# Patient Record
Sex: Male | Born: 1980 | Race: White | Hispanic: No | Marital: Married | Smoking: Never smoker
Health system: Southern US, Community
[De-identification: ages and names within clinical notes are randomized; demographics above are authoritative.]

## PROBLEM LIST (undated history)

## (undated) ENCOUNTER — Ambulatory Visit: Payer: PRIVATE HEALTH INSURANCE

## (undated) ENCOUNTER — Encounter

## (undated) ENCOUNTER — Ambulatory Visit

## (undated) ENCOUNTER — Encounter: Attending: Gastroenterology | Primary: Gastroenterology

## (undated) ENCOUNTER — Telehealth

## (undated) ENCOUNTER — Ambulatory Visit: Payer: PRIVATE HEALTH INSURANCE | Attending: Gastroenterology | Primary: Gastroenterology

## (undated) ENCOUNTER — Telehealth
Attending: Student in an Organized Health Care Education/Training Program | Primary: Student in an Organized Health Care Education/Training Program

## (undated) ENCOUNTER — Encounter: Attending: Certified Registered" | Primary: Certified Registered"

## (undated) ENCOUNTER — Ambulatory Visit: Attending: Family | Primary: Family

## (undated) ENCOUNTER — Ambulatory Visit: Attending: Internal Medicine | Primary: Internal Medicine

## (undated) DIAGNOSIS — K760 Fatty (change of) liver, not elsewhere classified: Secondary | ICD-10-CM

## (undated) DIAGNOSIS — K219 Gastro-esophageal reflux disease without esophagitis: Secondary | ICD-10-CM

## (undated) DIAGNOSIS — K7460 Unspecified cirrhosis of liver: Secondary | ICD-10-CM

## (undated) DIAGNOSIS — E669 Obesity, unspecified: Secondary | ICD-10-CM

## (undated) DIAGNOSIS — E119 Type 2 diabetes mellitus without complications: Secondary | ICD-10-CM

## (undated) DIAGNOSIS — K8020 Calculus of gallbladder without cholecystitis without obstruction: Secondary | ICD-10-CM

## (undated) HISTORY — DX: Calculus of gallbladder without cholecystitis without obstruction: K80.20

## (undated) HISTORY — DX: Fatty (change of) liver, not elsewhere classified: K76.0

## (undated) HISTORY — PX: BACK SURGERY: SHX140

## (undated) HISTORY — DX: Obesity, unspecified: E66.9

## (undated) HISTORY — PX: CHOLECYSTECTOMY: SHX55

## (undated) HISTORY — DX: Type 2 diabetes mellitus without complications: E11.9

## (undated) HISTORY — DX: Gastro-esophageal reflux disease without esophagitis: K21.9

---

## 2015-10-13 ENCOUNTER — Emergency Department (HOSPITAL_COMMUNITY): Payer: 59

## 2015-10-13 ENCOUNTER — Emergency Department (HOSPITAL_COMMUNITY)
Admission: EM | Admit: 2015-10-13 | Discharge: 2015-10-13 | Disposition: A | Discharge status: Home / Self Care | Payer: 59 | Attending: Emergency Medicine | Admitting: Emergency Medicine

## 2015-10-13 ENCOUNTER — Encounter (HOSPITAL_COMMUNITY): Payer: Self-pay | Admitting: Emergency Medicine

## 2015-10-13 DIAGNOSIS — J159 Unspecified bacterial pneumonia: Secondary | ICD-10-CM | POA: Insufficient documentation

## 2015-10-13 DIAGNOSIS — R05 Cough: Secondary | ICD-10-CM | POA: Diagnosis present

## 2015-10-13 DIAGNOSIS — Z79899 Other long term (current) drug therapy: Secondary | ICD-10-CM | POA: Diagnosis not present

## 2015-10-13 DIAGNOSIS — J189 Pneumonia, unspecified organism: Secondary | ICD-10-CM

## 2015-10-13 LAB — COMPREHENSIVE METABOLIC PANEL
ALBUMIN: 4.5 g/dL (ref 3.5–5.0)
ALT: 54 U/L (ref 17–63)
ANION GAP: 9 (ref 5–15)
AST: 47 U/L — AB (ref 15–41)
Alkaline Phosphatase: 89 U/L (ref 38–126)
BILIRUBIN TOTAL: 1 mg/dL (ref 0.3–1.2)
BUN: 14 mg/dL (ref 6–20)
CO2: 22 mmol/L (ref 22–32)
Calcium: 9.5 mg/dL (ref 8.9–10.3)
Chloride: 103 mmol/L (ref 101–111)
Creatinine, Ser: 0.93 mg/dL (ref 0.61–1.24)
GFR calc Af Amer: >60 mL/min (ref >60)
GFR calc non Af Amer: >60 mL/min (ref >60)
GLUCOSE: 149 mg/dL — AB (ref 65–99)
POTASSIUM: 4 mmol/L (ref 3.5–5.1)
SODIUM: 134 mmol/L — AB (ref 135–145)
Total Protein: 8.3 g/dL — ABNORMAL HIGH (ref 6.5–8.1)

## 2015-10-13 LAB — CBC WITH DIFFERENTIAL/PLATELET
BASOS ABS: 0 10*3/uL (ref 0.0–0.1)
BASOS PCT: 0 %
EOS ABS: 0.1 10*3/uL (ref 0.0–0.7)
Eosinophils Relative: 1 %
HEMATOCRIT: 47.7 % (ref 39.0–52.0)
HEMOGLOBIN: 16.9 g/dL (ref 13.0–17.0)
Lymphocytes Relative: 10 %
Lymphs Abs: 1.1 10*3/uL (ref 0.7–4.0)
MCH: 31 pg (ref 26.0–34.0)
MCHC: 35.4 g/dL (ref 30.0–36.0)
MCV: 87.5 fL (ref 78.0–100.0)
MONO ABS: 1.1 10*3/uL — AB (ref 0.1–1.0)
MONOS PCT: 11 %
NEUTROS ABS: 8.2 10*3/uL — AB (ref 1.7–7.7)
NEUTROS PCT: 78 %
Platelets: 219 10*3/uL (ref 150–400)
RBC: 5.45 MIL/uL (ref 4.22–5.81)
RDW: 12.7 % (ref 11.5–15.5)
WBC: 10.5 10*3/uL (ref 4.0–10.5)

## 2015-10-13 LAB — URINALYSIS, ROUTINE W REFLEX MICROSCOPIC
Bilirubin Urine: NEGATIVE
GLUCOSE, UA: NEGATIVE mg/dL
Hgb urine dipstick: NEGATIVE
Ketones, ur: NEGATIVE mg/dL
LEUKOCYTES UA: NEGATIVE
NITRITE: NEGATIVE
PH: 6 (ref 5.0–8.0)
Protein, ur: NEGATIVE mg/dL
SPECIFIC GRAVITY, URINE: 1.016 (ref 1.005–1.030)

## 2015-10-13 LAB — D-DIMER, QUANTITATIVE: D-Dimer, Quant: 0.54 ug/mL-FEU — ABNORMAL HIGH (ref 0.00–0.50)

## 2015-10-13 LAB — LIPASE, BLOOD: Lipase: 36 U/L (ref 11–51)

## 2015-10-13 MED ORDER — KETOROLAC TROMETHAMINE 30 MG/ML IJ SOLN
30.0000 mg | Freq: Once | INTRAMUSCULAR | Status: AC
  Administered 2015-10-13: 30 mg via INTRAVENOUS
  Filled 2015-10-13: qty 1

## 2015-10-13 MED ORDER — LEVOFLOXACIN 750 MG PO TABS
750.0000 mg | ORAL_TABLET | Freq: Once | ORAL | Status: AC
  Administered 2015-10-13: 750 mg via ORAL
  Filled 2015-10-13: qty 1

## 2015-10-13 MED ORDER — LEVOFLOXACIN 500 MG PO TABS: 500.0000 mg | ORAL_TABLET | Freq: Every day | ORAL | Status: DC

## 2015-10-13 MED ORDER — IOHEXOL 350 MG/ML SOLN
100.0000 mL | Freq: Once | INTRAVENOUS | Status: AC | PRN
  Administered 2015-10-13: 100 mL via INTRAVENOUS

## 2015-10-13 NOTE — ED Notes (Addendum)
Pt from PCP due to right abdominal pain and right shoulder pain that started yesterday with "a dull ache in my shoulder that got progressively". "My side hurts when I take a deep breath".  Denies n/v/d. Pt ambulatory and in NAD.

## 2015-10-13 NOTE — Discharge Instructions (Signed)
Community-Acquired Pneumonia, Adult Pneumonia is an infection of the lungs. There are different types of pneumonia. One type can develop while a person is in a hospital. A different type, called community-acquired pneumonia, develops in people who are not, or have not recently been, in the hospital or other health care facility.  CAUSES Pneumonia may be caused by bacteria, viruses, or funguses. Community-acquired pneumonia is often caused by Streptococcus pneumonia bacteria. These bacteria are often passed from one person to another by breathing in droplets from the cough or sneeze of an infected person. RISK FACTORS The condition is more likely to develop in:  People who havechronic diseases, such as chronic obstructive pulmonary disease (COPD), asthma, congestive heart failure, cystic fibrosis, diabetes, or kidney disease.  People who haveearly-stage or late-stage HIV.  People who havesickle cell disease.  People who havehad their spleen removed (splenectomy).  People who havepoor Human resources officer.  People who havemedical conditions that increase the risk of breathing in (aspirating) secretions their own mouth and nose.   People who havea weakened immune system (immunocompromised).  People who smoke.  People whotravel to areas where pneumonia-causing germs commonly exist.  People whoare around animal habitats or animals that have pneumonia-causing germs, including birds, bats, rabbits, cats, and farm animals. SYMPTOMS Symptoms of this condition include:  Adry cough.  A wet (productive) cough.  Fever.  Sweating.  Chest pain, especially when breathing deeply or coughing.  Rapid breathing or difficulty breathing.  Shortness of breath.  Shaking chills.  Fatigue.  Muscle aches. DIAGNOSIS Your health care provider will take a medical history and perform a physical exam. You may also have other tests, including:  Imaging studies of your chest, including  X-rays.  Tests to check your blood oxygen level and other blood gases.  Other tests on blood, mucus (sputum), fluid around your lungs (pleural fluid), and urine. If your pneumonia is severe, other tests may be done to identify the specific cause of your illness. TREATMENT The type of treatment that you receive depends on many factors, such as the cause of your pneumonia, the medicines you take, and other medical conditions that you have. For most adults, treatment and recovery from pneumonia may occur at home. In some cases, treatment must happen in a hospital. Treatment may include:  Antibiotic medicines, if the pneumonia was caused by bacteria.  Antiviral medicines, if the pneumonia was caused by a virus.  Medicines that are given by mouth or through an IV tube.  Oxygen.  Respiratory therapy. Although rare, treating severe pneumonia may include:  Mechanical ventilation. This is done if you are not breathing well on your own and you cannot maintain a safe blood oxygen level.  Thoracentesis. This procedureremoves fluid around one lung or both lungs to help you breathe better. HOME CARE INSTRUCTIONS  Take over-the-counter and prescription medicines only as told by your health care provider.  Only takecough medicine if you are losing sleep. Understand that cough medicine can prevent your body's natural ability to remove mucus from your lungs.  If you were prescribed an antibiotic medicine, take it as told by your health care provider. Do not stop taking the antibiotic even if you start to feel better.  Sleep in a semi-upright position at night. Try sleeping in a reclining chair, or place a few pillows under your head.  Do not use tobacco products, including cigarettes, chewing tobacco, and e-cigarettes. If you need help quitting, ask your health care provider.  Drink enough water to keep your urine  clear or pale yellow. This will help to thin out mucus secretions in your  lungs. PREVENTION There are ways that you can decrease your risk of developing community-acquired pneumonia. Consider getting a pneumococcal vaccine if:  You are older than 36 years of age.  You are older than 36 years of age and are undergoing cancer treatment, have chronic lung disease, or have other medical conditions that affect your immune system. Ask your health care provider if this applies to you. There are different types and schedules of pneumococcal vaccines. Ask your health care provider which vaccination option is best for you. You may also prevent community-acquired pneumonia if you take these actions:  Get an influenza vaccine every year. Ask your health care provider which type of influenza vaccine is best for you.  Go to the dentist on a regular basis.  Wash your hands often. Use hand sanitizer if soap and water are not available. SEEK MEDICAL CARE IF:  You have a fever.  You are losing sleep because you cannot control your cough with cough medicine. SEEK IMMEDIATE MEDICAL CARE IF:  You have worsening shortness of breath.  You have increased chest pain.  Your sickness becomes worse, especially if you are an older adult or have a weakened immune system.  You cough up blood.   This information is not intended to replace advice given to you by your health care provider. Make sure you discuss any questions you have with your health care provider.   Document Released: 10/23/2005 Document Revised: 07/14/2015 Document Reviewed: 02/17/2015 Elsevier Interactive Patient Education 2016 [employer].  Chartered certified accountant Patient Education [employer].

## 2015-10-13 NOTE — ED Provider Notes (Signed)
CSN: [id number]     Arrival date & time 10/13/15  1044 History   First MD Initiated Contact with Patient 10/13/15 1231     No chief complaint on file.     HPI  Patient presents evaluation of right anterolateral inferior chest pain. Started having some pain yesterday in his shoulder. He has pleuritic pain and states it hurts to cough breathe or move. Has had a cough and had a cold-like illness last week. No fevers. Dry nonproductive cough. No hemoptysis. History of heart disease. No risk factors or history of pulmonary embolus. Does not have food intolerance or right upper quadrant pain.  History reviewed. No pertinent past medical history. History reviewed. No pertinent past surgical history. No family history on file. Social History  Substance Use Topics  . Smoking status: Never Smoker   . Smokeless tobacco: None  . Alcohol Use: Yes    Review of Systems  Constitutional: Negative for fever, chills, diaphoresis, appetite change and fatigue.  HENT: Negative for mouth sores, sore throat and trouble swallowing.   Eyes: Negative for visual disturbance.  Respiratory: Negative for cough, chest tightness, shortness of breath and wheezing.   Cardiovascular: Positive for chest pain.  Gastrointestinal: Negative for nausea, vomiting, abdominal pain, diarrhea and abdominal distention.  Endocrine: Negative for polydipsia, polyphagia and polyuria.  Genitourinary: Negative for dysuria, frequency and hematuria.  Musculoskeletal: Negative for gait problem.  Skin: Negative for color change, pallor and rash.  Neurological: Negative for dizziness, syncope, light-headedness and headaches.  Hematological: Does not bruise/bleed easily.  Psychiatric/Behavioral: Negative for behavioral problems and confusion.      Allergies  Review of patient's allergies indicates no known allergies.  Home Medications   Prior to Admission medications   Medication Sig Start Date End Date Taking? Authorizing  Provider  ibuprofen (ADVIL,MOTRIN) 200 MG tablet Take 400 mg by mouth every 6 (six) hours as needed for headache, mild pain or moderate pain.   Yes Historical Provider, MD  Multiple Vitamins-Minerals (MULTIVITAMIN ADULT) TABS Take 1 tablet by mouth daily.   Yes Historical Provider, MD  levofloxacin (LEVAQUIN) 500 MG tablet Take 1 tablet (500 mg total) by mouth daily. 10/13/15   Tanna Furry, MD   BP 131/81 mmHg  Pulse 79  Temp(Src) 99.4 F (37.4 C) (Oral)  Resp 15  Ht 5\' 8"  (1.727 m)  Wt 259 lb (117.482 kg)  BMI 39.39 kg/m2  SpO2 98% Physical Exam  Constitutional: He is oriented to person, place, and time. He appears well-developed and well-nourished. No distress.  HENT:  Head: Normocephalic.  Eyes: Conjunctivae are normal. Pupils are equal, round, and reactive to light. No scleral icterus.  Neck: Normal range of motion. Neck supple. No thyromegaly present.  Cardiovascular: Normal rate and regular rhythm.  Exam reveals no gallop and no friction rub.   No murmur heard. Pulmonary/Chest: Effort normal and breath sounds normal. No respiratory distress. He has no wheezes. He has no rales.    Abdominal: Soft. Bowel sounds are normal. He exhibits no distension. There is no tenderness. There is no rebound.    Musculoskeletal: Normal range of motion.  Neurological: He is alert and oriented to person, place, and time.  Skin: Skin is warm and dry. No rash noted.  Psychiatric: He has a normal mood and affect. His behavior is normal.    ED Course  Procedures (including critical care time) Labs Review Labs Reviewed  CBC WITH DIFFERENTIAL/PLATELET - Abnormal; Notable for the following:    Neutro Abs 8.2 (*)  Monocytes Absolute 1.1 (*)    All other components within normal limits  COMPREHENSIVE METABOLIC PANEL - Abnormal; Notable for the following:    Sodium 134 (*)    Glucose, Bld 149 (*)    Total Protein 8.3 (*)    AST 47 (*)    All other components within normal limits  D-DIMER,  QUANTITATIVE (NOT AT [facility]) - Abnormal; Notable for the following:    D-Dimer, Quant 0.54 (*)    All other components within normal limits  LIPASE, BLOOD  URINALYSIS, ROUTINE W REFLEX MICROSCOPIC (NOT AT [facility])    Imaging Review Dg Chest 2 View  10/13/2015  CLINICAL DATA:  Right-sided chest pain for 1 day.  No known injury. EXAM: CHEST  2 VIEW COMPARISON:  None. FINDINGS: The heart size and mediastinal contours are within normal limits. Both lungs are clear. The visualized skeletal structures are unremarkable. IMPRESSION: No active cardiopulmonary disease. Electronically Signed   By: Staci Righter M.D.   On: 10/13/2015 13:51   Ct Angio Chest Pe W/cm &/or Wo Cm  10/13/2015  CLINICAL DATA:  Abdominal pain. RIGHT shoulder pain which began yesterday. Dull ache in RIGHT shoulder. EXAM: CT ANGIOGRAPHY CHEST WITH CONTRAST TECHNIQUE: Multidetector CT imaging of the chest was performed using the standard protocol during bolus administration of intravenous contrast. Multiplanar CT image reconstructions and MIPs were obtained to evaluate the vascular anatomy. CONTRAST:  171mL OMNIPAQUE IOHEXOL 350 MG/ML SOLN COMPARISON:  Chest radiograph earlier today. FINDINGS: Mediastinum: No filling defects are noted within the pulmonary arterial tree to suggest pulmonary emboli. Heart size is normal. No pericardial fluid, thickening or calcification. No acute abnormality of the thoracic aorta or other great vessels of the mediastinum. No pathologically enlarged mediastinal or hilar lymph nodes. The esophagus is normal in appearance. Lungs/Pleura: Asymmetric pulmonary opacity at the RIGHT lower lobe, early consolidation, and slight atelectasis, with small effusion, suspicious for RIGHT lower lobe pneumonia. Upper Abdomen: Suspected gallstones in the neck of the gallbladder. No other concerning intra-abdominal findings. Musculoskeletal: No aggressive appearing lytic or blastic lesions are noted in the visualized portions of the  skeleton. 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 Review of the MIP images confirms the above findings. IMPRESSION: No evidence for pulmonary emboli. Asymmetric pulmonary opacity RIGHT lower lobe with small effusion. In the appropriate clinical setting, RIGHT lower lobe pneumonia should be considered. Electronically Signed   By: Staci Righter M.D.   On: 10/13/2015 14:49   I have personally reviewed and evaluated these images and lab results as part of my medical decision-making.   EKG Interpretation None      MDM   Final diagnoses:  CAP (community acquired pneumonia)    CTA does not show clot.  Does show right lower lobe pneumonia with small effusion. This is in the area of his discomfort. He is not febrile, tachycardic, or hypoxemic. Appropriate for discharge. Given by mouth Levaquin. Prescriptions for Vicodin, ibuprofen, Levaquin. Discharged home.    Tanna Furry, MD 10/13/15 1538

## 2015-10-13 NOTE — ED Notes (Signed)
PT DISCHARGED. INSTRUCTIONS AND PRESCRIPTION GIVEN. AAOX3. PT IN NO APPARENT DISTRESS. THE OPPORTUNITY TO ASK QUESTIONS WAS PROVIDED. 

## 2015-10-27 ENCOUNTER — Other Ambulatory Visit: Payer: Self-pay | Admitting: Surgery

## 2015-10-27 NOTE — H&P (Signed)
Colin Cooley. Colin Cooley 10/27/2015 8:48 AM Location: [facility] Patient #: [id number] DOB: [birth year] Married / Language: English / Race: White Male  History of Present Illness Colin Hector MD; 10/27/2015 9:41 AM) The patient is a 34 year old male who presents for evaluation of gall stones. Note for "Gall stones": Patient sent from his primary care physician Colin Cooley for concern of RIGHT shoulder and RIGHT upper quadrant abdominal pain and gallstones.  Pleasant overweight male. Comes today with his wife. Has had intermittent shoulder pain for Shelver years without any true etiology. More recently had some more intense shoulder pain and RIGHT upper quadrant abdominal pain. Versed attack was very severe. Not even better with oxycodone. Was concerned. Saw his primary care physician. Had evaluation. CT scan of the chest done in the emergency room. The at the Cooley of the RIGHT lung. Gallstones noted. Still oral antibiotics. He still is getting some attacks. It's hard to know for certain what triggers disease. He has noticed episodes that he'll eat. He felt a little better for a few minutes and then start feeling much worse. Anal ready to his back. Instead eat a lot of fast food and heavy meals. Had episodes of nausea with this but no emesis.  He denies any heartburn or reflux. Never really tried any Tums or Rolaids. No problems with specific acid foods. As a bowel movement every day. That was after he started his attacks. He did not get sick. He is not travel outside the country. He occasionally takes ibuprofen for some mild back pain issues but nothing severe intense. He can walk several miles without any difficulty. No problems with productive cough. No history of asthma.  No personal nor family history of GI/colon cancer, inflammatory bowel disease, irritable bowel syndrome, allergy such as Celiac Sprue, dietary/dairy problems, colitis, ulcers nor gastritis. No recent  sick contacts/gastroenteritis. No travel outside the country. No changes in diet. No dysphagia to solids or liquids. No significant heartburn or reflux. No hematochezia, hematemesis, coffee ground emesis. No evidence of prior gastric/peptic ulceration.  Results for Colin Cooley (MRN [id number]) as of 10/27/2015 09:40 Ref. Range 10/13/2015 11:35 10/13/2015 11:44 Sodium Latest Ref Range: 135-145 mmol/L 134 (L) Potassium Latest Ref Range: 3.5-5.1 mmol/L 4.0 Chloride Latest Ref Range: 101-111 mmol/L 103 CO2 Latest Ref Range: 22-32 mmol/L 22 BUN Latest Ref Range: 6-20 mg/dL 14 Creatinine Latest Ref Range: 0.61-1.24 mg/dL 0.93 Calcium Latest Ref Range: 8.9-10.3 mg/dL 9.5 EGFR (Non-African Amer.) Latest Ref Range: >60 mL/min >60 EGFR (African American) Latest Ref Range: >60 mL/min >60 Glucose Latest Ref Range: 65-99 mg/dL 149 (H) Anion gap Latest Ref Range: 5-15 9 Alkaline Phosphatase Latest Ref Range: 38-126 U/L 89 Albumin Latest Ref Range: 3.5-5.0 g/dL 4.5 Lipase Latest Ref Range: 11-51 U/L 36 AST Latest Ref Range: 15-41 U/L 47 (H) ALT Latest Ref Range: 17-63 U/L 54 Total Protein Latest Ref Range: 6.5-8.1 g/dL 8.3 (H) Total Bilirubin Latest Ref Range: 0.3-1.2 mg/dL 1.0 WBC Latest Ref Range: 4.0-10.5 K/uL 10.5 RBC Latest Ref Range: 4.22-5.81 MIL/uL 5.45 Hemoglobin Latest Ref Range: 13.0-17.0 g/dL 16.9 HCT Latest Ref Range: 39.0-52.0 % 47.7 MCV Latest Ref Range: 78.0-100.0 fL 87.5 MCH Latest Ref Range: 26.0-34.0 pg 31.0 MCHC Latest Ref Range: 30.0-36.0 g/dL 35.4 RDW Latest Ref Range: 11.5-15.5 % 12.7 Platelets Latest Ref Range: 150-400 K/uL 219 Neutrophils Latest Units: % 78 Lymphocytes Latest Units: % 10 Monocytes Relative Latest Units: % 11 Eosinophil Latest Units: % 1 Basophil Latest Units: % 0 NEUT# Latest Ref  Range: 1.7-7.7 K/uL 8.2 (H) Lymphocyte # Latest Ref Range: 0.7-4.0 K/uL 1.1 Monocyte # Latest Ref Range: 0.1-1.0 K/uL 1.1 (H) Eosinophils Absolute Latest Ref Range:  0.0-0.7 K/uL 0.1 Basophils Absolute Latest Ref Range: 0.0-0.1 K/uL 0.0 D-Dimer, Quant Latest Ref Range: 0.00-0.50 ug/mL-FEU 0.54 (H) Appearance Latest Ref Range: CLEAR CLEAR Bilirubin Urine Latest Ref Range: NEGATIVE NEGATIVE Color, Urine Latest Ref Range: YELLOW YELLOW Glucose Latest Ref Range: NEGATIVE mg/dL NEGATIVE Hgb urine dipstick Latest Ref Range: NEGATIVE NEGATIVE Ketones, ur Latest Ref Range: NEGATIVE mg/dL NEGATIVE Leukocytes, UA Latest Ref Range: NEGATIVE NEGATIVE Nitrite Latest Ref Range: NEGATIVE NEGATIVE pH Latest Ref Range: 5.0-8.0 6.0 Protein Latest Ref Range: NEGATIVE mg/dL NEGATIVE Specific Gravity, Urine Latest Ref Range: 1.005-1.030 1.016   CLINICAL DATA: Abdominal pain. RIGHT shoulder pain which began yesterday. Dull ache in RIGHT shoulder.  EXAM: CT ANGIOGRAPHY CHEST WITH CONTRAST  TECHNIQUE: Multidetector CT imaging of the chest was performed using the standard protocol during bolus administration of intravenous contrast. Multiplanar CT image reconstructions and MIPs were obtained to evaluate the vascular anatomy.  CONTRAST: 153m OMNIPAQUE IOHEXOL 350 MG/ML SOLN  COMPARISON: Chest radiograph earlier today.  FINDINGS: Mediastinum: No filling defects are noted within the pulmonary arterial tree to suggest pulmonary emboli. Heart size is normal. No pericardial fluid, thickening or calcification. No acute abnormality of the thoracic aorta or other great vessels of the mediastinum. No pathologically enlarged mediastinal or hilar lymph nodes. The esophagus is normal in appearance.  Lungs/Pleura: Asymmetric pulmonary opacity at the RIGHT lower lobe, early consolidation, and slight atelectasis, with small effusion, suspicious for RIGHT lower lobe pneumonia.  Upper Abdomen: Suspected gallstones in the neck of the gallbladder. No other concerning intra-abdominal findings.  Musculoskeletal: No aggressive appearing lytic or blastic lesions are noted in  the visualized portions of the skeleton. 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  Review of the MIP images confirms the above findings.  IMPRESSION: No evidence for pulmonary emboli.  Asymmetric pulmonary opacity RIGHT lower lobe with small effusion. In the appropriate clinical setting, RIGHT lower lobe pneumonia should be considered.   Electronically Signed By: JStaci RighterM.D. On: 10/13/2015 14:49   Other Problems (Elbert Ewings CMA; 10/27/2015 8:48 AM) Back Pain  Past Surgical History (Elbert Ewings CMA; 10/27/2015 8:48 AM) Spinal Surgery - Lower Back  Diagnostic Studies History (Elbert Ewings [place] 10/27/2015 8:48 AM) Colonoscopy never  Allergies (Elbert Ewings CMA; 10/27/2015 8:48 AM) No Known Drug Allergies 10/27/2015  Medication History (Elbert Ewings CMA; 10/27/2015 8:49 AM) No Current Medications Medications Reconciled  Social History (Elbert Ewings CMA; 10/27/2015 8:48 AM) Alcohol use Occasional alcohol use. Caffeine use Carbonated beverages, Coffee. No drug use Tobacco use Never smoker.  Family History (Elbert Ewings [place] 10/27/2015 8:48 AM) Alcohol Abuse Father. Cancer Brother. Diabetes Mellitus Father. Heart Disease Father. Hypertension Brother, Father, Mother.     Review of Systems (Elbert EwingsCMA; 10/27/2015 8:48 AM) General Present- Appetite Loss. Not Present- Chills, Fatigue, Fever, Night Sweats, Weight Gain and Weight Loss. Skin Not Present- Change in Wart/Mole, Dryness, Hives, Jaundice, New Lesions, Non-Healing Wounds, Rash and Ulcer. HEENT Not Present- Earache, Hearing Loss, Hoarseness, Nose Bleed, Oral Ulcers, Ringing in the Ears, Seasonal Allergies, Sinus Pain, Sore Throat, Visual Disturbances, Wears glasses/contact lenses and Yellow Eyes. Respiratory Not Present- Bloody sputum, Chronic Cough, Difficulty Breathing, Snoring and Wheezing. Breast Not Present- Breast Mass, Breast Pain, Nipple Discharge and Skin  Changes. Cardiovascular Not Present- Chest Pain, Difficulty Breathing Lying Down, Leg Cramps, Palpitations, Rapid Heart Rate, Shortness of  Breath and Swelling of Extremities. Gastrointestinal Present- Abdominal Pain, Change in Bowel Habits, Constipation, Excessive gas, Indigestion and Nausea. Not Present- Bloating, Bloody Stool, Chronic diarrhea, Difficulty Swallowing, Gets full quickly at meals, Hemorrhoids, Rectal Pain and Vomiting. Male Genitourinary Not Present- Blood in Urine, Change in Urinary Stream, Frequency, Impotence, Nocturia, Painful Urination, Urgency and Urine Leakage. Psychiatric Not Present- Anxiety, Bipolar, Change in Sleep Pattern, Depression, Fearful and Frequent crying. Endocrine Not Present- Cold Intolerance, Excessive Hunger, Hair Changes, Heat Intolerance, Hot flashes and New Diabetes. Hematology Not Present- Easy Bruising, Excessive bleeding, Gland problems, HIV and Persistent Infections.  Vitals Elbert Ewings CMA; 10/27/2015 8:49 AM) 10/27/2015 8:49 AM Weight: 256 lb Height: 68in Body Surface Area: 2.27 m Body Mass Index: 38.92 kg/m  Temp.: 98.58F(Temporal)  Pulse: 72 (Regular)  BP: 130/70 (Sitting, Left Arm, Standard)      Physical Exam Colin Hector MD; 10/27/2015 9:34 AM)  General Mental Status-Alert. General Appearance-Not in acute distress, Not Sickly. Orientation-Oriented X3. Hydration-Well hydrated. Voice-Normal. Note: Muscular body habitus.  Integumentary Global Assessment Upon inspection and palpation of skin surfaces of the - Axillae: non-tender, no inflammation or ulceration, no drainage. and Distribution of scalp and body hair is normal. General Characteristics Temperature - normal warmth is noted.  Head and Neck Head-normocephalic, atraumatic with no lesions or palpable masses. Face Global Assessment - atraumatic, no absence of expression. Neck Global Assessment - no abnormal movements, no bruit auscultated  on the right, no bruit auscultated on the left, no decreased range of motion, non-tender. Trachea-midline. Thyroid Gland Characteristics - non-tender.  Eye Eyeball - Left-Extraocular movements intact, No Nystagmus. Eyeball - Right-Extraocular movements intact, No Nystagmus. Cornea - Left-No Hazy. Cornea - Right-No Hazy. Sclera/Conjunctiva - Left-No scleral icterus, No Discharge. Sclera/Conjunctiva - Right-No scleral icterus, No Discharge. Pupil - Left-Direct reaction to light normal. Pupil - Right-Direct reaction to light normal.  ENMT Ears Pinna - Left - no drainage observed, no generalized tenderness observed. Right - no drainage observed, no generalized tenderness observed. Nose and Sinuses External Inspection of the Nose - no destructive lesion observed. Inspection of the nares - Left - quiet respiration. Right - quiet respiration. Mouth and Throat Lips - Upper Lip - no fissures observed, no pallor noted. Lower Lip - no fissures observed, no pallor noted. Nasopharynx - no discharge present. Oral Cavity/Oropharynx - Tongue - no dryness observed. Oral Mucosa - no cyanosis observed. Hypopharynx - no evidence of airway distress observed.  Chest and Lung Exam Inspection Movements - Normal and Symmetrical. Accessory muscles - No use of accessory muscles in breathing. Palpation Palpation of the chest reveals - Non-tender. Auscultation Breath sounds - Normal and Clear.  Cardiovascular Auscultation Rhythm - Regular. Murmurs & Other Heart Sounds - Auscultation of the heart reveals - No Murmurs and No Systolic Clicks.  Abdomen Inspection Inspection of the abdomen reveals - No Visible peristalsis and No Abnormal pulsations. Umbilicus - No Bleeding, No Urine drainage. Palpation/Percussion Palpation and Percussion of the abdomen reveal - Soft, Non Tender, No Rebound tenderness, No Rigidity (guarding) and No Cutaneous hyperesthesia. Note: Overweight but soft. Mild  RIGHT upper quadrant discomfort. Not classic Murphy sign. Rest abdomen is soft. No epigastric tenderness.  Male Genitourinary Sexual Maturity Tanner 5 - Adult hair pattern and Adult penile size and shape.  Peripheral Vascular Upper Extremity Inspection - Left - No Cyanotic nailbeds, Not Ischemic. Right - No Cyanotic nailbeds, Not Ischemic.  Neurologic Neurologic evaluation reveals -normal attention span and ability to concentrate, able to name objects and repeat  phrases. Appropriate fund of knowledge , normal sensation and normal coordination. Mental Status Affect - not angry, not paranoid. Cranial Nerves-Normal Bilaterally. Gait-Normal.  Neuropsychiatric Mental status exam performed with findings of-able to articulate well with normal speech/language, rate, volume and coherence, thought content normal with ability to perform basic computations and apply abstract reasoning and no evidence of hallucinations, delusions, obsessions or homicidal/suicidal ideation.  Musculoskeletal Global Assessment Spine, Ribs and Pelvis - no instability, subluxation or laxity. Right Upper Extremity - no instability, subluxation or laxity.  Lymphatic Head & Neck  General Head & Neck Lymphatics: Bilateral - Description - No Localized lymphadenopathy. Axillary  General Axillary Region: Bilateral - Description - No Localized lymphadenopathy. Femoral & Inguinal  Generalized Femoral & Inguinal Lymphatics: Left - Description - No Localized lymphadenopathy. Right - Description - No Localized lymphadenopathy.    Assessment & Plan Colin Hector MD; 10/27/2015 9:43 AM)  CHRONIC CHOLECYSTITIS WITH CALCULUS (K80.10) Impression: Story suspicious for symptomatic gallstones. Chronic soreness suspicious for chronic cholecystitis. Rest of the differential diagnosis seems unlikely.  I think he would benefit from laparoscopic cholecystectomy. I am skeptical that he has heartburn and reflux but could do  a trial of an acid medications just in case. Doubt EGD or gastroenterology consultation will be of much use to him.  I suspect his starnding in his RIGHT lower lobe on the CT scan of his chest earlier the month was more likely secondary irritation from gallbladder. He completed his antibiotics anyway. He is still having attacks after completing antibiotics.  I do not have availability this week. Trying to see if there are any partners that do. Otherwise might have to wait until next month if he can wait.  Current Plans You are being scheduled for surgery - Our schedulers will call you.  You should hear from our office's scheduling department within 5 working days about the location, date, and time of surgery. We try to make accommodations for patient's preferences in scheduling surgery, but sometimes the OR schedule or the surgeon's schedule prevents [place] from making those accommodations.  If you have not heard from our office [phone]) in 5 working days, call the office and ask for your surgeon's nurse.  If you have other questions about your diagnosis, plan, or surgery, call the office and ask for your surgeon's nurse.  The anatomy & physiology of hepatobiliary & pancreatic function was discussed. The pathophysiology of gallbladder dysfunction was discussed. Natural history risks without surgery was discussed. I feel the risks of no intervention will lead to serious problems that outweigh the operative risks; therefore, I recommended cholecystectomy to remove the pathology. I explained laparoscopic techniques with possible need for an open approach. Probable cholangiogram to evaluate the bilary tract was explained as well.  Risks such as bleeding, infection, abscess, leak, injury to other organs, need for further treatment, heart attack, death, and other risks were discussed. I noted a good likelihood this will help address the problem. Possibility that this will not correct all abdominal  symptoms was explained. Goals of post-operative recovery were discussed as well. We will work to minimize complications. An educational handout further explaining the pathology and treatment options was given as well. Questions were answered. The patient expresses understanding & wishes to proceed with surgery.  Pt Education - Pamphlet Given - Laparoscopic Gallbladder Surgery: discussed with patient and provided information. Written instructions provided Pt Education - [facility] Laparosopic Post Op HCI (Deyna Carbon) Pt Education - [facility] Good Bowel Health (Sebastien Jackson) Pt Education - Laparoscopic Cholecystectomy:  gallbladder  Colin Cooley, M.D., F.A.C.S. Gastrointestinal and Minimally Invasive Surgery [facility] [address], [place], [place] [address] [phone] Main / Paging

## 2015-10-27 NOTE — H&P (Signed)
Sharmaine Base. Ciano 10/27/2015 8:48 AM Location: [facility] Patient #: [id number] DOB: 23-Oct-1981 Married / Language: English / Race: White Male  History of Present Illness [name] MD; 10/27/2015 10:08 AM) The patient is a 34 year old male who presents for evaluation of gall stones. Note for "Gall stones": Patient sent from his primary care physician Dr. Lindell Noe for concern of RIGHT shoulder and RIGHT upper quadrant abdominal pain and gallstones.  Pleasant overweight male. Comes today with his wife. Has had intermittent shoulder pain for Shelver years without any true etiology. More recently had some more intense shoulder pain and RIGHT upper quadrant abdominal pain. Versed attack was very severe. Not even better with oxycodone. Was concerned. Saw his primary care physician. Had evaluation. CT scan of the chest done in the emergency room. The at the base of the RIGHT lung. Gallstones noted. Still oral antibiotics. He still is getting some attacks. It's hard to know for certain what triggers disease. He has noticed episodes that he'll eat. He felt a little better for a few minutes and then start feeling much worse. Anal ready to his back. Instead eat a lot of fast food and heavy meals. Had episodes of nausea with this but no emesis.  He denies any heartburn or reflux. Never really tried any Tums or Rolaids. No problems with specific acid foods. As a bowel movement every day. That was after he started his attacks. He did not get sick. He is not travel outside the country. He occasionally takes ibuprofen for some mild back pain issues but nothing severe intense. He can walk several miles without any difficulty. No problems with productive cough. No history of asthma.  No personal nor family history of GI/colon cancer, inflammatory bowel disease, irritable bowel syndrome, allergy such as Celiac Sprue, dietary/dairy problems, colitis, ulcers nor gastritis. No  recent sick contacts/gastroenteritis. No travel outside the country. No changes in diet. No dysphagia to solids or liquids. No significant heartburn or reflux. No hematochezia, hematemesis, coffee ground emesis. No evidence of prior gastric/peptic ulceration.  Results for TREMELL, REIMERS (MRN [id number]) as of 10/27/2015 09:40 Ref. Range 10/13/2015 11:35 10/13/2015 11:44 Sodium Latest Ref Range: 135-145 mmol/L 134 (L) Potassium Latest Ref Range: 3.5-5.1 mmol/L 4.0 Chloride Latest Ref Range: 101-111 mmol/L 103 CO2 Latest Ref Range: 22-32 mmol/L 22 BUN Latest Ref Range: 6-20 mg/dL 14 Creatinine Latest Ref Range: 0.61-1.24 mg/dL 0.93 Calcium Latest Ref Range: 8.9-10.3 mg/dL 9.5 EGFR (Non-African Amer.) Latest Ref Range: >60 mL/min >60 EGFR (African American) Latest Ref Range: >60 mL/min >60 Glucose Latest Ref Range: 65-99 mg/dL 149 (H) Anion gap Latest Ref Range: 5-15 9 Alkaline Phosphatase Latest Ref Range: 38-126 U/L 89 Albumin Latest Ref Range: 3.5-5.0 g/dL 4.5 Lipase Latest Ref Range: 11-51 U/L 36 AST Latest Ref Range: 15-41 U/L 47 (H) ALT Latest Ref Range: 17-63 U/L 54 Total Protein Latest Ref Range: 6.5-8.1 g/dL 8.3 (H) Total Bilirubin Latest Ref Range: 0.3-1.2 mg/dL 1.0 WBC Latest Ref Range: 4.0-10.5 K/uL 10.5 RBC Latest Ref Range: 4.22-5.81 MIL/uL 5.45 Hemoglobin Latest Ref Range: 13.0-17.0 g/dL 16.9 HCT Latest Ref Range: 39.0-52.0 % 47.7 MCV Latest Ref Range: 78.0-100.0 fL 87.5 MCH Latest Ref Range: 26.0-34.0 pg 31.0 MCHC Latest Ref Range: 30.0-36.0 g/dL 35.4 RDW Latest Ref Range: 11.5-15.5 % 12.7 Platelets Latest Ref Range: 150-400 K/uL 219 Neutrophils Latest Units: % 78 Lymphocytes Latest Units: % 10 Monocytes Relative Latest Units: % 11 Eosinophil Latest Units: % 1 Basophil Latest Units: % 0 NEUT# Latest Ref  Range: 1.7-7.7 K/uL 8.2 (H) Lymphocyte # Latest Ref Range: 0.7-4.0 K/uL 1.1 Monocyte # Latest Ref Range: 0.1-1.0 K/uL 1.1 (H) Eosinophils Absolute Latest Ref  Range: 0.0-0.7 K/uL 0.1 Basophils Absolute Latest Ref Range: 0.0-0.1 K/uL 0.0 D-Dimer, Quant Latest Ref Range: 0.00-0.50 ug/mL-FEU 0.54 (H) Appearance Latest Ref Range: CLEAR CLEAR Bilirubin Urine Latest Ref Range: NEGATIVE NEGATIVE Color, Urine Latest Ref Range: YELLOW YELLOW Glucose Latest Ref Range: NEGATIVE mg/dL NEGATIVE Hgb urine dipstick Latest Ref Range: NEGATIVE NEGATIVE Ketones, ur Latest Ref Range: NEGATIVE mg/dL NEGATIVE Leukocytes, UA Latest Ref Range: NEGATIVE NEGATIVE Nitrite Latest Ref Range: NEGATIVE NEGATIVE pH Latest Ref Range: 5.0-8.0 6.0 Protein Latest Ref Range: NEGATIVE mg/dL NEGATIVE Specific Gravity, Urine Latest Ref Range: 1.005-1.030 1.016   CLINICAL DATA: Abdominal pain. RIGHT shoulder pain which began yesterday. Dull ache in RIGHT shoulder.  EXAM: CT ANGIOGRAPHY CHEST WITH CONTRAST  TECHNIQUE: Multidetector CT imaging of the chest was performed using the standard protocol during bolus administration of intravenous contrast. Multiplanar CT image reconstructions and MIPs were obtained to evaluate the vascular anatomy.  CONTRAST: 157m OMNIPAQUE IOHEXOL 350 MG/ML SOLN  COMPARISON: Chest radiograph earlier today.  FINDINGS: Mediastinum: No filling defects are noted within the pulmonary arterial tree to suggest pulmonary emboli. Heart size is normal. No pericardial fluid, thickening or calcification. No acute abnormality of the thoracic aorta or other great vessels of the mediastinum. No pathologically enlarged mediastinal or hilar lymph nodes. The esophagus is normal in appearance.  Lungs/Pleura: Asymmetric pulmonary opacity at the RIGHT lower lobe, early consolidation, and slight atelectasis, with small effusion, suspicious for RIGHT lower lobe pneumonia.  Upper Abdomen: Suspected gallstones in the neck of the gallbladder. No other concerning intra-abdominal findings.  Musculoskeletal: No aggressive appearing lytic or blastic lesions are  noted in the visualized portions of the skeleton. 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  Review of the MIP images confirms the above findings.  IMPRESSION: No evidence for pulmonary emboli.  Asymmetric pulmonary opacity RIGHT lower lobe with small effusion. In the appropriate clinical setting, RIGHT lower lobe pneumonia should be considered.   Electronically Signed By: JStaci RighterM.D. On: 10/13/2015 14:49   Other Problems (Elbert Ewings CMA; 10/27/2015 8:48 AM) Back Pain  Past Surgical History (Elbert Ewings CMA; 10/27/2015 8:48 AM) Spinal Surgery - Lower Back  Diagnostic Studies History (Elbert Ewings [place] 10/27/2015 8:48 AM) Colonoscopy never  Allergies (Elbert Ewings CMA; 10/27/2015 8:48 AM) No Known Drug Allergies 10/27/2015  Medication History (Elbert Ewings CMA; 10/27/2015 8:49 AM) No Current Medications Medications Reconciled  Social History (Elbert Ewings CMA; 10/27/2015 8:48 AM) Alcohol use Occasional alcohol use. Caffeine use Carbonated beverages, Coffee. No drug use Tobacco use Never smoker.  Family History (Elbert Ewings [place] 10/27/2015 8:48 AM) Alcohol Abuse Father. Cancer Brother. Diabetes Mellitus Father. Heart Disease Father. Hypertension Brother, Father, Mother.     Review of Systems (Elbert EwingsCMA; 10/27/2015 8:48 AM) General Present- Appetite Loss. Not Present- Chills, Fatigue, Fever, Night Sweats, Weight Gain and Weight Loss. Skin Not Present- Change in Wart/Mole, Dryness, Hives, Jaundice, New Lesions, Non-Healing Wounds, Rash and Ulcer. HEENT Not Present- Earache, Hearing Loss, Hoarseness, Nose Bleed, Oral Ulcers, Ringing in the Ears, Seasonal Allergies, Sinus Pain, Sore Throat, Visual Disturbances, Wears glasses/contact lenses and Yellow Eyes. Respiratory Not Present- Bloody sputum, Chronic Cough, Difficulty Breathing, Snoring and Wheezing. Breast Not Present- Breast Mass, Breast Pain, Nipple Discharge and Skin  Changes. Cardiovascular Not Present- Chest Pain, Difficulty Breathing Lying Down, Leg Cramps, Palpitations, Rapid Heart Rate, Shortness of  Breath and Swelling of Extremities. Gastrointestinal Present- Abdominal Pain, Change in Bowel Habits, Constipation, Excessive gas, Indigestion and Nausea. Not Present- Bloating, Bloody Stool, Chronic diarrhea, Difficulty Swallowing, Gets full quickly at meals, Hemorrhoids, Rectal Pain and Vomiting. Male Genitourinary Not Present- Blood in Urine, Change in Urinary Stream, Frequency, Impotence, Nocturia, Painful Urination, Urgency and Urine Leakage. Psychiatric Not Present- Anxiety, Bipolar, Change in Sleep Pattern, Depression, Fearful and Frequent crying. Endocrine Not Present- Cold Intolerance, Excessive Hunger, Hair Changes, Heat Intolerance, Hot flashes and New Diabetes. Hematology Not Present- Easy Bruising, Excessive bleeding, Gland problems, HIV and Persistent Infections.  Vitals Elbert Ewings CMA; 10/27/2015 8:49 AM) 10/27/2015 8:49 AM Weight: 256 lb Height: 68in Body Surface Area: 2.27 m Body Mass Index: 38.92 kg/m  Temp.: 98.40F(Temporal)  Pulse: 72 (Regular)  BP: 130/70 (Sitting, Left Arm, Standard)      Physical Exam [name] MD; 10/27/2015 9:34 AM)  General Mental Status-Alert. General Appearance-Not in acute distress, Not Sickly. Orientation-Oriented X3. Hydration-Well hydrated. Voice-Normal. Note: Muscular body habitus.  Integumentary Global Assessment Upon inspection and palpation of skin surfaces of the - Axillae: non-tender, no inflammation or ulceration, no drainage. and Distribution of scalp and body hair is normal. General Characteristics Temperature - normal warmth is noted.  Head and Neck Head-normocephalic, atraumatic with no lesions or palpable masses. Face Global Assessment - atraumatic, no absence of expression. Neck Global Assessment - no abnormal movements, no bruit auscultated  on the right, no bruit auscultated on the left, no decreased range of motion, non-tender. Trachea-midline. Thyroid Gland Characteristics - non-tender.  Eye Eyeball - Left-Extraocular movements intact, No Nystagmus. Eyeball - Right-Extraocular movements intact, No Nystagmus. Cornea - Left-No Hazy. Cornea - Right-No Hazy. Sclera/Conjunctiva - Left-No scleral icterus, No Discharge. Sclera/Conjunctiva - Right-No scleral icterus, No Discharge. Pupil - Left-Direct reaction to light normal. Pupil - Right-Direct reaction to light normal.  ENMT Ears Pinna - Left - no drainage observed, no generalized tenderness observed. Right - no drainage observed, no generalized tenderness observed. Nose and Sinuses External Inspection of the Nose - no destructive lesion observed. Inspection of the nares - Left - quiet respiration. Right - quiet respiration. Mouth and Throat Lips - Upper Lip - no fissures observed, no pallor noted. Lower Lip - no fissures observed, no pallor noted. Nasopharynx - no discharge present. Oral Cavity/Oropharynx - Tongue - no dryness observed. Oral Mucosa - no cyanosis observed. Hypopharynx - no evidence of airway distress observed.  Chest and Lung Exam Inspection Movements - Normal and Symmetrical. Accessory muscles - No use of accessory muscles in breathing. Palpation Palpation of the chest reveals - Non-tender. Auscultation Breath sounds - Normal and Clear.  Cardiovascular Auscultation Rhythm - Regular. Murmurs & Other Heart Sounds - Auscultation of the heart reveals - No Murmurs and No Systolic Clicks.  Abdomen Inspection Inspection of the abdomen reveals - No Visible peristalsis and No Abnormal pulsations. Umbilicus - No Bleeding, No Urine drainage. Palpation/Percussion Palpation and Percussion of the abdomen reveal - Soft, Non Tender, No Rebound tenderness, No Rigidity (guarding) and No Cutaneous hyperesthesia. Note: Overweight but soft. Mild  RIGHT upper quadrant discomfort. Not classic Murphy sign. Rest abdomen is soft. No epigastric tenderness.  Male Genitourinary Sexual Maturity Tanner 5 - Adult hair pattern and Adult penile size and shape.  Peripheral Vascular Upper Extremity Inspection - Left - No Cyanotic nailbeds, Not Ischemic. Right - No Cyanotic nailbeds, Not Ischemic.  Neurologic Neurologic evaluation reveals -normal attention span and ability to concentrate, able to name objects and repeat  phrases. Appropriate fund of knowledge , normal sensation and normal coordination. Mental Status Affect - not angry, not paranoid. Cranial Nerves-Normal Bilaterally. Gait-Normal.  Neuropsychiatric Mental status exam performed with findings of-able to articulate well with normal speech/language, rate, volume and coherence, thought content normal with ability to perform basic computations and apply abstract reasoning and no evidence of hallucinations, delusions, obsessions or homicidal/suicidal ideation.  Musculoskeletal Global Assessment Spine, Ribs and Pelvis - no instability, subluxation or laxity. Right Upper Extremity - no instability, subluxation or laxity.  Lymphatic Head & Neck  General Head & Neck Lymphatics: Bilateral - Description - No Localized lymphadenopathy. Axillary  General Axillary Region: Bilateral - Description - No Localized lymphadenopathy. Femoral & Inguinal  Generalized Femoral & Inguinal Lymphatics: Left - Description - No Localized lymphadenopathy. Right - Description - No Localized lymphadenopathy.    Assessment & Plan [name] MD; 10/27/2015 10:08 AM)  CHRONIC CHOLECYSTITIS WITH CALCULUS (K80.10) Impression: Story suspicious for symptomatic gallstones. Chronic soreness suspicious for chronic cholecystitis. Rest of the differential diagnosis seems unlikely.  I think he would benefit from laparoscopic cholecystectomy. I am skeptical that he has heartburn and reflux but could do  a trial of an acid medications just in case. Doubt EGD or gastroenterology consultation will be of much use to him.  I suspect his starnding in his RIGHT lower lobe on the CT scan of his chest earlier the month was more likely secondary irritation from gallbladder. He completed his antibiotics anyway. He is still having attacks after completing antibiotics.  I do not have availability this week. Trying to see if there are any partners that do. Dr. Greer Pickerel most likely will have availability and is willing to see him care of this patient. Otherwise might have to wait until next month if he can wait.  Current Plans You are being scheduled for surgery - Our schedulers will call you.  You should hear from our office's scheduling department within 5 working days about the location, date, and time of surgery. We try to make accommodations for patient's preferences in scheduling surgery, but sometimes the OR schedule or the surgeon's schedule prevents [place] from making those accommodations.  If you have not heard from our office [phone]) in 5 working days, call the office and ask for your surgeon's nurse.  If you have other questions about your diagnosis, plan, or surgery, call the office and ask for your surgeon's nurse.  The anatomy & physiology of hepatobiliary & pancreatic function was discussed. The pathophysiology of gallbladder dysfunction was discussed. Natural history risks without surgery was discussed. I feel the risks of no intervention will lead to serious problems that outweigh the operative risks; therefore, I recommended cholecystectomy to remove the pathology. I explained laparoscopic techniques with possible need for an open approach. Probable cholangiogram to evaluate the bilary tract was explained as well.  Risks such as bleeding, infection, abscess, leak, injury to other organs, need for further treatment, heart attack, death, and other risks were discussed. I noted a good  likelihood this will help address the problem. Possibility that this will not correct all abdominal symptoms was explained. Goals of post-operative recovery were discussed as well. We will work to minimize complications. An educational handout further explaining the pathology and treatment options was given as well. Questions were answered. The patient expresses understanding & wishes to proceed with surgery.  Pt Education - Pamphlet Given - Laparoscopic Gallbladder Surgery: discussed with patient and provided information. Written instructions provided Pt Education - [facility]  Laparosopic Post Op HCI (Lakeya Mulka) Pt Education - [facility] Good Bowel Health (Haydin Calandra) Pt Education - Laparoscopic Cholecystectomy: gallbladder  [name], M.D., F.A.C.S. Gastrointestinal and Minimally Invasive Surgery [facility] [address] [place], [place] [address] [phone] Main / Paging

## 2016-11-18 IMAGING — CT CT ANGIO CHEST
3 of 7 series · 18 of 36 positions shown · IV contrast (OMNIPAQUE 350)
Comparison: Chest radiograph earlier today.

CLINICAL DATA: Abdominal pain. RIGHT shoulder pain which began
yesterday. Dull ache in RIGHT shoulder.

EXAM:
CT ANGIOGRAPHY CHEST WITH CONTRAST
TECHNIQUE: Multidetector CT imaging of the chest was performed using the
standard protocol during bolus administration of intravenous
contrast. Multiplanar CT image reconstructions and MIPs were
obtained to evaluate the vascular anatomy.
CONTRAST:  100mL OMNIPAQUE IOHEXOL 350 MG/ML SOLN

[Series 5: coronal mpr · coronal · 0.68mm/px · 1 of 132 slices shown]
[im 66/132  mediastinal]
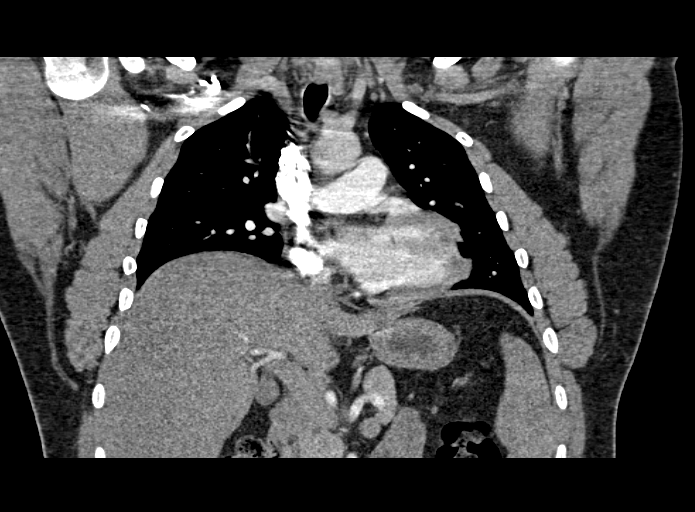

[Series 10: thins for pacs · axial · 0.84mm/px · z∈[+1410,+1648]mm · 16 of 271 slices shown]
[im 16/271  lung]
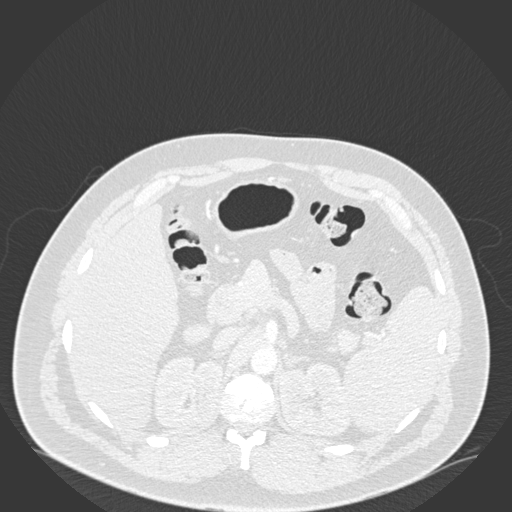
[im 32/271  mediastinal]
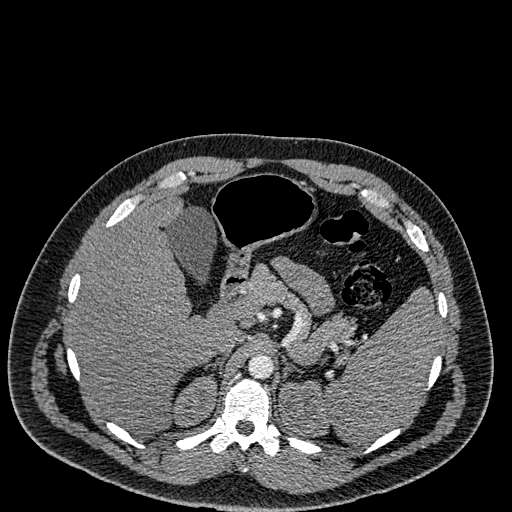
[im 48/271  lung]
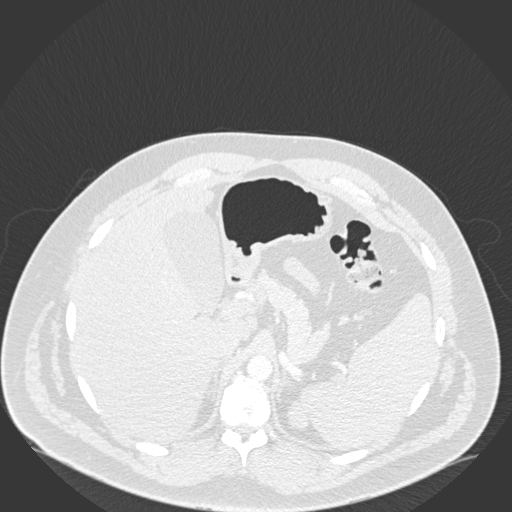
[im 64/271  mediastinal]
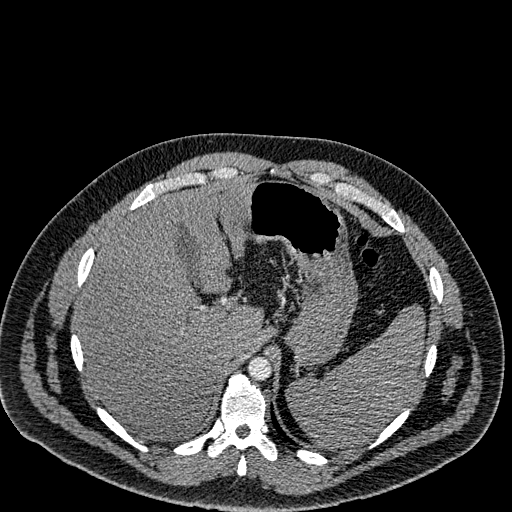
[im 80/271  lung]
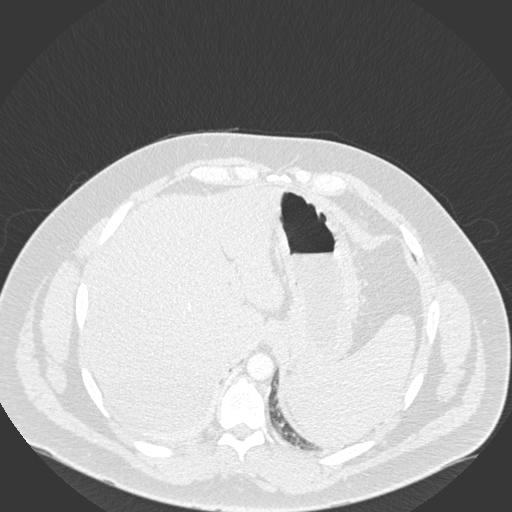
[im 96/271  mediastinal]
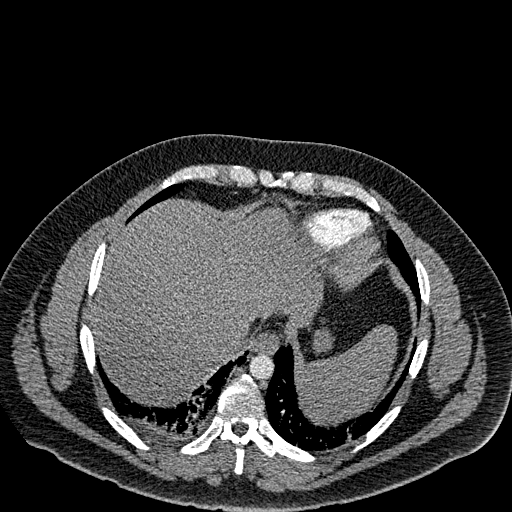
[im 112/271  lung]
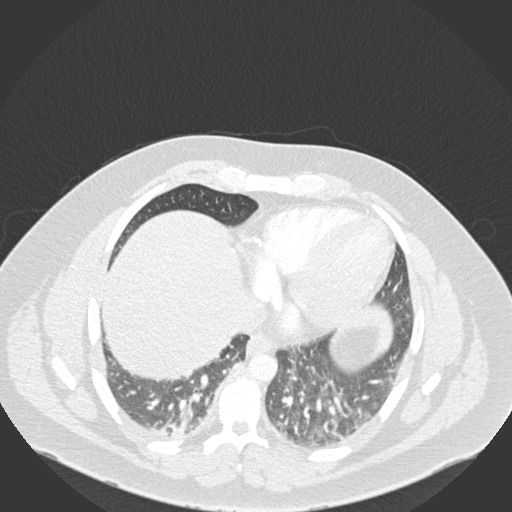
[im 128/271  mediastinal]
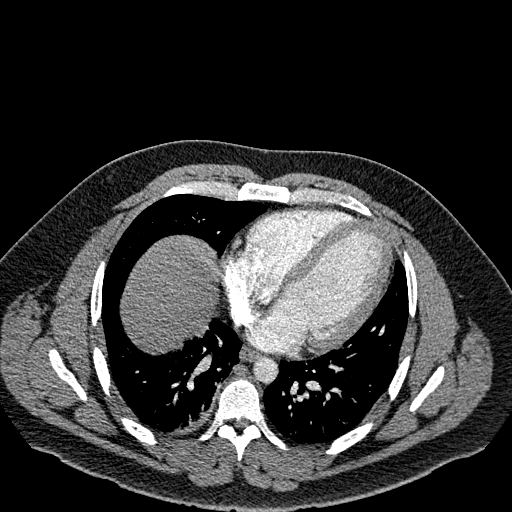
[im 143/271  lung]
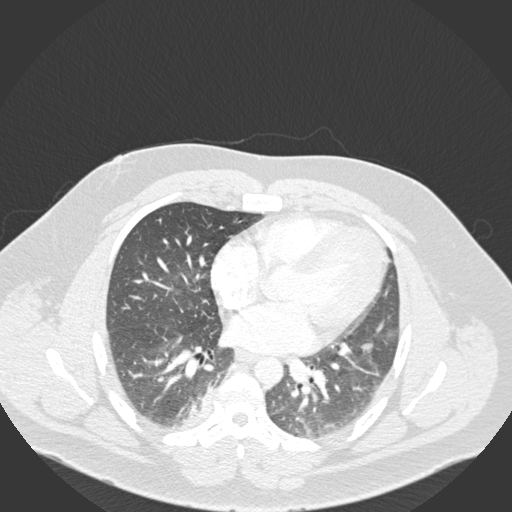
[im 159/271  mediastinal]
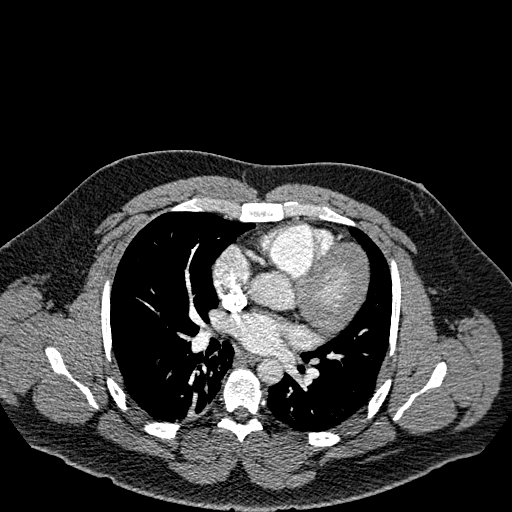
[im 175/271  lung]
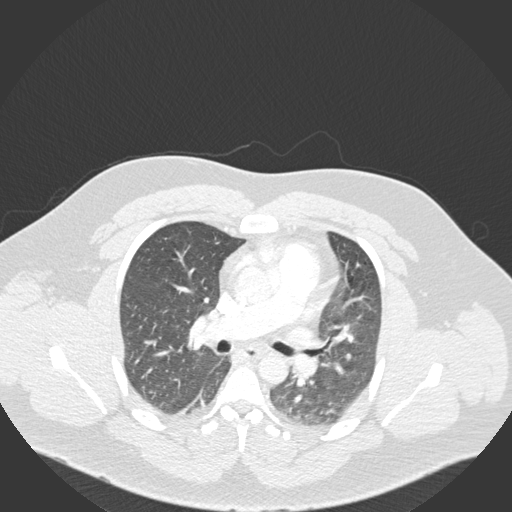
[im 191/271  mediastinal]
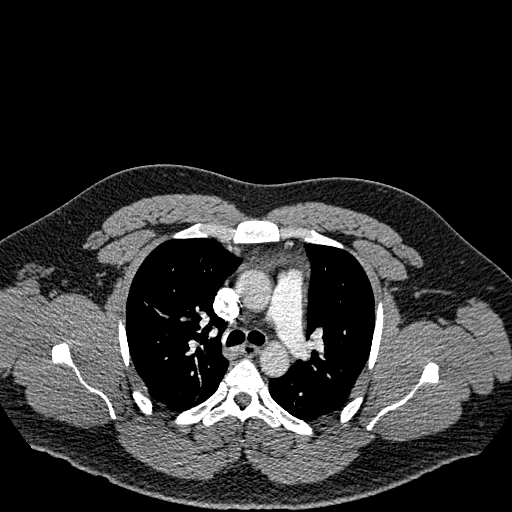
[im 207/271  lung]
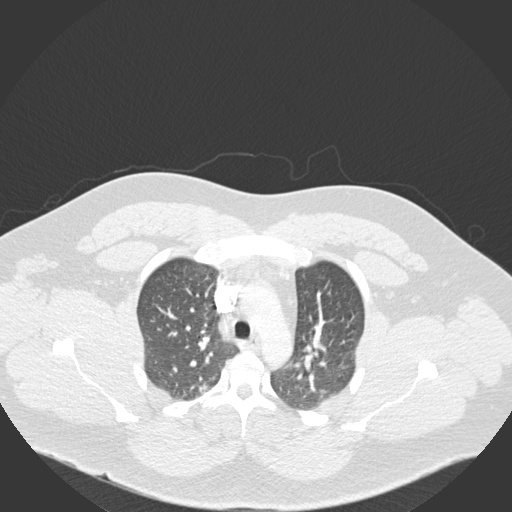
[im 223/271  mediastinal]
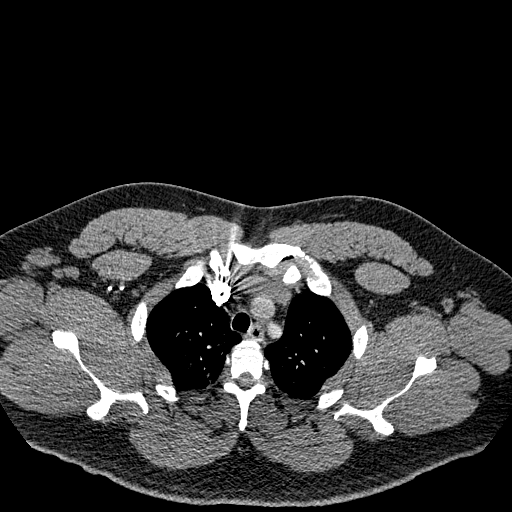
[im 239/271  lung]
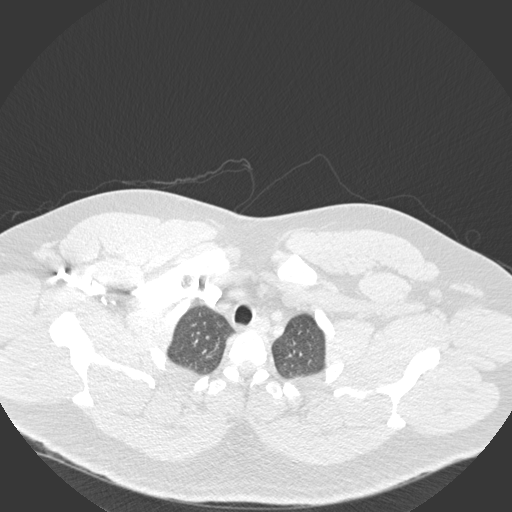
[im 255/271  mediastinal]
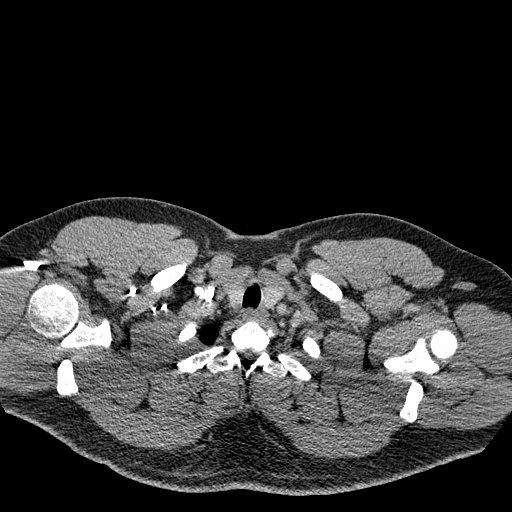

[Series 11: lung windows · axial · 0.84mm/px · 1 of 68 slices shown]
[im 23/68  mediastinal]
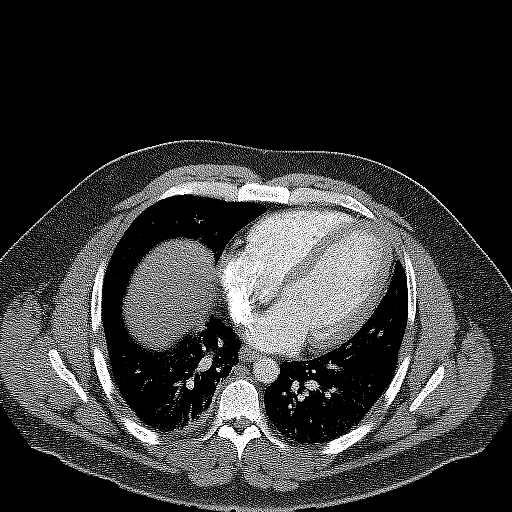

[18 of 36 positions shown; findings below may reference images not displayed]

FINDINGS: Mediastinum: No filling defects are noted within the pulmonary
arterial tree to suggest pulmonary emboli. Heart size is normal. No
pericardial fluid, thickening or calcification. No acute abnormality
of the thoracic aorta or other great vessels of the mediastinum. No
pathologically enlarged mediastinal or hilar lymph nodes. The
esophagus is normal in appearance.

Lungs/Pleura: Asymmetric pulmonary opacity at the RIGHT lower lobe,
early consolidation, and slight atelectasis, with small effusion,
suspicious for RIGHT lower lobe pneumonia.

Upper Abdomen: Suspected gallstones in the neck of the gallbladder.
No other concerning intra-abdominal findings.

Musculoskeletal: No aggressive appearing lytic or blastic lesions
are noted in the visualized portions of the skeleton. Degenerative
disc disease in the thoracic spine.

Review of the MIP images confirms the above findings.
IMPRESSION: No evidence for pulmonary emboli.

Asymmetric pulmonary opacity RIGHT lower lobe with small effusion.
In the appropriate clinical setting, RIGHT lower lobe pneumonia
should be considered.

## 2018-06-28 ENCOUNTER — Encounter: Payer: Self-pay | Admitting: Gastroenterology

## 2018-08-14 ENCOUNTER — Encounter: Payer: Self-pay | Admitting: Gastroenterology

## 2018-08-14 ENCOUNTER — Ambulatory Visit (INDEPENDENT_AMBULATORY_CARE_PROVIDER_SITE_OTHER): Payer: PRIVATE HEALTH INSURANCE | Admitting: Gastroenterology

## 2018-08-14 ENCOUNTER — Other Ambulatory Visit (INDEPENDENT_AMBULATORY_CARE_PROVIDER_SITE_OTHER): Payer: PRIVATE HEALTH INSURANCE

## 2018-08-14 VITALS — BP 122/66 | HR 70 | Ht 67.5 in | Wt 225.4 lb

## 2018-08-14 DIAGNOSIS — C220 Liver cell carcinoma: Secondary | ICD-10-CM

## 2018-08-14 DIAGNOSIS — K7460 Unspecified cirrhosis of liver: Secondary | ICD-10-CM | POA: Diagnosis not present

## 2018-08-14 DIAGNOSIS — R197 Diarrhea, unspecified: Secondary | ICD-10-CM | POA: Diagnosis not present

## 2018-08-14 DIAGNOSIS — R112 Nausea with vomiting, unspecified: Secondary | ICD-10-CM | POA: Diagnosis not present

## 2018-08-14 DIAGNOSIS — Z8719 Personal history of other diseases of the digestive system: Secondary | ICD-10-CM

## 2018-08-14 LAB — COMPREHENSIVE METABOLIC PANEL
ALBUMIN: 4.5 g/dL (ref 3.5–5.2)
ALT: 61 U/L — AB (ref 0–53)
AST: 86 U/L — AB (ref 0–37)
Alkaline Phosphatase: 133 U/L — ABNORMAL HIGH (ref 39–117)
BILIRUBIN TOTAL: 2.3 mg/dL — AB (ref 0.2–1.2)
BUN: 12 mg/dL (ref 6–23)
CO2: 26 meq/L (ref 19–32)
CREATININE: 0.85 mg/dL (ref 0.40–1.50)
Calcium: 9.5 mg/dL (ref 8.4–10.5)
Chloride: 104 mEq/L (ref 96–112)
GFR: 106.55 mL/min (ref >60.00)
Glucose, Bld: 180 mg/dL — ABNORMAL HIGH (ref 70–99)
Potassium: 3.7 mEq/L (ref 3.5–5.1)
Sodium: 137 mEq/L (ref 135–145)
Total Protein: 8.6 g/dL — ABNORMAL HIGH (ref 6.0–8.3)

## 2018-08-14 LAB — IRON: IRON: 172 ug/dL — AB (ref 42–165)

## 2018-08-14 LAB — CBC WITH DIFFERENTIAL/PLATELET
BASOS ABS: 0 10*3/uL (ref 0.0–0.1)
Basophils Relative: 0.3 % (ref 0.0–3.0)
EOS ABS: 0.3 10*3/uL (ref 0.0–0.7)
Eosinophils Relative: 4.9 % (ref 0.0–5.0)
HCT: 47.3 % (ref 39.0–52.0)
HEMOGLOBIN: 16.3 g/dL (ref 13.0–17.0)
LYMPHS PCT: 18.1 % (ref 12.0–46.0)
Lymphs Abs: 1.2 10*3/uL (ref 0.7–4.0)
MCHC: 34.5 g/dL (ref 30.0–36.0)
MCV: 93.5 fl (ref 78.0–100.0)
MONO ABS: 0.6 10*3/uL (ref 0.1–1.0)
Monocytes Relative: 9.5 % (ref 3.0–12.0)
Neutro Abs: 4.6 10*3/uL (ref 1.4–7.7)
Neutrophils Relative %: 67.2 % (ref 43.0–77.0)
Platelets: 192 10*3/uL (ref 150.0–400.0)
RBC: 5.06 Mil/uL (ref 4.22–5.81)
RDW: 14.1 % (ref 11.5–15.5)
WBC: 6.8 10*3/uL (ref 4.0–10.5)

## 2018-08-14 LAB — PROTIME-INR
INR: 1.1 ratio — ABNORMAL HIGH (ref 0.8–1.0)
Prothrombin Time: 13.4 s — ABNORMAL HIGH (ref 9.6–13.1)

## 2018-08-14 LAB — FERRITIN: FERRITIN: 280.1 ng/mL (ref 22.0–322.0)

## 2018-08-14 NOTE — Progress Notes (Signed)
HPI: This is a very pleasant 37 year old man who was referred by Dr. Osborne Casco  Chief complaint is fatty liver, cirrhosis  He underwent laparoscopic cholecystectomy April 2019 for symptomatic gallstones.  At the time of the surgery his liver was noted to be abnormal and the surgeon performed a liver biopsy.  The liver biopsy suggested "early cirrhosis".  See the pathology report below.  Was told about fatty liver many years ago.  He has been as heavy as 260 pounds, currently he is 220, that 40 pound weight loss is over about a 2-year span.  Never really etoh abuser  He has never had hepatitis or jaundice that he is aware of.  FH; brother and aunt with fatty liver.   He does have intermittent dyspepsia, nausea, vomiting and diarrhea episodes that occur about once every 6 months.  They thought this might be related to his gallbladder.  He had not had an attack like this since his gallbladder was removed until just 3 or 4 days ago.  He is recovering from it as he usually does.  He cannot point to any particularly foods or medicines that contribute.  Old Data Reviewed:   ACCESSION NUMBER: [id number] RECEIVED: 02/14/2018 ORDERING PHYSICIAN: Demetrius Revel , MD PATIENT NAME: Cooley, Colin SURGICAL PATHOLOGY REPORT  FINAL PATHOLOGIC DIAGNOSIS  A. GALLBLADDER, REMOVAL:  CHRONIC CHOLECYSTITIS.  CHOLESTEROLOSIS.  CHOLELITHIASIS.  LYMPH NODE WITH LIPOGRANULOMATOUS REACTION.  B. LIVER, BIOPSY:  LIVER PARENCHYMA WITH BRIDGING PORTAL TO PORTAL FIBROSIS CONSISTENT WITH EARLY CIRRHOSIS.  NO EVIDENCE OF MALIGNANCY.    CPT 304; 307; 313 x5   COMMENT:  In biopsy B. there are multiple fragments of liver parenchyma that show macro and microvesicular steatosis involving less than 20% of hepatocytes. There is lobular activity with chronic inflammation. There is no evidence of increased intracytoplasmic granules (PAS and PASD stains). The portal  tracts are distended by abundant chronic inflammation with bile duct proliferation. There is interface activity involving majority of portal tracts. There is bridging portal to portal fibrosis (trichrome and reticulin stains). The iron deposits are not increased by iron stain. There is no evidence of granulomas or malignancy. Overall the changes are those of early cirrhosis. Definitive etiology can not be determined based on histology alone and further clinical workup is recommended.  This case has been reviewed by Dr. Geralyn Corwin who is in essential agreement with the above diagnosis.    Labs 06/2018: AST 89, ALT 69, Tbili 1.6, aphos normal; Plts 191, Hb 15.9      Review of systems: Pertinent positive and negative review of systems were noted in the above HPI section. All other review negative.   Past Medical History:  Diagnosis Date  . Diabetes ([place])   . Fatty liver   . Gallstones   . GERD (gastroesophageal reflux disease)   . Obesity     Past Surgical History:  Procedure Laterality Date  . BACK SURGERY    . CHOLECYSTECTOMY      Current Outpatient Medications  Medication Sig Dispense Refill  . ibuprofen (ADVIL,MOTRIN) 200 MG tablet Take 400 mg by mouth every 6 (six) hours as needed for headache, mild pain or moderate pain.    . metFORMIN (GLUCOPHAGE) 1000 MG tablet Take 1,000 mg by mouth 2 (two) times daily with a meal.    . Multiple Vitamins-Minerals (MULTIVITAMIN ADULT) TABS Take 1 tablet by mouth daily.    . pantoprazole (PROTONIX) 40 MG tablet Take 40 mg by mouth daily.     No current  facility-administered medications for this visit.     Allergies as of 08/14/2018  . (No Known Allergies)    Family History  Problem Relation Age of Onset  . Diabetes Father   . Heart disease Father   . Diabetes Maternal Grandmother     Social History   Socioeconomic History  . Marital status: Married    Spouse name: Not on file  . Number of children: 1  . Years of  education: Not on file  . Highest education level: Not on file  Occupational History  . Occupation: delivery driver  Social Needs  . Financial resource strain: Not on file  . Food insecurity:    Worry: Not on file    Inability: Not on file  . Transportation needs:    Medical: Not on file    Non-medical: Not on file  Tobacco Use  . Smoking status: Never Smoker  . Smokeless tobacco: Never Used  Substance and Sexual Activity  . Alcohol use: Yes  . Drug use: Never  . Sexual activity: Yes    Partners: Female  Lifestyle  . Physical activity:    Days per week: Not on file    Minutes per session: Not on file  . Stress: Not on file  Relationships  . Social connections:    Talks on phone: Not on file    Gets together: Not on file    Attends religious service: Not on file    Active member of club or organization: Not on file    Attends meetings of clubs or organizations: Not on file    Relationship status: Not on file  . Intimate partner violence:    Fear of current or ex partner: Not on file    Emotionally abused: Not on file    Physically abused: Not on file    Forced sexual activity: Not on file  Other Topics Concern  . Not on file  Social History Narrative  . Not on file     Physical Exam: BP 122/66   Pulse 70   Ht 5' 7.5" (1.715 m)   Wt 225 lb 6 oz (102.2 kg)   BMI 34.78 kg/m  Constitutional: generally well-appearing Psychiatric: alert and oriented x3 Eyes: extraocular movements intact Mouth: oral pharynx moist, no lesions Neck: supple no lymphadenopathy Cardiovascular: heart regular rate and rhythm Lungs: clear to auscultation bilaterally Abdomen: soft, nontender, nondistended, no obvious ascites, no peritoneal signs, normal bowel sounds Extremities: no lower extremity edema bilaterally Skin: no lesions on visible extremities   Assessment and plan: 37 y.o. male with recently diagnosed cirrhosis, intermittent episodes of nausea, vomiting, diarrhea  First I  think it is very likely that his well compensated cirrhosis is indeed from long-standing fatty liver disease.  He will get a battery of blood tests to make sure we are not missing anything else such as viral etiology, autoimmune process.  See a list of those labs below.  He understands he needs hepatoma screening with alpha-fetoprotein and ultrasound about every 6 months and we will start that now.  If he is not immune to hepatitis A and B we will immunize him.  He knows to continue being a seldom alcohol drinker which he is already.  I will proceed with EGD for variceal screening at his soonest convenience.  This also might shed some month light on why he has the vomiting and diarrheal episodes periodically.  He will stop taking Protonix for now which she has been on for the past  month for some mild nausea.  Follow-up appointment with 3 and at least 3 months.    Please see the "Patient Instructions" section for addition details about the plan.   Owens Loffler, MD [facility] 08/14/2018, 8:57 AM  Cc: No ref. provider found

## 2018-08-14 NOTE — Patient Instructions (Addendum)
You will get labs drawn today:  Hepatitis A (IgM and IgG), Hepatitis B surface antigen, Hepatitis B surface antibody, Hepatitis C antibody, total iron, ferritin, TIBC, ANA, AMA, alphafeto protein (AFP), anti smooth muscle antibody, alpha 1 antitrypsin, cerulloplasm, CBC, CMET, INR. Liver US for hepatoma screening. EGD for esophageal varices screening.  Please return to see Dr. Ardis Hughs in 48months.   It is important that you have a relatively low salt diet.  High salt diet can cause fluid to accumulate in your legs, abdomen and even around your lungs. You should try to avoid NSAID type over the counter pain medicines as best as possible. Tylenol is safe to take for 'routine' aches and pains, but never take more than 1/2 the dose suggested on the package instructions (never more than 2 grams per day). Avoid alcohol.  You have been scheduled for an abdominal ultrasound at [facility] (1st floor of hospital) on 08/20/18 at 930am. Please arrive 15 minutes prior to your appointment for registration. Make certain not to have anything to eat or drink 6 hours prior to your appointment. Should you need to reschedule your appointment, please contact radiology at [phone]. This test typically takes about 30 minutes to perform.

## 2018-08-15 LAB — ANA: Anti Nuclear Antibody(ANA): NEGATIVE

## 2018-08-15 LAB — IRON AND TIBC
IRON SATURATION: 53 % (ref 15–55)
IRON: 168 ug/dL (ref 38–169)
Total Iron Binding Capacity: 317 ug/dL (ref 250–450)
UIBC: 149 ug/dL (ref 111–343)

## 2018-08-16 ENCOUNTER — Encounter: Payer: Self-pay | Admitting: Gastroenterology

## 2018-08-16 ENCOUNTER — Ambulatory Visit (AMBULATORY_SURGERY_CENTER): Payer: PRIVATE HEALTH INSURANCE | Admitting: Internal Medicine

## 2018-08-16 VITALS — BP 117/62 | HR 62 | Temp 99.5°F | Resp 22 | Ht 67.5 in | Wt 225.0 lb

## 2018-08-16 DIAGNOSIS — K7460 Unspecified cirrhosis of liver: Secondary | ICD-10-CM

## 2018-08-16 MED ORDER — SODIUM CHLORIDE 0.9 % IV SOLN: 500.0000 mL | Freq: Once | INTRAVENOUS | Status: DC

## 2018-08-16 NOTE — Op Note (Signed)
[facility] Patient Name: Colin Cooley Procedure Date: 08/16/2018 11:43 AM MRN: [id number] Endoscopist: Docia Chuck. Henrene Pastor , MD Age: 37 Referring MD:  Date of Birth: [birth year] Gender: Male Account #: [id number] Procedure:                Upper GI endoscopy Indications:              Cirrhosis rule out esophageal varices Medicines:                Monitored Anesthesia Care Procedure:                Pre-Anesthesia Assessment:                           - Prior to the procedure, a History and Physical                            was performed, and patient medications and                            allergies were reviewed. The patient's tolerance of                            previous anesthesia was also reviewed. The risks                            and benefits of the procedure and the sedation                            options and risks were discussed with the patient.                            All questions were answered, and informed consent                            was obtained. Prior Anticoagulants: The patient has                            taken no previous anticoagulant or antiplatelet                            agents. ASA Grade Assessment: II - A patient with                            mild systemic disease. After reviewing the risks                            and benefits, the patient was deemed in                            satisfactory condition to undergo the procedure.                           After obtaining informed consent, the endoscope was  passed under direct vision. Throughout the                            procedure, the patient's blood pressure, pulse, and                            oxygen saturations were monitored continuously. The                            Endoscope was introduced through the mouth, and                            advanced to the second part of duodenum. The upper                            GI endoscopy was  accomplished without difficulty.                            The patient tolerated the procedure well. Scope In: Scope Out: Findings:                 The esophagus was normal. NO VARICES.                           The stomach was normal.                           The examined duodenum was normal.                           The cardia and gastric fundus were normal on                            retroflexion. Complications:            No immediate complications. Estimated Blood Loss:     Estimated blood loss: none. Impression:               - Normal esophagus.                           - Normal stomach.                           - Normal examined duodenum.                           - No specimens collected. Recommendation:           - Patient has a contact number available for                            emergencies. The signs and symptoms of potential                            delayed complications were discussed with the  patient. Return to normal activities tomorrow.                            Written discharge instructions were provided to the                            patient.                           - Resume previous diet.                           - Continue present medications.                           - REPEAT SCREENING EGD IN 2-3 YEARS                           - FOLLOW UP IN THE OFFICE WITH DR JACOBS AT YOUR                            NEAREST [place]. Henrene Pastor, MD 08/16/2018 12:05:18 PM This report has been signed electronically.

## 2018-08-16 NOTE — Progress Notes (Signed)
To PACU, VSS. Report to [email or web address] 

## 2018-08-16 NOTE — Patient Instructions (Signed)
YOU HAD AN ENDOSCOPIC PROCEDURE TODAY AT THE [facility]:   Refer to the procedure report that was given to you for any specific questions about what was found during the examination.  If the procedure report does not answer your questions, please call your gastroenterologist to clarify.  If you requested that your care partner not be given the details of your procedure findings, then the procedure report has been included in a sealed envelope for you to review at your convenience later.  YOU SHOULD EXPECT: Some feelings of bloating in the abdomen. Passage of more gas than usual.  Walking can help get rid of the air that was put into your GI tract during the procedure and reduce the bloating.   Please Note:  You might notice some irritation and congestion in your nose or some drainage.  This is from the oxygen used during your procedure.  There is no need for concern and it should clear up in a day or so.  SYMPTOMS TO REPORT IMMEDIATELY:    Following upper endoscopy (EGD)  Vomiting of blood or coffee ground material  New chest pain or pain under the shoulder blades  Painful or persistently difficult swallowing  New shortness of breath  Fever of 100F or higher  Black, tarry-looking stools  For urgent or emergent issues, a gastroenterologist can be reached at any hour by calling [phone].   DIET:  We do recommend a small meal at first, but then you may proceed to your regular diet.  Drink plenty of fluids but you should avoid alcoholic beverages for 24 hours.  ACTIVITY:  You should plan to take it easy for the rest of today and you should NOT DRIVE or use heavy machinery until tomorrow (because of the sedation medicines used during the test).    FOLLOW UP: Our staff will call the number listed on your records the next business day following your procedure to check on you and address any questions or concerns that you may have regarding the information given to you  following your procedure. If we do not reach you, we will leave a message.  However, if you are feeling well and you are not experiencing any problems, there is no need to return our call.  We will assume that you have returned to your regular daily activities without incident.  If any biopsies were taken you will be contacted by phone or by letter within the next 1-3 weeks.  Please call us at [phone] if you have not heard about the biopsies in 3 weeks.    SIGNATURES/CONFIDENTIALITY: You and/or your care partner have signed paperwork which will be entered into your electronic medical record.  These signatures attest to the fact that that the information above on your After Visit Summary has been reviewed and is understood.  Full responsibility of the confidentiality of this discharge information lies with you and/or your care-partner.  Read all handouts given to you by your recovery room nurse. 

## 2018-08-16 NOTE — Progress Notes (Signed)
Pt's states no medical or surgical changes since previsit or office visit. 

## 2018-08-18 LAB — MITOCHONDRIAL ANTIBODIES

## 2018-08-18 LAB — ANTI-SMOOTH MUSCLE ANTIBODY, IGG

## 2018-08-18 LAB — HEPATITIS A ANTIBODY, TOTAL: Hepatitis A AB,Total: NONREACTIVE

## 2018-08-18 LAB — HEPATITIS B SURFACE ANTIGEN: HEP B S AG: NONREACTIVE

## 2018-08-18 LAB — AFP TUMOR MARKER: AFP TUMOR MARKER: 6.7 ng/mL — AB (ref <6.1)

## 2018-08-18 LAB — IGM: IgM, Serum: 142 mg/dL (ref 50–300)

## 2018-08-18 LAB — ALPHA-1-ANTITRYPSIN: A-1 Antitrypsin, Ser: 231 mg/dL — ABNORMAL HIGH (ref 83–199)

## 2018-08-18 LAB — HEPATITIS C ANTIBODY
HEP C AB: NONREACTIVE
SIGNAL TO CUT-OFF: 0.02 (ref <1.00)

## 2018-08-18 LAB — CERULOPLASMIN: CERULOPLASMIN: 39 mg/dL — AB (ref 18–36)

## 2018-08-18 LAB — HEPATITIS B SURFACE ANTIBODY,QUALITATIVE: HEP B S AB: NONREACTIVE

## 2018-08-18 LAB — IGG: IgG (Immunoglobin G), Serum: 1532 mg/dL (ref 600–1640)

## 2018-08-19 ENCOUNTER — Telehealth: Payer: Self-pay

## 2018-08-19 NOTE — Telephone Encounter (Signed)
  Follow up Call-  Call back number 08/16/2018  Post procedure Call Back phone  # [phone]  Permission to leave phone message Yes  Some recent data might be hidden     Patient questions:  Do you have a fever, pain , or abdominal swelling? No. Pain Score  0 *  Have you tolerated food without any problems? Yes.    Have you been able to return to your normal activities? Yes.    Do you have any questions about your discharge instructions: Diet   No. Medications  No. Follow up visit  No.  Do you have questions or concerns about your Care? No.  Actions: * If pain score is 4 or above: No action needed, pain <4.  No problems noted per pt. maw

## 2018-08-20 ENCOUNTER — Ambulatory Visit (HOSPITAL_COMMUNITY)
Admission: RE | Admit: 2018-08-20 | Discharge: 2018-08-20 | Disposition: A | Discharge status: Home / Self Care | Payer: No Typology Code available for payment source | Source: Ambulatory Visit | Attending: Gastroenterology | Admitting: Gastroenterology

## 2018-08-20 DIAGNOSIS — Z9049 Acquired absence of other specified parts of digestive tract: Secondary | ICD-10-CM | POA: Insufficient documentation

## 2018-08-20 DIAGNOSIS — Z8719 Personal history of other diseases of the digestive system: Secondary | ICD-10-CM

## 2018-08-20 DIAGNOSIS — R161 Splenomegaly, not elsewhere classified: Secondary | ICD-10-CM | POA: Diagnosis not present

## 2018-08-20 DIAGNOSIS — C220 Liver cell carcinoma: Secondary | ICD-10-CM | POA: Insufficient documentation

## 2018-08-20 DIAGNOSIS — R932 Abnormal findings on diagnostic imaging of liver and biliary tract: Secondary | ICD-10-CM | POA: Insufficient documentation

## 2018-08-27 ENCOUNTER — Other Ambulatory Visit: Payer: Self-pay

## 2018-08-27 DIAGNOSIS — K7460 Unspecified cirrhosis of liver: Secondary | ICD-10-CM

## 2018-09-02 ENCOUNTER — Ambulatory Visit (INDEPENDENT_AMBULATORY_CARE_PROVIDER_SITE_OTHER): Payer: PRIVATE HEALTH INSURANCE | Admitting: Gastroenterology

## 2018-09-02 DIAGNOSIS — Z23 Encounter for immunization: Secondary | ICD-10-CM

## 2018-09-09 ENCOUNTER — Ambulatory Visit (INDEPENDENT_AMBULATORY_CARE_PROVIDER_SITE_OTHER): Payer: PRIVATE HEALTH INSURANCE | Admitting: Gastroenterology

## 2018-09-09 DIAGNOSIS — Z23 Encounter for immunization: Secondary | ICD-10-CM

## 2018-09-30 ENCOUNTER — Ambulatory Visit (INDEPENDENT_AMBULATORY_CARE_PROVIDER_SITE_OTHER): Payer: PRIVATE HEALTH INSURANCE | Admitting: Gastroenterology

## 2018-09-30 ENCOUNTER — Other Ambulatory Visit: Payer: Self-pay

## 2018-09-30 DIAGNOSIS — Z23 Encounter for immunization: Secondary | ICD-10-CM | POA: Diagnosis not present

## 2018-10-17 ENCOUNTER — Other Ambulatory Visit (INDEPENDENT_AMBULATORY_CARE_PROVIDER_SITE_OTHER): Payer: PRIVATE HEALTH INSURANCE

## 2018-10-17 DIAGNOSIS — K7460 Unspecified cirrhosis of liver: Secondary | ICD-10-CM | POA: Diagnosis not present

## 2018-10-17 LAB — CBC WITH DIFFERENTIAL/PLATELET
Basophils Absolute: 0.1 10*3/uL (ref 0.0–0.1)
Basophils Relative: 1.2 % (ref 0.0–3.0)
EOS PCT: 4.2 % (ref 0.0–5.0)
Eosinophils Absolute: 0.2 10*3/uL (ref 0.0–0.7)
HEMATOCRIT: 44.5 % (ref 39.0–52.0)
Hemoglobin: 15.3 g/dL (ref 13.0–17.0)
Lymphocytes Relative: 24.5 % (ref 12.0–46.0)
Lymphs Abs: 1.3 10*3/uL (ref 0.7–4.0)
MCHC: 34.4 g/dL (ref 30.0–36.0)
MCV: 93 fl (ref 78.0–100.0)
MONO ABS: 0.6 10*3/uL (ref 0.1–1.0)
Monocytes Relative: 11 % (ref 3.0–12.0)
NEUTROS ABS: 3.1 10*3/uL (ref 1.4–7.7)
NEUTROS PCT: 59.1 % (ref 43.0–77.0)
Platelets: 147 10*3/uL — ABNORMAL LOW (ref 150.0–400.0)
RBC: 4.78 Mil/uL (ref 4.22–5.81)
RDW: 14.2 % (ref 11.5–15.5)
WBC: 5.3 10*3/uL (ref 4.0–10.5)

## 2018-10-17 LAB — COMPREHENSIVE METABOLIC PANEL
ALT: 47 U/L (ref 0–53)
AST: 69 U/L — AB (ref 0–37)
Albumin: 4.2 g/dL (ref 3.5–5.2)
Alkaline Phosphatase: 130 U/L — ABNORMAL HIGH (ref 39–117)
BUN: 10 mg/dL (ref 6–23)
CO2: 26 mEq/L (ref 19–32)
Calcium: 9.7 mg/dL (ref 8.4–10.5)
Chloride: 104 mEq/L (ref 96–112)
Creatinine, Ser: 0.8 mg/dL (ref 0.40–1.50)
GFR: 114.16 mL/min (ref >60.00)
Glucose, Bld: 200 mg/dL — ABNORMAL HIGH (ref 70–99)
Potassium: 3.9 mEq/L (ref 3.5–5.1)
Sodium: 139 mEq/L (ref 135–145)
Total Bilirubin: 2.9 mg/dL — ABNORMAL HIGH (ref 0.2–1.2)
Total Protein: 7.8 g/dL (ref 6.0–8.3)

## 2018-10-17 LAB — PROTIME-INR
INR: 1.2 ratio — AB (ref 0.8–1.0)
PROTHROMBIN TIME: 13.6 s — AB (ref 9.6–13.1)

## 2018-10-18 LAB — AFP TUMOR MARKER: AFP-Tumor Marker: 6.8 ng/mL — ABNORMAL HIGH (ref <6.1)

## 2018-10-22 ENCOUNTER — Encounter: Payer: Self-pay | Admitting: Gastroenterology

## 2018-10-22 ENCOUNTER — Ambulatory Visit (INDEPENDENT_AMBULATORY_CARE_PROVIDER_SITE_OTHER): Payer: PRIVATE HEALTH INSURANCE | Admitting: Gastroenterology

## 2018-10-22 VITALS — BP 116/80 | HR 70 | Ht 67.0 in | Wt 229.2 lb

## 2018-10-22 DIAGNOSIS — K7460 Unspecified cirrhosis of liver: Secondary | ICD-10-CM

## 2018-10-22 NOTE — Patient Instructions (Addendum)
MRI of liver for cirrhosis, elevated AFP level (check for hepatoma). Following that result, will arrange continued every 6 month hepatoma screening.   It is important that you have a relatively low salt diet.  High salt diet can cause fluid to accumulate in your legs, abdomen and even around your lungs. You should try to avoid NSAID type over the counter pain medicines as best as possible. Tylenol is safe to take for 'routine' aches and pains, but never take more than 1/2 the dose suggested on the package instructions (never more than 2 grams per day). Avoid alcohol.  Please return to see Dr. Ardis Hughs in 6 months (cbc, cmet, INR, AFP about a week prior).  Stop your Protonix  You have been scheduled for an MRI at [facility] on 10/26/18. Your appointment time is 1230pm. Please arrive 15 minutes prior to your appointment time for registration purposes. Please make certain not to have anything to eat or drink 6 hours prior to your test. In addition, if you have any metal in your body, have a pacemaker or defibrillator, please be sure to let your ordering physician know. This test typically takes 45 minutes to 1 hour to complete. Should you need to reschedule, please call [phone] to do so.

## 2018-10-22 NOTE — Progress Notes (Signed)
Review of pertinent gastrointestinal problems: 1. Cirrhosis.  Well compensated, diagnosed incidentally during laparoscopic cholecystectomy. Presumed fatty liver related.  Lab work-up 2019: Hepatitis C antibody negative, hepatitis B surface antibody negative, hepatitis B surface antigen negative, hepatitis A antibody nonreactive, iron studies normal, ceruloplasmin slightly elevated at 39, alpha 1 antitrypsin level slightly elevated at 231, anti-smooth muscle antibody negative, AMA negative, ANA negative.  Hep A/B immunization started fall 2019  EGD October 2019 was normal.  Should repeat October 2022 for repeat variceal screening  Hepatoma screening; October 2019 ultrasound showed cirrhosis without obvious mass lesions.  October 2019 alpha-fetoprotein slightly elevated at 6.7  MELD-NA 10/2018 labs is 12  Liver biopsy during lap chole 02/2018: "early"  cirrhosis, no clear etiology based on path   HPI: This is a very pleasant 37 year old man whom I last saw about 2 months ago at the time of an office visit  Chief complaint is cirrhosis, likely Karlene Lineman related  He has had no trouble with swelling, encephalopathy, overt GI bleeding.  He does have intermittent fatigue and some nausea.  He tried stopping proton pump inhibitor for a brief time and still had these waves of symptoms.  He restarted the proton pump inhibitor and the symptoms continue.  They are not debilitating.  He is also having to get up to help with his 62-month-old baby in the middle the night at times.  Weight is up 4 pounds in 2 months since last visit.  ROS: complete GI ROS as described in HPI, all other review negative.  Constitutional:  No unintentional weight loss   Past Medical History:  Diagnosis Date  . Diabetes ([place])   . Fatty liver   . Gallstones   . GERD (gastroesophageal reflux disease)   . Obesity     Past Surgical History:  Procedure Laterality Date  . BACK SURGERY    . CHOLECYSTECTOMY      Current  Outpatient Medications  Medication Sig Dispense Refill  . acetaminophen (TYLENOL) 325 MG tablet Take 650 mg by mouth as needed.    . metFORMIN (GLUCOPHAGE) 500 MG tablet Take by mouth 2 (two) times daily with a meal.    . Multiple Vitamins-Minerals (MULTIVITAMIN ADULT) TABS Take 1 tablet by mouth daily.    . pantoprazole (PROTONIX) 40 MG tablet Take 40 mg by mouth daily.     No current facility-administered medications for this visit.     Allergies as of 10/22/2018  . (No Known Allergies)    Family History  Problem Relation Age of Onset  . Diabetes Father   . Heart disease Father   . Diabetes Maternal Grandmother     Social History   Socioeconomic History  . Marital status: Married    Spouse name: Not on file  . Number of children: 1  . Years of education: Not on file  . Highest education level: Not on file  Occupational History  . Occupation: delivery driver  Social Needs  . Financial resource strain: Not on file  . Food insecurity:    Worry: Not on file    Inability: Not on file  . Transportation needs:    Medical: Not on file    Non-medical: Not on file  Tobacco Use  . Smoking status: Never Smoker  . Smokeless tobacco: Never Used  Substance and Sexual Activity  . Alcohol use: Not Currently    Comment: last drink in June 2019  . Drug use: Never  . Sexual activity: Yes    Partners: Female  Lifestyle  . Physical activity:    Days per week: Not on file    Minutes per session: Not on file  . Stress: Not on file  Relationships  . Social connections:    Talks on phone: Not on file    Gets together: Not on file    Attends religious service: Not on file    Active member of club or organization: Not on file    Attends meetings of clubs or organizations: Not on file    Relationship status: Not on file  . Intimate partner violence:    Fear of current or ex partner: Not on file    Emotionally abused: Not on file    Physically abused: Not on file    Forced sexual  activity: Not on file  Other Topics Concern  . Not on file  Social History Narrative  . Not on file     Physical Exam: BP 116/80   Pulse 70   Ht 5\' 7"  (1.702 m)   Wt 229 lb 4 oz (104 kg)   BMI 35.91 kg/m  Constitutional: generally well-appearing Psychiatric: alert and oriented x3 Abdomen: soft, nontender, nondistended, no obvious ascites, no peritoneal signs, normal bowel sounds No peripheral edema noted in lower extremities  Assessment and plan: 37 y.o. male with well compensated cirrhosis  Likely [place] related.  His alpha-fetoprotein is persistently, slightly elevated.  Because of that I want better imaging of his liver with an MRI to check for underlying hepatoma.  His meld score is 12.  He will return to see me in 6 months and sooner if any issues. His ceruloplasmin was slightly elevated 2 months ago.  I will plan to recheck that at next lab draw.  Please see the "Patient Instructions" section for addition details about the plan.  Owens Loffler, MD [facility] 10/22/2018, 8:49 AM

## 2018-10-26 ENCOUNTER — Ambulatory Visit (HOSPITAL_COMMUNITY)
Admission: RE | Admit: 2018-10-26 | Discharge: 2018-10-26 | Disposition: A | Discharge status: Home / Self Care | Payer: No Typology Code available for payment source | Source: Ambulatory Visit | Attending: Gastroenterology | Admitting: Gastroenterology

## 2018-10-26 DIAGNOSIS — R161 Splenomegaly, not elsewhere classified: Secondary | ICD-10-CM | POA: Diagnosis not present

## 2018-10-26 DIAGNOSIS — K7460 Unspecified cirrhosis of liver: Secondary | ICD-10-CM | POA: Insufficient documentation

## 2018-10-26 MED ORDER — GADOBUTROL 1 MMOL/ML IV SOLN
10.0000 mL | Freq: Once | INTRAVENOUS | Status: AC | PRN
  Administered 2018-10-26: 10 mL via INTRAVENOUS

## 2019-01-17 ENCOUNTER — Telehealth: Payer: Self-pay | Admitting: Gastroenterology

## 2019-01-17 NOTE — Telephone Encounter (Signed)
Reviewed outside labs drawn by his primary care physician  March 8208 complete metabolic profile AST 86, ALT 61, total bilirubin 2.2.  Creatinine 0.8

## 2019-04-21 ENCOUNTER — Telehealth: Payer: Self-pay

## 2019-04-21 DIAGNOSIS — K7460 Unspecified cirrhosis of liver: Secondary | ICD-10-CM

## 2019-04-21 NOTE — Telephone Encounter (Signed)
You have been scheduled for an abdominal ultrasound at [facility] (1st floor of hospital) on 05/02/19 at 930 am. Please arrive 15 minutes prior to your appointment for registration. Make certain not to have anything to eat or drink 6 hours prior to your appointment. Should you need to reschedule your appointment, please contact radiology at [phone]. This test typically takes about 30 minutes to perform.  Labs in [place] 05/07/19 at 10 am over the phone

## 2019-04-21 NOTE — Telephone Encounter (Signed)
The patient has been notified of this information and all questions answered. The pt has been advised of the information and verbalized understanding.    

## 2019-04-21 NOTE — Telephone Encounter (Signed)
-----   Message from Algernon Huxley, RN sent at 10/31/2018  8:52 AM EST ----- Regarding: Labs and OV/US Pt needs cbc, cmet, inr, afp-40mth [place] of RUQ-68mth Need OV with Dr. Ardis Hughs shortly after labs and [place]

## 2019-04-23 ENCOUNTER — Other Ambulatory Visit (INDEPENDENT_AMBULATORY_CARE_PROVIDER_SITE_OTHER): Payer: No Typology Code available for payment source

## 2019-04-23 DIAGNOSIS — K7460 Unspecified cirrhosis of liver: Secondary | ICD-10-CM | POA: Diagnosis not present

## 2019-04-23 LAB — COMPREHENSIVE METABOLIC PANEL
ALT: 46 U/L (ref 0–53)
AST: 69 U/L — ABNORMAL HIGH (ref 0–37)
Albumin: 4.1 g/dL (ref 3.5–5.2)
Alkaline Phosphatase: 114 U/L (ref 39–117)
BUN: 12 mg/dL (ref 6–23)
CO2: 24 mEq/L (ref 19–32)
Calcium: 9.9 mg/dL (ref 8.4–10.5)
Chloride: 106 mEq/L (ref 96–112)
Creatinine, Ser: 0.79 mg/dL (ref 0.40–1.50)
GFR: 108.7 mL/min (ref >60.00)
Glucose, Bld: 130 mg/dL — ABNORMAL HIGH (ref 70–99)
Potassium: 3.9 mEq/L (ref 3.5–5.1)
Sodium: 140 mEq/L (ref 135–145)
Total Bilirubin: 1.9 mg/dL — ABNORMAL HIGH (ref 0.2–1.2)
Total Protein: 7.3 g/dL (ref 6.0–8.3)

## 2019-04-23 LAB — CBC WITH DIFFERENTIAL/PLATELET
Basophils Absolute: 0 10*3/uL (ref 0.0–0.1)
Basophils Relative: 1 % (ref 0.0–3.0)
Eosinophils Absolute: 0.2 10*3/uL (ref 0.0–0.7)
Eosinophils Relative: 3.7 % (ref 0.0–5.0)
HCT: 43.5 % (ref 39.0–52.0)
Hemoglobin: 15.1 g/dL (ref 13.0–17.0)
Lymphocytes Relative: 26.2 % (ref 12.0–46.0)
Lymphs Abs: 1.2 10*3/uL (ref 0.7–4.0)
MCHC: 34.8 g/dL (ref 30.0–36.0)
MCV: 92.7 fl (ref 78.0–100.0)
Monocytes Absolute: 0.5 10*3/uL (ref 0.1–1.0)
Monocytes Relative: 11.5 % (ref 3.0–12.0)
Neutro Abs: 2.5 10*3/uL (ref 1.4–7.7)
Neutrophils Relative %: 57.6 % (ref 43.0–77.0)
Platelets: 136 10*3/uL — ABNORMAL LOW (ref 150.0–400.0)
RBC: 4.69 Mil/uL (ref 4.22–5.81)
RDW: 14.5 % (ref 11.5–15.5)
WBC: 4.4 10*3/uL (ref 4.0–10.5)

## 2019-04-23 LAB — PROTIME-INR
INR: 1.1 ratio — ABNORMAL HIGH (ref 0.8–1.0)
Prothrombin Time: 13.1 s (ref 9.6–13.1)

## 2019-04-24 LAB — AFP TUMOR MARKER: AFP-Tumor Marker: 4.6 ng/mL (ref <6.1)

## 2019-05-02 ENCOUNTER — Other Ambulatory Visit: Payer: Self-pay

## 2019-05-02 ENCOUNTER — Ambulatory Visit (HOSPITAL_COMMUNITY)
Admission: RE | Admit: 2019-05-02 | Discharge: 2019-05-02 | Disposition: A | Discharge status: Home / Self Care | Payer: No Typology Code available for payment source | Source: Ambulatory Visit | Attending: Gastroenterology | Admitting: Gastroenterology

## 2019-05-02 DIAGNOSIS — K7460 Unspecified cirrhosis of liver: Secondary | ICD-10-CM

## 2019-05-07 ENCOUNTER — Ambulatory Visit (INDEPENDENT_AMBULATORY_CARE_PROVIDER_SITE_OTHER): Payer: No Typology Code available for payment source | Admitting: Gastroenterology

## 2019-05-07 ENCOUNTER — Encounter: Payer: Self-pay | Admitting: Gastroenterology

## 2019-05-07 VITALS — Ht 67.0 in | Wt 215.0 lb

## 2019-05-07 DIAGNOSIS — K7460 Unspecified cirrhosis of liver: Secondary | ICD-10-CM | POA: Diagnosis not present

## 2019-05-07 NOTE — Patient Instructions (Addendum)
We will arrange MRI of his liver to follow-up with the abnormality noted on recent ultrasound  You have been scheduled for an MRI at [facility] on 05/12/19. Your appointment time is 4pm. Please arrive 15 minutes prior to your appointment time for registration purposes. Please make certain not to have anything to eat or drink 6 hours prior to your test. In addition, if you have any metal in your body, have a pacemaker or defibrillator, please be sure to let your ordering physician know. This test typically takes 45 minutes to 1 hour to complete. Should you need to reschedule, please call [phone] to do so.  Thank you for entrusting me with your care and choosing [facility].  Dr Ardis Hughs

## 2019-05-07 NOTE — Progress Notes (Signed)
Review of pertinent gastrointestinal problems: 1. Cirrhosis.  Well compensated, diagnosed incidentally during laparoscopic cholecystectomy. Presumed fatty liver related.  Lab work-up 2019: Hepatitis C antibody negative, hepatitis B surface antibody negative, hepatitis B surface antigen negative, hepatitis A antibody nonreactive, iron studies normal, ceruloplasmin slightly elevated at 39, alpha 1 antitrypsin level slightly elevated at 231, anti-smooth muscle antibody negative, AMA negative, ANA negative.  Hep A/B immunization started fall 2019  EGD October 2019 was normal.  Should repeat October 2022 for repeat variceal screening  Hepatoma screening; October 2019 ultrasound showed cirrhosis without obvious mass lesions.  October 2019 alpha-fetoprotein slightly elevated at 6.7; MRI 10/2018 " markedly nodular liver" no suspicious mass lesions.  04/2019 [place] "new focal masslike area in the anterior segment of the right lobe of the liver measuring 2.4 x 2.7 cm.  This may well represent a dysplastic type nodule" the radiologist recommended an MRI as follow-up.  AFP 04/2019 4.6 (normal)  MELD-NA 04/2019 labs is 10  Liver biopsy during lap chole 02/2018: "early"  cirrhosis, no clear etiology based on path    This service was provided via virtual visit.  Both audio and visual were used.   The patient was located at home.  I was located in my office.  The patient did consent to this virtual visit and is aware of possible charges through their insurance for this visit.  The patient is an established patient.  My certified medical assistant, Grace Bushy, contributed to this visit by contacting the patient by phone 1 or 2 business days prior to the appointment and also followed up on the recommendations I made after the visit.  Time spent on virtual visit: 19 minutes   HPI: This is a very pleasant 38 year old man whom I last saw in person in our office about 6 or 7 months ago.  He is here for routine cirrhosis  follow-up.  He had an ultrasound and lab tests done last week or so.   Ultrasound showed a new abnormality in his liver.  Radiologist recommended MRI follow-up.  Blood tests show that his meld score has decreased to 10 down from 12.  In past couple weeks he's felt nauseas and sluggish.  He's been working outside a lot lately.  No edema in ankles.  He's lost a bit of weight, intentionally.  His wife has had him eating better.  His wife is a pharmacist asked about zinc supplement.  She also wondered about  Pneumococcus vaccination  Chief complaint is cirrhosis  ROS: complete GI ROS as described in HPI, all other review negative.  Constitutional:  No unintentional weight loss   Past Medical History:  Diagnosis Date  . Diabetes ([place])   . Fatty liver   . Gallstones   . GERD (gastroesophageal reflux disease)   . Obesity     Past Surgical History:  Procedure Laterality Date  . BACK SURGERY    . CHOLECYSTECTOMY      Current Outpatient Medications  Medication Sig Dispense Refill  . acetaminophen (TYLENOL) 325 MG tablet Take 650 mg by mouth as needed.    . metFORMIN (GLUCOPHAGE) 500 MG tablet Take by mouth 2 (two) times daily with a meal.    . Multiple Vitamins-Minerals (MULTIVITAMIN ADULT) TABS Take 1 tablet by mouth daily.     No current facility-administered medications for this visit.     Allergies as of 05/07/2019  . (No Known Allergies)    Family History  Problem Relation Age of Onset  . Diabetes Father   .  Heart disease Father   . Diabetes Maternal Grandmother     Social History   Socioeconomic History  . Marital status: Married    Spouse name: Not on file  . Number of children: 1  . Years of education: Not on file  . Highest education level: Not on file  Occupational History  . Occupation: delivery driver  Social Needs  . Financial resource strain: Not on file  . Food insecurity    Worry: Not on file    Inability: Not on file  . Transportation needs     Medical: Not on file    Non-medical: Not on file  Tobacco Use  . Smoking status: Never Smoker  . Smokeless tobacco: Never Used  Substance and Sexual Activity  . Alcohol use: Not Currently    Comment: last drink in June 2019  . Drug use: Never  . Sexual activity: Yes    Partners: Female  Lifestyle  . Physical activity    Days per week: Not on file    Minutes per session: Not on file  . Stress: Not on file  Relationships  . Social Herbalist on phone: Not on file    Gets together: Not on file    Attends religious service: Not on file    Active member of club or organization: Not on file    Attends meetings of clubs or organizations: Not on file    Relationship status: Not on file  . Intimate partner violence    Fear of current or ex partner: Not on file    Emotionally abused: Not on file    Physically abused: Not on file    Forced sexual activity: Not on file  Other Topics Concern  . Not on file  Social History Narrative  . Not on file     Physical Exam: Unable to perform because this was a "telemed visit" due to current Covid-19 pandemic  Assessment and plan: 38 y.o. male with Karlene Lineman related cirrhosis  Most recent meld score is 10.  This is down from 12 6 months ago.  He seems to be doing very well clinically.  Ultrasound last week suggested a new abnormal area of his liver that needs further characterization with MRI and I will order that for him today.  His wife asked about zinc supplementation and certainly I think that that is a fine idea with very little downside.  She also asked about pneumococcus vaccination and that is not a routine practice of mine I recommended that he contact his primary care physician about that.  He also has diabetes and so that could factor in on the decision for pneumococcus vaccination.  Following MRI results I will contact him and make a plan for follow-up at that point.  Please see the "Patient Instructions" section for addition  details about the plan.  Owens Loffler, MD [facility] 05/07/2019, 9:41 AM

## 2019-05-12 ENCOUNTER — Ambulatory Visit (HOSPITAL_COMMUNITY)
Admission: RE | Admit: 2019-05-12 | Discharge: 2019-05-12 | Disposition: A | Discharge status: Home / Self Care | Payer: No Typology Code available for payment source | Source: Ambulatory Visit | Attending: Gastroenterology | Admitting: Gastroenterology

## 2019-05-12 ENCOUNTER — Other Ambulatory Visit: Payer: Self-pay

## 2019-05-12 DIAGNOSIS — K7460 Unspecified cirrhosis of liver: Secondary | ICD-10-CM

## 2019-05-12 MED ORDER — GADOBUTROL 1 MMOL/ML IV SOLN
10.0000 mL | Freq: Once | INTRAVENOUS | Status: AC | PRN
  Administered 2019-05-12: 17:00:00 10 mL via INTRAVENOUS

## 2019-07-24 ENCOUNTER — Other Ambulatory Visit: Payer: Self-pay | Admitting: Cardiology

## 2019-07-24 DIAGNOSIS — Z20822 Contact with and (suspected) exposure to covid-19: Secondary | ICD-10-CM

## 2019-07-24 NOTE — Addendum Note (Signed)
Addended by: Terence Lux on: 07/24/2019 04:24 PM   Modules accepted: Orders

## 2019-07-29 ENCOUNTER — Telehealth: Payer: Self-pay | Admitting: *Deleted

## 2019-07-29 ENCOUNTER — Other Ambulatory Visit: Payer: Self-pay

## 2019-07-29 DIAGNOSIS — Z20822 Contact with and (suspected) exposure to covid-19: Secondary | ICD-10-CM

## 2019-07-29 NOTE — Telephone Encounter (Signed)
Patient had covid19 testing on 07/24/19, order ID [id number].Test was cancelled by RN several hours after performed. TC to [facility] to inquire on order status/results. No order/results found.  TC to patient to inform he should be retested today and apologized for mistake.  He does not have any symptoms at this time, was exposed by a co-worker last week. He will go for testing today.

## 2019-07-31 LAB — NOVEL CORONAVIRUS, NAA: SARS-CoV-2, NAA: NOT DETECTED

## 2019-09-03 ENCOUNTER — Ambulatory Visit (INDEPENDENT_AMBULATORY_CARE_PROVIDER_SITE_OTHER): Payer: No Typology Code available for payment source | Admitting: Gastroenterology

## 2019-09-03 ENCOUNTER — Telehealth: Payer: Self-pay | Admitting: Gastroenterology

## 2019-09-03 DIAGNOSIS — Z23 Encounter for immunization: Secondary | ICD-10-CM | POA: Diagnosis not present

## 2019-09-03 NOTE — Telephone Encounter (Signed)
LMOM that patient will have a ROV with imaging and labs prior to appointment

## 2019-09-24 ENCOUNTER — Other Ambulatory Visit: Payer: Self-pay

## 2019-09-24 DIAGNOSIS — Z20822 Contact with and (suspected) exposure to covid-19: Secondary | ICD-10-CM

## 2019-09-25 LAB — NOVEL CORONAVIRUS, NAA: SARS-CoV-2, NAA: NOT DETECTED

## 2019-09-26 IMAGING — US US ABDOMEN COMPLETE
1 series · 13 of 25 positions shown · non-contrast
Comparison: Limited right upper quadrant ultrasound February 19, 2018

CLINICAL DATA: Hepatoma screening, previous cholecystectomy.
History of esophageal varices.

EXAM:
ABDOMEN ULTRASOUND COMPLETE

[Series 1: us abdomen complete · 67 acquisitions, 13 frames shown]
[im 1/67]
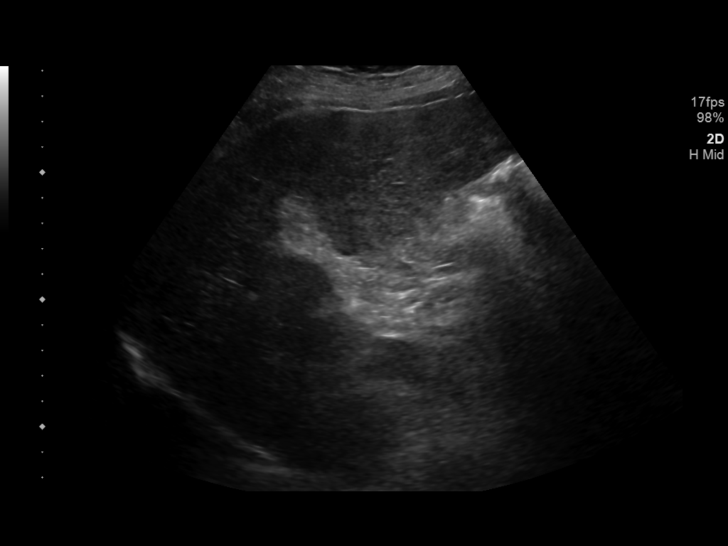
[im 6/67]
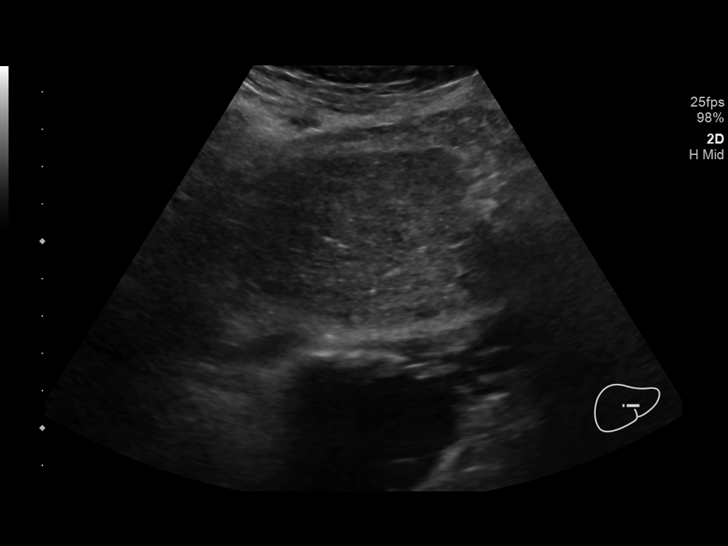
[im 12/67]
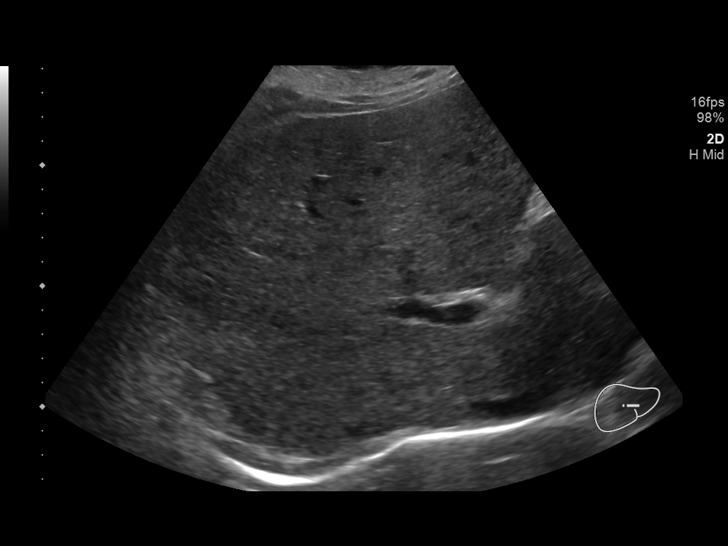
[im 17/67]
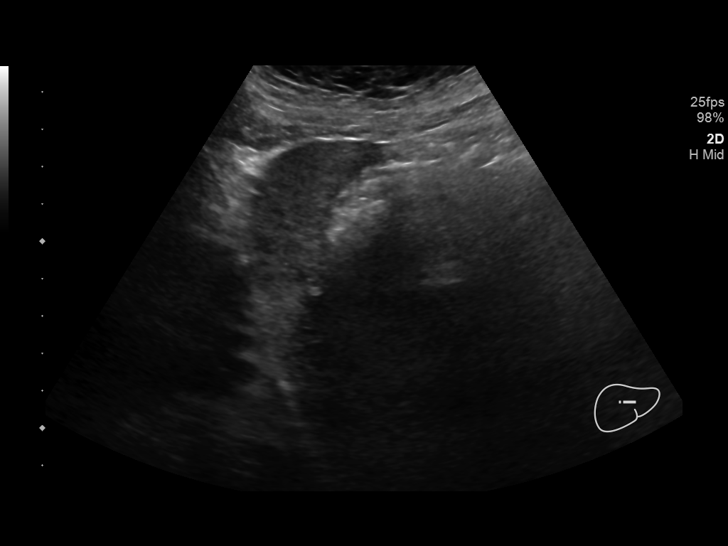
[im 23/67]
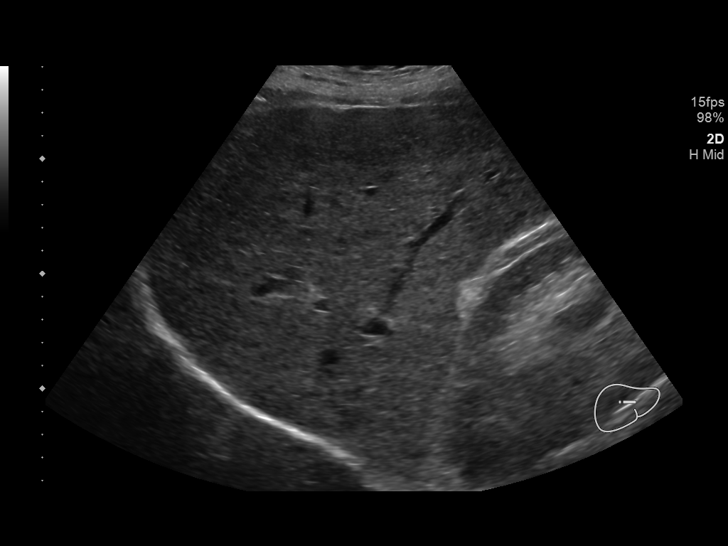
[im 28/67]
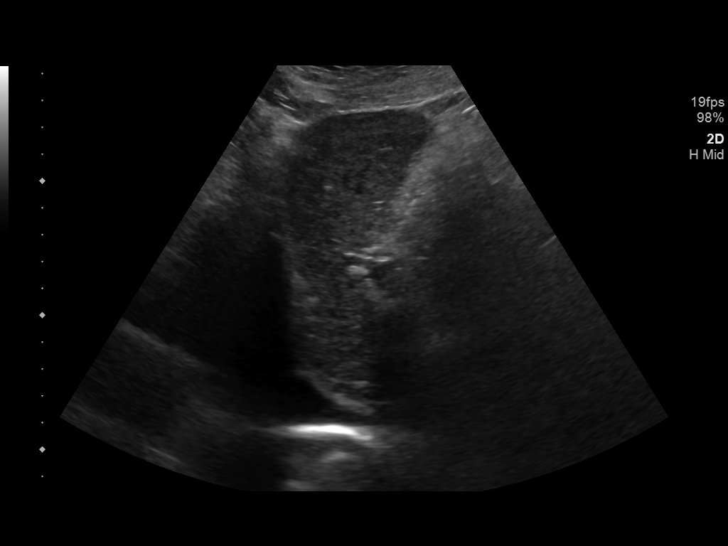
[im 34/67]
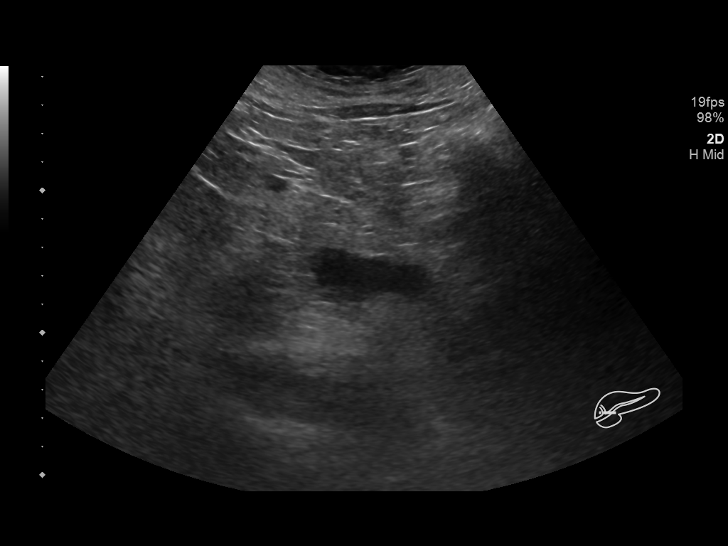
[im 39/67]
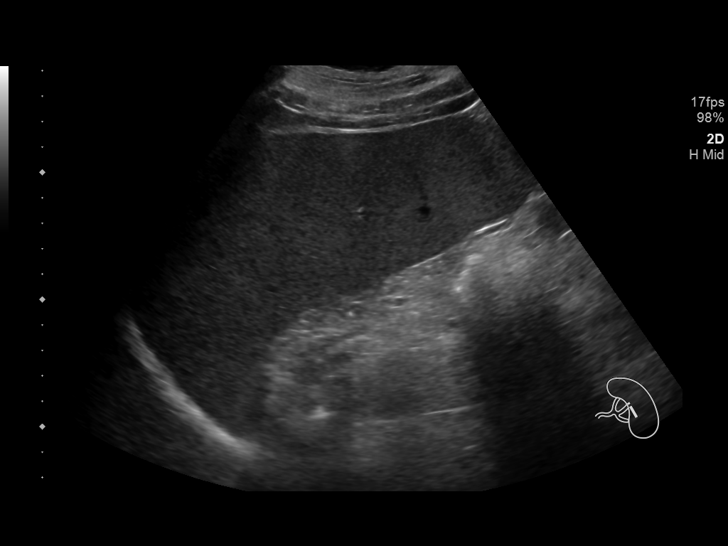
[im 45/67]
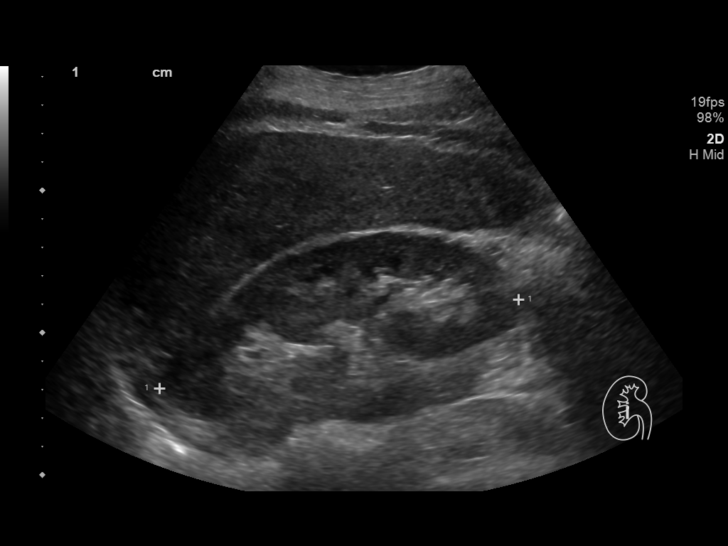
[im 50/67]
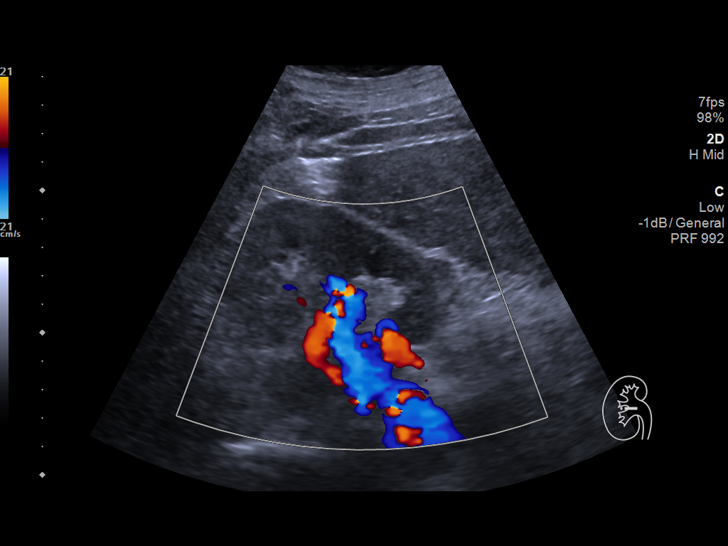
[im 56/67]
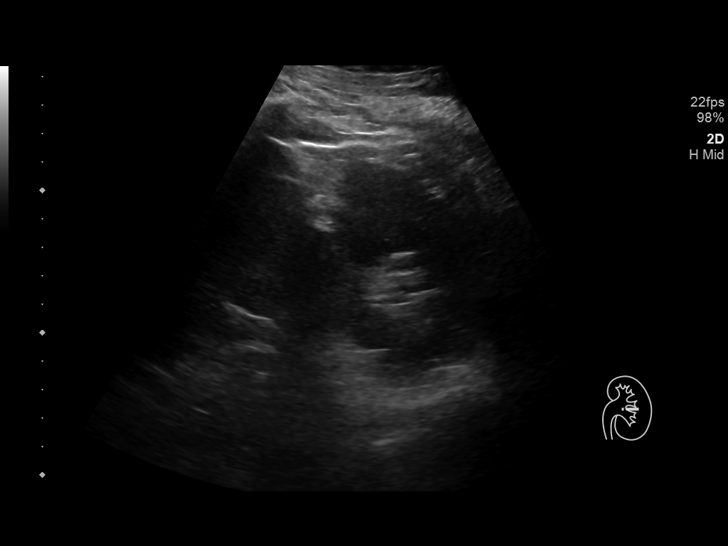
[im 61/67]
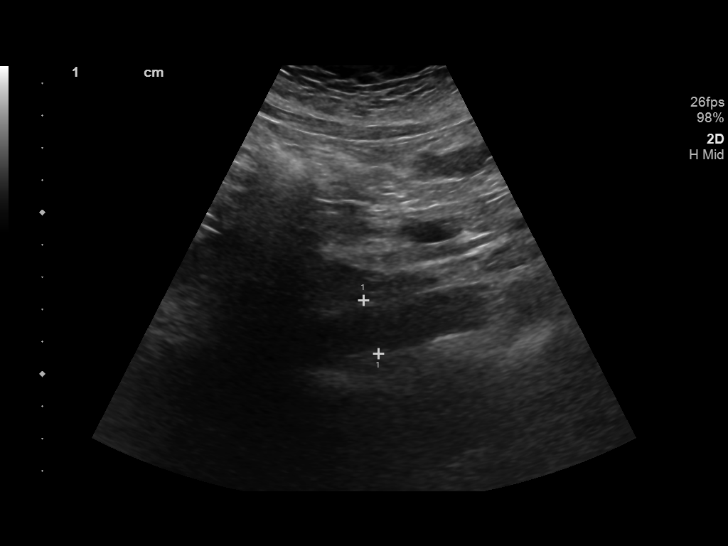
[im 67/67]
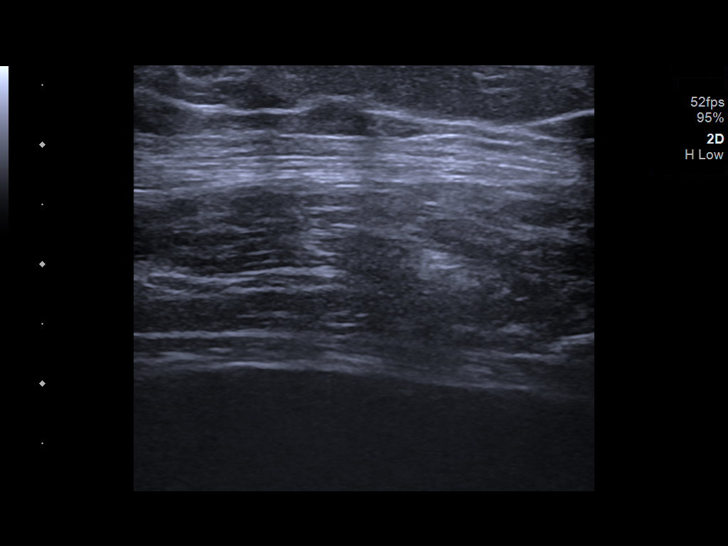

[13 of 25 positions shown; findings below may reference images not displayed]

FINDINGS: Gallbladder: The gallbladder is surgically absent. A previously
demonstrated fluid collection with internal echoes in the
gallbladder fossa is no longer evident.

Common bile duct: Diameter: 4.3 mm

Liver: The hepatic echotexture is heterogeneously increased. The
surface contour is irregular. There is no focal mass nor ductal
dilation. Portal vein is patent on color Doppler imaging with normal
direction of blood flow towards the liver.

IVC: The visualized portion unremarkable.

Pancreas: Visualized portion unremarkable.

Spleen: The spleen is enlarged measuring 17.1 cm in length with a
calculated volume of 9097 cc.

Right Kidney: Length: 13.0 cm. Echogenicity within normal limits. No
mass or hydronephrosis visualized.

Left Kidney: Length: 14.3 cm. Echogenicity within normal limits. No
mass or hydronephrosis visualized.

Abdominal aorta: No aneurysm visualized.

Other findings: There is no ascites.
IMPRESSION: Hepatic parenchymal changes compatible with cirrhosis. No suspicious
masses. No ascites.

Previous cholecystectomy. Interval resolution of a presumed seroma
in the gallbladder fossa.

Limited visualization of the inferior vena cava and pancreas due to
bowel gas.

Splenomegaly with calculated splenic volume of 9097 cc.

## 2019-10-28 ENCOUNTER — Other Ambulatory Visit: Payer: Self-pay | Admitting: Gastroenterology

## 2019-10-28 DIAGNOSIS — K7460 Unspecified cirrhosis of liver: Secondary | ICD-10-CM

## 2019-11-19 ENCOUNTER — Ambulatory Visit (HOSPITAL_COMMUNITY)
Admission: RE | Admit: 2019-11-19 | Discharge: 2019-11-19 | Disposition: A | Discharge status: Home / Self Care | Payer: No Typology Code available for payment source | Source: Ambulatory Visit | Attending: Gastroenterology | Admitting: Gastroenterology

## 2019-11-19 ENCOUNTER — Other Ambulatory Visit: Payer: Self-pay

## 2019-11-19 ENCOUNTER — Other Ambulatory Visit (INDEPENDENT_AMBULATORY_CARE_PROVIDER_SITE_OTHER): Payer: No Typology Code available for payment source

## 2019-11-19 DIAGNOSIS — K7460 Unspecified cirrhosis of liver: Secondary | ICD-10-CM | POA: Diagnosis not present

## 2019-11-19 LAB — CBC WITH DIFFERENTIAL/PLATELET
Basophils Absolute: 0.1 10*3/uL (ref 0.0–0.1)
Basophils Relative: 1.1 % (ref 0.0–3.0)
Eosinophils Absolute: 0.2 10*3/uL (ref 0.0–0.7)
Eosinophils Relative: 4 % (ref 0.0–5.0)
HCT: 43.5 % (ref 39.0–52.0)
Hemoglobin: 15.4 g/dL (ref 13.0–17.0)
Lymphocytes Relative: 24.7 % (ref 12.0–46.0)
Lymphs Abs: 1.3 10*3/uL (ref 0.7–4.0)
MCHC: 35.4 g/dL (ref 30.0–36.0)
MCV: 91.8 fl (ref 78.0–100.0)
Monocytes Absolute: 0.6 10*3/uL (ref 0.1–1.0)
Monocytes Relative: 11 % (ref 3.0–12.0)
Neutro Abs: 3.1 10*3/uL (ref 1.4–7.7)
Neutrophils Relative %: 59.2 % (ref 43.0–77.0)
Platelets: 138 10*3/uL — ABNORMAL LOW (ref 150.0–400.0)
RBC: 4.74 Mil/uL (ref 4.22–5.81)
RDW: 15.1 % (ref 11.5–15.5)
WBC: 5.3 10*3/uL (ref 4.0–10.5)

## 2019-11-19 LAB — COMPREHENSIVE METABOLIC PANEL
ALT: 69 U/L — ABNORMAL HIGH (ref 0–53)
AST: 96 U/L — ABNORMAL HIGH (ref 0–37)
Albumin: 4 g/dL (ref 3.5–5.2)
Alkaline Phosphatase: 139 U/L — ABNORMAL HIGH (ref 39–117)
BUN: 10 mg/dL (ref 6–23)
CO2: 24 mEq/L (ref 19–32)
Calcium: 9.4 mg/dL (ref 8.4–10.5)
Chloride: 107 mEq/L (ref 96–112)
Creatinine, Ser: 0.7 mg/dL (ref 0.40–1.50)
GFR: 124.62 mL/min (ref >60.00)
Glucose, Bld: 128 mg/dL — ABNORMAL HIGH (ref 70–99)
Potassium: 3.9 mEq/L (ref 3.5–5.1)
Sodium: 139 mEq/L (ref 135–145)
Total Bilirubin: 2 mg/dL — ABNORMAL HIGH (ref 0.2–1.2)
Total Protein: 7.6 g/dL (ref 6.0–8.3)

## 2019-11-19 LAB — PROTIME-INR
INR: 1.1 ratio — ABNORMAL HIGH (ref 0.8–1.0)
Prothrombin Time: 13.4 s — ABNORMAL HIGH (ref 9.6–13.1)

## 2019-11-19 LAB — POCT I-STAT CREATININE: Creatinine, Ser: 0.8 mg/dL (ref 0.61–1.24)

## 2019-11-19 MED ORDER — GADOBUTROL 1 MMOL/ML IV SOLN
10.0000 mL | Freq: Once | INTRAVENOUS | Status: AC | PRN
  Administered 2019-11-19: 10 mL via INTRAVENOUS

## 2019-11-20 LAB — AFP TUMOR MARKER: AFP-Tumor Marker: 4.9 ng/mL (ref <6.1)

## 2019-11-26 ENCOUNTER — Encounter: Payer: Self-pay | Admitting: Gastroenterology

## 2019-11-26 ENCOUNTER — Other Ambulatory Visit: Payer: No Typology Code available for payment source

## 2019-11-26 ENCOUNTER — Ambulatory Visit (INDEPENDENT_AMBULATORY_CARE_PROVIDER_SITE_OTHER): Payer: No Typology Code available for payment source | Admitting: Gastroenterology

## 2019-11-26 VITALS — BP 150/90 | HR 60 | Temp 98.4°F | Ht 67.0 in | Wt 221.5 lb

## 2019-11-26 DIAGNOSIS — K7460 Unspecified cirrhosis of liver: Secondary | ICD-10-CM

## 2019-11-26 NOTE — Patient Instructions (Signed)
If you are age 41 or older, your body mass index should be between 23-30. Your Body mass index is 34.69 kg/m. If this is out of the aforementioned range listed, please consider follow up with your Primary Care Provider.  If you are age 73 or younger, your body mass index should be between 19-25. Your Body mass index is 34.69 kg/m. If this is out of the aformentioned range listed, please consider follow up with your Primary Care Provider.   Your provider has requested that you go to the basement level for lab work before leaving today. Press "B" on the elevator. The lab is located at the first door on the left as you exit the elevator.  Dr Ardis Hughs will call you after liver conference next Wednesday.  You will follow up with Dr Ardis Hughs in 6 months.  Please contact our office for appointment.  Thank you, Dr Ardis Hughs

## 2019-11-26 NOTE — Progress Notes (Signed)
Review of pertinent gastrointestinal problems: 1.Cirrhosis.Well compensated, diagnosed incidentally during laparoscopic cholecystectomy.Presumed fatty liver related.Lab work-up 2019:Hepatitis C antibody negative, hepatitis B surface antibody negative, hepatitis B surface antigen negative, hepatitis A antibody nonreactive, iron studies normal,ceruloplasmin slightly elevated at 39,alpha 1 antitrypsin level slightly elevated at 231, anti-smooth muscle antibody negative, AMA negative, ANA negative.  Hep A/B immunization startedfall 2019  EGDOctober 2019 was normal.Should repeat October 2022 for repeat variceal screening  Hepatoma screening;October 2019 ultrasound showed cirrhosis without obvious mass lesions. October 2019 alpha-fetoprotein slightly elevated at 6.7; MRI 10/2018 " markedly nodular liver" no suspicious mass lesions.  04/2019 [place] "new focal masslike area in the anterior segment of the right lobe of the liver measuring 2.4 x 2.7 cm.  This may well represent a dysplastic type nodule" the radiologist recommended an MRI as follow-up.  AFP 11/2019 normal  MELD-NA 11/2019 labs is 10  Liver biopsy during lap chole 02/2018: "early" cirrhosis, no clear etiology based on path    HPI: This is a very pleasant 39 year old man whom I last saw by telemedicine visit July 2020.  Has had a bit of fatigue and some intermittent nausea.  The nausea can be better after he eats, sometimes worse after he eats.  Sometimes it occurs after a bowel movement.  No vomiting.  No significant abdominal pains.  No troubles with edema or ascites.  No jaundice.  Chief complaint is cirrhosis    MRI 05/2019:  Micro and macronodular cirrhosis, as described above.Sonographic abnormality likely corresponds to a prominent macronodular contour along the anterior/inferior aspect of segment 4B, unchanged from prior MRI, without findings suspicious for neoplasm. No findings specific for hepatocellular carcinoma. A 9 mm  lesion in segment 8 warrants attention on follow-up in 6-12 months. Splenomegaly. Small upper abdominal lymph nodes, likely reactive. Portal vein is patent. No abdominal ascites.   MRI 11/2019 1. Cirrhosis with widespread cirrhosis-associated nodularity. Segment 8 right liver lobe 1.2 cm mass without arterial enhancement, LI-RADS category 4 (probably [place]), unchanged in size. No new liver Lesions. 2. Moderate splenomegaly. No ascites. Small paraumbilical and lower esophageal varices.  Blood work last week shows current meld score 10, essentially unchanged from several months ago.   ROS: complete GI ROS as described in HPI, all other review negative.  Constitutional:  No unintentional weight loss   Past Medical History:  Diagnosis Date  . Diabetes ([place])   . Fatty liver   . Gallstones   . GERD (gastroesophageal reflux disease)   . Obesity     Past Surgical History:  Procedure Laterality Date  . BACK SURGERY    . CHOLECYSTECTOMY      Current Outpatient Medications  Medication Sig Dispense Refill  . acetaminophen (TYLENOL) 325 MG tablet Take 650 mg by mouth as needed.    . metFORMIN (GLUCOPHAGE) 500 MG tablet Take by mouth 2 (two) times daily with a meal.    . Multiple Vitamins-Minerals (MULTIVITAMIN ADULT) TABS Take 1 tablet by mouth daily.     No current facility-administered medications for this visit.    Allergies as of 11/26/2019  . (No Known Allergies)    Family History  Problem Relation Age of Onset  . Diabetes Father   . Heart disease Father   . Diabetes Maternal Grandmother     Social History   Socioeconomic History  . Marital status: Married    Spouse name: Not on file  . Number of children: 1  . Years of education: Not on file  . Highest education  level: Not on file  Occupational History  . Occupation: delivery driver  Tobacco Use  . Smoking status: Never Smoker  . Smokeless tobacco: Never Used  Substance and Sexual Activity  . Alcohol use: Not  Currently    Comment: last drink in June 2019  . Drug use: Never  . Sexual activity: Yes    Partners: Female  Other Topics Concern  . Not on file  Social History Narrative  . Not on file   Social Determinants of Health   Financial Resource Strain:   . Difficulty of Paying Living Expenses: Not on file  Food Insecurity:   . Worried About Charity fundraiser in the Last Year: Not on file  . Ran Out of Food in the Last Year: Not on file  Transportation Needs:   . Lack of Transportation (Medical): Not on file  . Lack of Transportation (Non-Medical): Not on file  Physical Activity:   . Days of Exercise per Week: Not on file  . Minutes of Exercise per Session: Not on file  Stress:   . Feeling of Stress : Not on file  Social Connections:   . Frequency of Communication with Friends and Family: Not on file  . Frequency of Social Gatherings with Friends and Family: Not on file  . Attends Religious Services: Not on file  . Active Member of Clubs or Organizations: Not on file  . Attends Archivist Meetings: Not on file  . Marital Status: Not on file  Intimate Partner Violence:   . Fear of Current or Ex-Partner: Not on file  . Emotionally Abused: Not on file  . Physically Abused: Not on file  . Sexually Abused: Not on file     Physical Exam: BP (!) 150/90 (BP Location: Left Arm, Patient Position: Sitting, Cuff Size: Normal)   Pulse 60   Temp 98.4 F (36.9 C)   Ht 5\' 7"  (1.702 m) Comment: height measured without shoes  Wt 221 lb 8 oz (100.5 kg)   BMI 34.69 kg/m  Constitutional: generally well-appearing Psychiatric: alert and oriented x3 Abdomen: soft, nontender, nondistended, no obvious ascites, no peritoneal signs, normal bowel sounds No peripheral edema noted in lower extremities  Assessment and plan: 39 y.o. male with cirrhosis  First he has a 1.2 cm area in his segment 8 of his liver that is unusual.  Was first noted by MRI 6 months ago.  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  He and I discussed this at length the implications and possible treatment algorithms.  His case is on for discussion at next week's multidisciplinary GI tumor board and he knows I will call him after that conference to let them know group consensus and plan.  His nausea is of unclear etiology.  Bradycardia stool testing for H. pylori and if it is positive we will treat appropriately.  If it is negative I will likely recommend a empiric trial of over-the-counter proton pump inhibitor once daily given the fact that GERD and acid related issues can certainly cause intermittent nausea.  At the very least he will return to see me in 6 months sooner if needed.  Please see the "Patient Instructions" section for addition details about the plan.  Owens Loffler, MD [facility] 11/26/2019, 3:15 PM   Total time on date of encounter was 31 minutes  (this included time spent preparing to see the patient reviewing records; obtaining and/or reviewing separately obtained  history; performing a medically appropriate exam and/or evaluation; counseling and educating the patient and family if present; ordering medications, tests or procedures if applicable; and documenting clinical information in the health record).

## 2019-11-27 ENCOUNTER — Other Ambulatory Visit: Payer: No Typology Code available for payment source

## 2019-11-27 DIAGNOSIS — K7460 Unspecified cirrhosis of liver: Secondary | ICD-10-CM

## 2019-11-28 LAB — HELICOBACTER PYLORI  SPECIAL ANTIGEN
MICRO NUMBER:: 10066454
SPECIMEN QUALITY: ADEQUATE

## 2019-12-02 ENCOUNTER — Telehealth: Payer: Self-pay | Admitting: Gastroenterology

## 2019-12-02 MED ORDER — OMEPRAZOLE 20 MG PO CPDR: 20.0000 mg | DELAYED_RELEASE_CAPSULE | Freq: Every day | ORAL | 6 refills | Status: DC

## 2019-12-02 NOTE — Telephone Encounter (Signed)
Yes, thanks

## 2019-12-02 NOTE — Telephone Encounter (Signed)
Is it OK to send Rx for Omeprazole 20mg  once daily?

## 2019-12-02 NOTE — Telephone Encounter (Signed)
Rx sent to pharmacy as requested.

## 2019-12-02 NOTE — Telephone Encounter (Signed)
[facility] called to inform that it is cheaper for pt to get a prescription for omeprazole 20 mg than buying it OTC.

## 2019-12-03 ENCOUNTER — Telehealth: Payer: Self-pay | Admitting: Gastroenterology

## 2019-12-03 NOTE — Telephone Encounter (Signed)
Staff message to call pt in 6 months for repeat MRI

## 2019-12-03 NOTE — Telephone Encounter (Signed)
I left a voicemail for him to call back to discuss the GI tumor board conference recommendations from this morning.  Essentially the board recommended repeat MRI at 6 months.  The radiologist who was reviewing the case thought that the "lesion" was more likely safe to be considered at [place] 3 rather than Li RADS 4.

## 2019-12-03 NOTE — Telephone Encounter (Signed)
We discussed today's GI tumor board implications.  Colin Cooley, He needs repeat MRI of the liver in 6 months as screening for hepatoma and following segment 8 "lesion" thanks

## 2019-12-18 DIAGNOSIS — K7460 Unspecified cirrhosis of liver: Principal | ICD-10-CM

## 2020-03-29 ENCOUNTER — Encounter: Admit: 2020-03-29 | Discharge: 2020-03-29 | Payer: BLUE CROSS/BLUE SHIELD

## 2020-03-29 ENCOUNTER — Encounter
Admit: 2020-03-29 | Discharge: 2020-03-29 | Payer: BLUE CROSS/BLUE SHIELD | Attending: Gastroenterology | Primary: Gastroenterology

## 2020-03-29 DIAGNOSIS — K760 Fatty (change of) liver, not elsewhere classified: Principal | ICD-10-CM

## 2020-03-29 DIAGNOSIS — K7460 Unspecified cirrhosis of liver: Principal | ICD-10-CM

## 2020-03-29 DIAGNOSIS — K769 Liver disease, unspecified: Principal | ICD-10-CM

## 2020-03-29 MED ORDER — BACLOFEN 10 MG TABLET
ORAL_TABLET | Freq: Every evening | ORAL | 5 refills | 30.00000 days | Status: CP
Start: 2020-03-29 — End: 2020-04-28

## 2020-04-01 ENCOUNTER — Ambulatory Visit: Admit: 2020-04-01 | Discharge: 2020-04-01 | Payer: 59

## 2020-04-08 ENCOUNTER — Ambulatory Visit: Admit: 2020-04-08 | Discharge: 2020-04-09 | Payer: 59

## 2020-04-20 ENCOUNTER — Other Ambulatory Visit: Payer: Self-pay

## 2020-04-20 MED ORDER — OMEPRAZOLE 20 MG PO CPDR: 20.0000 mg | DELAYED_RELEASE_CAPSULE | Freq: Every day | ORAL | 1 refills | Status: AC

## 2020-05-05 DIAGNOSIS — K7460 Unspecified cirrhosis of liver: Principal | ICD-10-CM

## 2020-05-05 DIAGNOSIS — Z129 Encounter for screening for malignant neoplasm, site unspecified: Principal | ICD-10-CM

## 2020-05-05 DIAGNOSIS — K769 Liver disease, unspecified: Principal | ICD-10-CM

## 2020-06-01 ENCOUNTER — Telehealth: Payer: Self-pay

## 2020-06-01 NOTE — Telephone Encounter (Signed)
Spoke with the pt and he tells me that he has transferred his care to [facility].

## 2020-06-01 NOTE — Telephone Encounter (Signed)
-----   Message from Timothy Lasso, RN sent at 12/03/2019 12:03 PM EST ----- Chong Sicilian, He needs repeat MRI of the liver in 6 months as screening for hepatoma and following segment 8 "lesion" thanks

## 2020-07-29 MED ORDER — METFORMIN ER 500 MG TABLET,EXTENDED RELEASE 24 HR
Freq: Two times a day (BID) | ORAL | 0 days
Start: 2020-07-29 — End: ?

## 2020-08-20 ENCOUNTER — Telehealth: Payer: Self-pay

## 2020-08-20 ENCOUNTER — Ambulatory Visit
Admission: EM | Admit: 2020-08-20 | Discharge: 2020-08-20 | Disposition: A | Discharge status: Home / Self Care | Payer: PRIVATE HEALTH INSURANCE

## 2020-08-20 ENCOUNTER — Other Ambulatory Visit: Payer: Self-pay

## 2020-08-20 DIAGNOSIS — R609 Edema, unspecified: Secondary | ICD-10-CM

## 2020-08-20 HISTORY — DX: Unspecified cirrhosis of liver: K74.60

## 2020-08-20 LAB — COMPREHENSIVE METABOLIC PANEL
ALT: 90 IU/L — ABNORMAL HIGH (ref 0–44)
AST: 220 IU/L — ABNORMAL HIGH (ref 0–40)
Albumin/Globulin Ratio: 1.1 — ABNORMAL LOW (ref 1.2–2.2)
Albumin: 3.5 g/dL — ABNORMAL LOW (ref 4.0–5.0)
Alkaline Phosphatase: 277 IU/L — ABNORMAL HIGH (ref 44–121)
BUN/Creatinine Ratio: 28 — ABNORMAL HIGH (ref 9–20)
BUN: 17 mg/dL (ref 6–24)
Bilirubin Total: 5.1 mg/dL — ABNORMAL HIGH (ref 0.0–1.2)
CO2: 21 mmol/L (ref 20–29)
Calcium: 8.8 mg/dL (ref 8.7–10.2)
Chloride: 108 mmol/L — ABNORMAL HIGH (ref 96–106)
Creatinine, Ser: 0.6 mg/dL — ABNORMAL LOW (ref 0.76–1.27)
GFR calc Af Amer: 144 mL/min/{1.73_m2} (ref >59)
GFR calc non Af Amer: 125 mL/min/{1.73_m2} (ref >59)
Globulin, Total: 3.3 g/dL (ref 1.5–4.5)
Glucose: 96 mg/dL (ref 65–99)
Potassium: 4.3 mmol/L (ref 3.5–5.2)
Sodium: 139 mmol/L (ref 134–144)
Total Protein: 6.8 g/dL (ref 6.0–8.5)

## 2020-08-20 LAB — CBC WITH DIFFERENTIAL/PLATELET
Basophils Absolute: 0 10*3/uL (ref 0.0–0.2)
Basos: 1 %
EOS (ABSOLUTE): 0.2 10*3/uL (ref 0.0–0.4)
Eos: 5 %
Hematocrit: 40 % (ref 37.5–51.0)
Hemoglobin: 14.2 g/dL (ref 13.0–17.7)
Lymphocytes Absolute: 1.3 10*3/uL (ref 0.7–3.1)
Lymphs: 31 %
MCH: 32.2 pg (ref 26.6–33.0)
MCHC: 35.5 g/dL (ref 31.5–35.7)
MCV: 91 fL (ref 79–97)
Monocytes Absolute: 0.5 10*3/uL (ref 0.1–0.9)
Monocytes: 12 %
Neutrophils Absolute: 2.2 10*3/uL (ref 1.4–7.0)
Neutrophils: 51 %
Platelets: 148 10*3/uL — ABNORMAL LOW (ref 150–450)
RBC: 4.41 x10E6/uL (ref 4.14–5.80)
RDW: 15.9 % — ABNORMAL HIGH (ref 11.6–15.4)
WBC: 4.2 10*3/uL (ref 3.4–10.8)

## 2020-08-20 MED ORDER — SPIRONOLACTONE 25 MG PO TABS: 25.0000 mg | ORAL_TABLET | Freq: Every day | ORAL | 0 refills | Status: AC

## 2020-08-20 MED ORDER — SPIRONOLACTONE 25 MG PO TABS: 25.0000 mg | ORAL_TABLET | Freq: Every day | ORAL | 0 refills | Status: DC

## 2020-08-20 MED ORDER — FUROSEMIDE 40 MG PO TABS: 40.0000 mg | ORAL_TABLET | Freq: Every day | ORAL | 0 refills | Status: DC

## 2020-08-20 MED ORDER — FUROSEMIDE 40 MG PO TABS: 40.0000 mg | ORAL_TABLET | Freq: Every day | ORAL | 0 refills | Status: AC

## 2020-08-20 MED ORDER — SPIRONOLACTONE 25 MG TABLET
0 days
Start: 2020-08-20 — End: 2020-09-06

## 2020-08-20 MED ORDER — FUROSEMIDE 40 MG TABLET
ORAL | 0 days
Start: 2020-08-20 — End: 2020-09-06

## 2020-08-20 NOTE — ED Provider Notes (Addendum)
[facility]    CSN: [id number] Arrival date & time: 08/20/20  0851      History   Chief Complaint Chief Complaint  Patient presents with  . fluid retension    HPI Colin Cooley is a 39 y.o. male.   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 States weighs himself daily, and has gained approx 8-10lbs in the past 5 days, most noticeable the past 2 days. Has had abdominal pressure, leg swelling. Occasional shortness of breath. Denies DOE, orthopnea. Has intermittent RUQ pain radiating to right shoulder due to liver cirrhosis, which is normal for him, but states has been more frequent. Denies fever. Normal BMs. S/p cholecystectomy.      Past Medical History:  Diagnosis Date  . Diabetes ([place])   . Fatty liver   . Gallstones   . GERD (gastroesophageal reflux disease)   . Liver cirrhosis secondary to nonalcoholic steatohepatitis (NASH) ([place])   . Obesity     There are no problems to display for this patient.   Past Surgical History:  Procedure Laterality Date  . BACK SURGERY    . CHOLECYSTECTOMY         Home Medications    Prior to Admission medications   Medication Sig Start Date End Date Taking? Authorizing Provider  baclofen (LIORESAL) 10 MG tablet Take 10 mg by mouth 2 (two) times daily.   Yes [provider]  acetaminophen (TYLENOL) 325 MG tablet Take 650 mg by mouth as needed.    [provider]  furosemide (LASIX) 40 MG tablet Take 1 tablet (40 mg total) by mouth daily. 08/20/20   Tasia Catchings, Charl Wellen V, PA-C  metFORMIN (GLUCOPHAGE) 500 MG tablet Take by mouth 2 (two) times daily with a meal.    [provider]  Multiple Vitamins-Minerals (MULTIVITAMIN ADULT) TABS Take 1 tablet by mouth daily.    [provider]  omeprazole (PRILOSEC) 20 MG capsule Take 1 capsule (20 mg total) by mouth daily. 04/20/20   Milus Banister, MD  spironolactone (ALDACTONE) 25 MG  tablet Take 1 tablet (25 mg total) by mouth daily. 08/20/20   Ok Edwards, PA-C    Family History Family History  Problem Relation Age of Onset  . Diabetes Father   . Heart disease Father   . Diabetes Maternal Grandmother     Social History Social History   Tobacco Use  . Smoking status: Never Smoker  . Smokeless tobacco: Never Used  Vaping Use  . Vaping Use: Never used  Substance Use Topics  . Alcohol use: Not Currently    Comment: last drink in June 2019  . Drug use: Never     Allergies   Patient has no known allergies.   Review of Systems Review of Systems  Reason unable to perform ROS: See HPI as above.     Physical Exam Triage Vital Signs ED Triage Vitals [08/20/20 0911]  Enc Vitals Group     BP (!) 162/86     Pulse Rate 73     Resp 18     Temp 98.4 F (36.9 C)     Temp Source Oral     SpO2 97 %     Weight      Height      Head Circumference      Peak Flow      Pain Score 0     Pain Loc  Pain Edu?      Excl. in [place]?    No data found.  Updated Vital Signs BP (!) 162/86 (BP Location: Left Arm)   Pulse 73   Temp 98.4 F (36.9 C) (Oral)   Resp 18   Wt 218 lb 3.2 oz (99 kg)   SpO2 97%   BMI 34.17 kg/m   Physical Exam Constitutional:      General: He is not in acute distress.    Appearance: Normal appearance. He is not ill-appearing, toxic-appearing or diaphoretic.  HENT:     Head: Normocephalic and atraumatic.  Eyes:     General: No scleral icterus.    Extraocular Movements: Extraocular movements intact.     Conjunctiva/sclera: Conjunctivae normal.     Pupils: Pupils are equal, round, and reactive to light.  Cardiovascular:     Rate and Rhythm: Normal rate and regular rhythm.  Pulmonary:     Effort: Pulmonary effort is normal. No respiratory distress.     Comments: LCTAB Abdominal:     General: Bowel sounds are normal.     Palpations: Abdomen is soft.     Tenderness: There is no abdominal tenderness. There is no right CVA  tenderness, left CVA tenderness, guarding or rebound.  Musculoskeletal:     Cervical back: Normal range of motion and neck supple.     Comments: 1+ pitting edema to mid shin bilaterally. No erythema, warmth. No tenderness to palpation. Pedal pulse 2+  Skin:    General: Skin is warm and dry.  Neurological:     Mental Status: He is alert and oriented to person, place, and time.      UC Treatments / Results  Labs (all labs ordered are listed, but only abnormal results are displayed) Labs Reviewed  CBC WITH DIFFERENTIAL/PLATELET - Abnormal; Notable for the following components:      Result Value   RDW 15.9 (*)    Platelets 148 (*)    All other components within normal limits   Narrative:    Performed at:  [facility] [address]  [place], [place], [place]  [id number] Lab Director: Lindon Romp MD, Phone:  [phone]  COMPREHENSIVE METABOLIC PANEL - Abnormal; Notable for the following components:   Creatinine, Ser 0.60 (*)    BUN/Creatinine Ratio 28 (*)    Chloride 108 (*)    Albumin 3.5 (*)    Albumin/Globulin Ratio 1.1 (*)    Bilirubin Total 5.1 (*)    Alkaline Phosphatase 277 (*)    AST 220 (*)    ALT 90 (*)    All other components within normal limits   Narrative:    Performed at:  [facility], [place], [place]  [id number] Lab Director: Lindon Romp MD, Phone:  [phone]    EKG   Radiology No results found.  Procedures Procedures (including critical care time)  Medications Ordered in UC Medications - No data to display  Initial Impression / Assessment and Plan / UC Course  I have reviewed the triage vital signs and the nursing notes.  Pertinent labs & imaging results that were available during my care of the patient were reviewed by me and considered in my medical decision making (see chart for details).    Discussed case with Dr Lanny Cramp. Patient afebrile, nontoxic. Abdomen not obviously  distended, soft, +BS, nontender to palpation. 1+ pitting edema to BLE. Will draw CBC, CMP for further evaluation. Otherwise  will start spironolactone and lasix for management. GI follow up in 1 week. Return precautions given. Patient expresses understanding and agrees to plan. Discharged in stable condition pending lab results.  CMP results shows overall increase in liver enzymes.  CBC stable.  Patient's GI office contacted, informed Harriette Bouillon of recent lab results.  GI will contact patient for follow-up and any changes in treatment.  At this time, will continue spironolactone and Lasix as per plan above.   Patient was contacted by RN in office of current lab results and updated plan, please see her notes for details.  Final Clinical Impressions(s) / UC Diagnoses   Final diagnoses:  Fluid retention   ED Prescriptions    Medication Sig Dispense Auth. Provider   spironolactone (ALDACTONE) 25 MG tablet Take 1 tablet (25 mg total) by mouth daily. 7 tablet Kasen Sako V, PA-C   furosemide (LASIX) 40 MG tablet Take 1 tablet (40 mg total) by mouth daily. 7 tablet Ok Edwards, PA-C     PDMP not reviewed this encounter.   Ok Edwards, PA-C 08/20/20 [place], Wendie Diskin V, PA-C 08/20/20 [phone]

## 2020-08-20 NOTE — ED Triage Notes (Signed)
Pt c/o fluid retention x2 days to abdomen and bilateral ankle/feet. States had a root canaul on Monday and had to take ibuprofen for 2 days. States hx of cirrhosis of the liver.

## 2020-08-20 NOTE — Discharge Instructions (Signed)
We have drawn labs to further evaluation current symptoms. Start lasix and spironolactone as directed. Continue daily weight diary. Monitor salt intake. Follow up with GI in 1 week for recheck. If having increasing abdominal pain, fever (temp >100.4), weakness, dizziness, significant shortness of breath, go to the emergency department for further evaluation.

## 2020-08-28 MED ORDER — BACLOFEN 10 MG TABLET
0.00000 days
Start: 2020-08-28 — End: 2020-10-20

## 2020-09-03 MED ORDER — CENTRUM ORAL
0 days
Start: 2020-09-03 — End: ?

## 2020-09-06 ENCOUNTER — Ambulatory Visit: Admit: 2020-09-06 | Discharge: 2020-09-07 | Attending: Gastroenterology | Primary: Gastroenterology

## 2020-09-06 DIAGNOSIS — I8510 Secondary esophageal varices without bleeding: Principal | ICD-10-CM

## 2020-09-06 DIAGNOSIS — K766 Portal hypertension: Principal | ICD-10-CM

## 2020-09-06 DIAGNOSIS — K3189 Other diseases of stomach and duodenum: Principal | ICD-10-CM

## 2020-09-06 DIAGNOSIS — E119 Type 2 diabetes mellitus without complications: Principal | ICD-10-CM

## 2020-09-06 DIAGNOSIS — K7460 Unspecified cirrhosis of liver: Principal | ICD-10-CM

## 2020-09-06 MED ORDER — FUROSEMIDE 20 MG TABLET
ORAL_TABLET | Freq: Every day | ORAL | 3 refills | 90 days | Status: CP
Start: 2020-09-06 — End: ?

## 2020-09-06 MED ORDER — LACTULOSE 10 GRAM/15 ML ORAL SOLUTION
Freq: Three times a day (TID) | ORAL | 0 refills | 3.00000 days | Status: CP
Start: 2020-09-06 — End: 2020-09-23

## 2020-09-06 MED ORDER — SPIRONOLACTONE 50 MG TABLET
ORAL_TABLET | Freq: Every day | ORAL | 3 refills | 90.00000 days | Status: CP
Start: 2020-09-06 — End: ?

## 2020-09-09 DIAGNOSIS — K766 Portal hypertension: Principal | ICD-10-CM

## 2020-09-09 DIAGNOSIS — E119 Type 2 diabetes mellitus without complications: Principal | ICD-10-CM

## 2020-09-09 DIAGNOSIS — I8510 Secondary esophageal varices without bleeding: Principal | ICD-10-CM

## 2020-09-09 DIAGNOSIS — K3189 Other diseases of stomach and duodenum: Principal | ICD-10-CM

## 2020-09-10 ENCOUNTER — Ambulatory Visit: Admit: 2020-09-10 | Discharge: 2020-09-10

## 2020-09-10 ENCOUNTER — Encounter
Admit: 2020-09-10 | Discharge: 2020-09-10 | Attending: Student in an Organized Health Care Education/Training Program | Primary: Student in an Organized Health Care Education/Training Program

## 2020-09-10 DIAGNOSIS — K766 Portal hypertension: Principal | ICD-10-CM

## 2020-09-10 DIAGNOSIS — I8510 Secondary esophageal varices without bleeding: Principal | ICD-10-CM

## 2020-09-10 DIAGNOSIS — E119 Type 2 diabetes mellitus without complications: Principal | ICD-10-CM

## 2020-09-10 DIAGNOSIS — K3189 Other diseases of stomach and duodenum: Principal | ICD-10-CM

## 2020-09-15 ENCOUNTER — Encounter: Admit: 2020-09-15 | Discharge: 2020-09-16 | Payer: PRIVATE HEALTH INSURANCE

## 2020-09-15 DIAGNOSIS — Z129 Encounter for screening for malignant neoplasm, site unspecified: Principal | ICD-10-CM

## 2020-09-15 DIAGNOSIS — K766 Portal hypertension: Principal | ICD-10-CM

## 2020-09-15 DIAGNOSIS — I868 Varicose veins of other specified sites: Principal | ICD-10-CM

## 2020-09-15 DIAGNOSIS — K7460 Unspecified cirrhosis of liver: Principal | ICD-10-CM

## 2020-09-15 DIAGNOSIS — R161 Splenomegaly, not elsewhere classified: Principal | ICD-10-CM

## 2020-09-15 DIAGNOSIS — K769 Liver disease, unspecified: Principal | ICD-10-CM

## 2020-09-15 DIAGNOSIS — Z1289 Encounter for screening for malignant neoplasm of other sites: Principal | ICD-10-CM

## 2020-09-18 DIAGNOSIS — K769 Liver disease, unspecified: Principal | ICD-10-CM

## 2020-09-18 DIAGNOSIS — I8500 Esophageal varices without bleeding: Principal | ICD-10-CM

## 2020-09-18 DIAGNOSIS — K7460 Unspecified cirrhosis of liver: Principal | ICD-10-CM

## 2020-09-23 MED ORDER — OMEPRAZOLE 20 MG CAPSULE,DELAYED RELEASE
ORAL_CAPSULE | Freq: Every day | ORAL | 5 refills | 30.00000 days | Status: CP
Start: 2020-09-23 — End: 2020-10-04

## 2020-09-23 MED ORDER — LACTULOSE 10 GRAM/15 ML ORAL SOLUTION
Freq: Three times a day (TID) | ORAL | 5 refills | 30 days | Status: CP
Start: 2020-09-23 — End: 2021-03-22

## 2020-10-04 MED ORDER — OMEPRAZOLE 20 MG CAPSULE,DELAYED RELEASE
ORAL_CAPSULE | Freq: Every day | ORAL | 2 refills | 30.00000 days | Status: CP
Start: 2020-10-04 — End: 2021-01-02

## 2020-10-20 MED ORDER — BACLOFEN 10 MG TABLET
ORAL_TABLET | ORAL | 5 refills | 0.00000 days | Status: CP
Start: 2020-10-20 — End: ?

## 2020-11-11 DIAGNOSIS — Z0000 Encounter for general adult medical examination without abnormal findings: Secondary | ICD-10-CM | POA: Diagnosis not present

## 2020-11-11 DIAGNOSIS — K760 Fatty (change of) liver, not elsewhere classified: Secondary | ICD-10-CM | POA: Diagnosis not present

## 2020-11-11 DIAGNOSIS — Z125 Encounter for screening for malignant neoplasm of prostate: Secondary | ICD-10-CM | POA: Diagnosis not present

## 2020-11-17 DIAGNOSIS — R82998 Other abnormal findings in urine: Secondary | ICD-10-CM | POA: Diagnosis not present

## 2020-11-17 DIAGNOSIS — Z0000 Encounter for general adult medical examination without abnormal findings: Secondary | ICD-10-CM | POA: Diagnosis not present

## 2020-11-17 DIAGNOSIS — E1165 Type 2 diabetes mellitus with hyperglycemia: Secondary | ICD-10-CM | POA: Diagnosis not present

## 2020-12-09 ENCOUNTER — Encounter
Admit: 2020-12-09 | Discharge: 2020-12-10 | Payer: PRIVATE HEALTH INSURANCE | Attending: Gastroenterology | Primary: Gastroenterology

## 2020-12-09 DIAGNOSIS — K7581 Nonalcoholic steatohepatitis (NASH): Principal | ICD-10-CM

## 2020-12-09 DIAGNOSIS — K7290 Hepatic failure, unspecified without coma: Principal | ICD-10-CM

## 2020-12-09 DIAGNOSIS — K7460 Unspecified cirrhosis of liver: Principal | ICD-10-CM

## 2020-12-09 DIAGNOSIS — I8510 Secondary esophageal varices without bleeding: Principal | ICD-10-CM

## 2020-12-09 DIAGNOSIS — E669 Obesity, unspecified: Principal | ICD-10-CM

## 2020-12-09 MED ORDER — RIFAXIMIN 550 MG TABLET
ORAL_TABLET | Freq: Two times a day (BID) | ORAL | 3 refills | 90 days | Status: CP
Start: 2020-12-09 — End: ?
  Filled 2021-03-02: qty 180, 90d supply, fill #0

## 2020-12-09 NOTE — Unmapped (Signed)
[facility] at [facility] Visit    Reason for Visit: Follow-up of Cirrhosis  Primary Care Provider: Gaspar Garbe, MD    Assessment & Plan: Derek Boyer is a 40 y.o. male with a PMHx of T2DM, obesity, HTN and NASH cirrhosis c/b non-bleeding G1EVs, HE, and ascites that presents for follow-up of NASH cirrhosis.    NASH Cirrhosis: Complicated by non-bleeding Grade I EVs, hepatic encephalopathy, and ascites/BLE edema. Today, he appears euvolemic on exam and reports good control of his volume status with his low-dose diuretic regimen. Major issue from a liver perspective today is hepatic encephalopathy reported by both the patient and his wife. He is relatively clear today and without asterixis, but he and his wife both report symptoms tend to be worse in the afternoon. He is at goal with bowel movements on lactulose and as such we will add rifaximin today. Labs will be updated and will order MRI for May to monitor LR-3 lesion seen on prior exam.   -- Update MELD labs today, obtain T&S  -- Furosemide 20mg  daily  -- Spironolactone 50mg  daily  -- Lactulose titrated to 3-5 BMs daily  -- Start rifaximin 550mg  BID  -- Continue strength-training exercises and protein supplements  -- If his MELD remains >15, will move toward transplant evaluation for internal listing/approval    Health Maintenance in Cirrhosis:   -- HCC Screening: UTD - Next Due 03/2021 (ordered today)  -- Varices Screening: UTD - Next Due 09/2022 (G1EVs on last scope)  -- Colon Cancer Screening: Start at age 39  -- Osteoporosis Screening: Check Vitamin D level with today's labs    Vaccines in Cirrhosis:   -- Hepatitis A: Immune  -- Hepatitis B: Immune  -- Pneumococcal: PCV-13 ordered today, s/p PPSV-23 (08/2019)  -- Shingles: Due at age 32  -- COVID-19: S/p primary series + booster     Patient seen and examined with Derek Boyer who is in agreement with above assessment and plan.     Return in about 3 months (around 03/08/2021) for follow-up NASH cirrhosis.       Subjective   HPI: Derek Boyer is a 40 y.o. male with a PMHx of T2DM, obesity, HTN and NASH cirrhosis c/b non-bleeding G1EVs, HE, and ascites that is seen in follow-up for NASH cirrhosis.    Prior History: He has family history of NAFLD. He has never had significant ETOH intake and none since he was told her had liver disease. His aunt and brother have fatty liver. No other family history of liver disease or GI cancers. His peak weight was 260. Per outside records, 4/21 labs w/ Tbili 4.1, AST 131, ALT 78, alk phos 153. He was seen by Derek Boyer for liver, but wanted second opinion so referred to [facility]. Cirrhosis incidentally noted during lap chole (liver biopsy path showed early cirrhosis with no clear etiology). 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 Presumed NAFLD. W/u with negative hepatitis studies, normal iron panel and ferritin, ceruloplasmin 39, A1AT 231, ASMA neg, AMA neg, ANA neg. Started hep A/B immunization 2019, which are complete. EGD 2019 w/o varices, due 08/2021 for repeat EGD. 04/2019 US showed new masslike area and f/u MRI 05/2019 showed 9mm lesion in segment 8 with MRI 11/2019 showing that lesion 1.2cm.   ??  At last clinic visit 03/29/20, planned for labs and repeat MRI. The over read at [facility] from outside MRI noted Hypoenhancing 1.0 cm hepatic segment 8 lesion as above, unchanged dating back to 05/12/2019 and without other T2/DWI signal abnormality. 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 5/24 labs showed Tbili 3.7, direct bili 2.4, AST 152, ALT 88, Alk phos 171, AFP 4, INR 1.21, albumin 3.1, plts 115. Per telephone notes, discussion at [facility] conference was that a segment 8 lesion was thought to be regenerative nodule so recommended f/u MRI in 6 months. Due 10/2020. Per phone note, patient called in to say that he was retaining fluid. He had also been having nausea and was started on PPI, which helped some. Only occasional nausea.   ??  Since LCV, he has continued to have fatigue, loss of appetite. In 03/2020 he weighed 220 which is where his weight had been. Now he weighs 199. A couple weeks prior to this appointment, he noticed that he had accumulated fluid in his abdomen and gained 7lbs. Also BLE edema. He went to urgent care and they started him on lasix 40mg /spironolactone 25mg  every day. Then seen by PCP who said continue the diuretics. Then fluid went away and weight went back to normal a few days ago so stopped lasix and just taking spironolactone now. Also has had conjunctiva icterus. He is so fatigued that feels like sleeping a lot of the time and has had to take time off work. He has also had brain fog and now having trouble caring for 42 year old, which started in last 2 weeks. More irritable. Cramping in legs at night. Used to have pruritus, but not as much any more. BM twice daily. Change in color of urine. Only recent infection was a dental abscess for which he took amoxicillin. Denies skin rash, shortness of breath, vomiting, diarrhea, constipation, hematochezia, melena.    Interim History: Derek Boyer presents today to clinic accompanied by his wife who contributes to the history. Overall, he is doing okay. He reports improvement in his volume status with starting Lasix and spironolactone. He denies any significant BLE edema or concern for ascites. He and his wife both report issues with confusion. She says that he has good days and then some bad days. The confusion occurs most days of the week, she can tell just talking to him on the phone how he is doing. She also gives an example of him stopping at an intersection while there were driving, but there was no stop sign. He reports he is more often confused in the afternoon. He often naps in the afternoon as well due to tiredness. He reports some nausea without vomiting after meals. Having 3-4 BMs daily on lactulose. Denies interim hospitalizations.     The following items in the patient's electronic medical record were reviewed and updated as needed: past medical/surgical history, family history, and social history.             Objective   Problem List:  Patient Active Problem List   Diagnosis   ??? 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 Hepatic encephalopathy (CMS-HCC)   ??? 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 Obesity (BMI 30-39.9)   ??? Type 2 diabetes mellitus without complication, without long-term current use of insulin (CMS-HCC)     Medications: Reviewed and updated in Epic    Vital Signs:  Vitals:    12/09/20 0832   BP: 124/77  Pulse: 59   Temp: 36.5 ??C   SpO2: 100%   Weight: 94.3 kg (207 lb 12.8 oz)      Body mass index is 32.55 kg/m??.    Physical Exam:   Gen: Fatigued-appearing male, NAD  CV: RRR, no murmurs  Pulm: CTA bilaterally, no crackles or wheezes  Abd: Soft, NTND, normal BS. No appreciable ascites.   Ext: No edema in the bilateral lower extremities  Neuro: No asterixis.     Labs:  Results for orders placed or performed in visit on 12/09/20   Vitamin D 25 Hydroxy (25OH D2 + D3)   Result Value Ref Range    Vitamin D Total (25OH) 24.6 20.0 - 80.0 ng/mL   PT-INR   Result Value Ref Range    PT 15.0 (H) 10.3 - 13.4 sec    INR 1.29    Comprehensive metabolic panel   Result Value Ref Range    Sodium 137 135 - 145 mmol/L    Potassium 4.4 3.4 - 4.5 mmol/L    Chloride 109 (H) 98 - 107 mmol/L    Anion Gap 4 (L) 5 - 14 mmol/L    CO2 23.9 20.0 - 31.0 mmol/L    BUN 17 9 - 23 mg/dL    Creatinine 1.61 0.96 - 1.10 mg/dL    BUN/Creatinine Ratio 25     EGFR CKD-EPI Non-African American, Male >90 >=60 mL/min/1.64m2    EGFR CKD-EPI African American, Male >90 >=60 mL/min/1.7m2    Glucose 115 70 - 179 mg/dL    Calcium 9.6 8.7 - 04.5 mg/dL    Albumin 2.8 (L) 3.4 - 5.0 g/dL    Total Protein 7.3 5.7 - 8.2 g/dL    Total Bilirubin 4.4 (H) 0.3 - 1.2 mg/dL    AST 409 (H) <=81 U/L    ALT 178 (H) 10 - 49 U/L    Alkaline Phosphatase 239 (H) 46 - 116 U/L   Anti-Nuclear Antibody (ANA)   Result Value Ref Range    Antinuclear Antibodies (ANA) Positive (A) Negative    ANA Pattern 1 Speckled     ANA Titer 1 1:80    IgG   Result Value Ref Range    Total IgG 2,300 (H) 646-2,013 mg/dL   Type and Screen   Result Value Ref Range    Check Type Need Type Check     ABO Grouping A POS     Antibody Screen NEG    CBC w/ Differential   Result Value Ref Range    WBC 4.6 3.5 - 10.5 10*9/L    RBC 4.52 4.32 - 5.72 10*12/L    HGB 15.5 13.5 - 17.5 g/dL    HCT 19.1 47.8 - 29.5 %    MCV 99.6 (H) 81.0 - 95.0 fL    MCH 34.2 (H) 26.0 - 34.0 pg    MCHC 34.4 30.0 - 36.0 g/dL    RDW 62.1 (H) 30.8 - 15.0 %    MPV 11.3 (H) 7.0 - 10.0 fL    Platelet 125 (L) 150 - 450 10*9/L    Neutrophils % 61.9 %    Lymphocytes % 25.2 %    Monocytes % 10.4 %    Eosinophils % 1.9 %    Basophils % 0.6 %    Absolute Neutrophils 2.8 1.7 - 7.7 10*9/L    Absolute Lymphocytes 1.2 0.7 - 4.0 10*9/L    Absolute Monocytes 0.5 0.1 - 1.0 10*9/L    Absolute Eosinophils 0.1 0.0 - 0.7  10*9/L    Absolute Basophils 0.0 0.0 - 0.1 10*9/L     MELD-Na score: 15 at 12/09/2020  9:50 AM  MELD score: 15 at 12/09/2020  9:50 AM  Calculated from:  Serum Creatinine: 0.69 mg/dL (Using min of 1 mg/dL) at 03/11/2129  8:65 AM  Serum Sodium: 137 mmol/L at 12/09/2020  9:50 AM  Total Bilirubin: 4.4 mg/dL at 05/14/4695  2:95 AM  INR(ratio): 1.29 at 12/09/2020  9:50 AM  Age: 42 years

## 2021-01-20 MED ORDER — OMEPRAZOLE 20 MG CAPSULE,DELAYED RELEASE
0 days
Start: 2021-01-20 — End: ?

## 2021-01-28 ENCOUNTER — Encounter: Admit: 2021-01-28 | Discharge: 2021-01-29 | Payer: PRIVATE HEALTH INSURANCE

## 2021-01-28 DIAGNOSIS — K7581 Nonalcoholic steatohepatitis (NASH): Secondary | ICD-10-CM | POA: Diagnosis not present

## 2021-01-28 DIAGNOSIS — K7460 Unspecified cirrhosis of liver: Secondary | ICD-10-CM | POA: Diagnosis not present

## 2021-01-28 MED ADMIN — gadobenate dimeglumine (MULTIHANCE) 529 mg/mL (0.1mmol/0.2mL) solution 10 mL: 10 mL | INTRAVENOUS | @ 18:00:00 | Stop: 2021-01-28

## 2021-02-08 ENCOUNTER — Telehealth: Payer: Self-pay | Admitting: Gastroenterology

## 2021-02-08 DIAGNOSIS — N471 Phimosis: Secondary | ICD-10-CM | POA: Diagnosis not present

## 2021-02-08 NOTE — Telephone Encounter (Signed)
Pt called stating that his Hep shots that he had with [place] are not recorded on the [employer] immunization records ([facility]) website. Pt is on a transplant list and they are requesting these records to be added to the [facility].

## 2021-02-08 NOTE — Telephone Encounter (Signed)
The pt and his wife have asked that we enter immunizations on the [place] immunization website.  I have advised them that we do not have access to that website.  I offered to fax that information to whomever is requesting.  She asked that I send it to the pt's My Chart and she herself would enter the data.  I have sent a copy to My Chart.

## 2021-02-19 MED ORDER — OMEPRAZOLE 20 MG CAPSULE,DELAYED RELEASE
ORAL_CAPSULE | Freq: Every day | ORAL | 5 refills | 0 days
Start: 2021-02-19 — End: ?

## 2021-02-21 NOTE — Unmapped (Signed)
Patient has requested a medication refill via EPIC

## 2021-02-22 DIAGNOSIS — Z1159 Encounter for screening for other viral diseases: Secondary | ICD-10-CM | POA: Diagnosis not present

## 2021-02-24 NOTE — Unmapped (Signed)
[facility] Specialty Medication Onboarding    Specialty Medication: Xifaxan  Prior Authorization: Not Required   Financial Assistance: Yes - copay card approved as secondary   Final Copay/Day Supply: $701.89 / 90    Insurance Restrictions: None     Notes to Pharmacist: None    The triage team has completed the benefits investigation and has determined that the patient is able to fill this medication at [facility]. Please contact the patient to complete the onboarding or follow up with the prescribing physician as needed.

## 2021-03-02 NOTE — Unmapped (Signed)
[facility]   Patient Onboarding/Medication Counseling    [name] is a 40 y.o. male with hepatic encephalopathy who I am counseling today on initiation of therapy.  I am speaking to the patient.    Was a Nurse, learning disability used for this call? No    Verified patient's date of birth / HIPAA.    Specialty medication(s) to be sent: Infectious Disease: Xifaxan      Non-specialty medications/supplies to be sent: n/a      Medications not needed at this time: n/a         Xifaxan (rifaximin) 550mg  tablets    Medication & Administration     Dosage: Hepatic Encephalopathy Prevention: Take one 550mg  tablet by mouth twice daily.    Administration: Take with or without food.    Adherence/Missed dose instructions:   ??? Take missed dose as soon as you remember. If it is close to the time of your next dose, skip the missed dose and resume with your next scheduled dose.  ??? Do not take extra doses or 2 doses at the same time.    Goals of Therapy     Hepatic Encephalopathy: The goal is to reduce risk of overt hepatic encephalopathy recurrence.    Side Effects & Monitoring Parameters     Common Side Effects:   ??? Peripheral edema  ??? Nausea  ??? Dizziness  ??? Fatigue  ??? Ascites    The following side effects should be reported to the provider:  ?? Signs of an allergic reaction, such as rash; hives; itching; red, swollen, blistered, or peeling skin with or without fever. If you have wheezing; tightness in the chest or throat; trouble breathing, swallowing, or talking; unusual hoarseness; or swelling of the mouth, face, lips, tongue, or throat, call 911 or go to the closest emergency department (ED).   ?? Swelling in the arms, legs or stomach.   ?? Feeling very tired or weak.  ?? Low mood (depression).   ?? Fever.   ?? Diarrhea is common with antibiotics. Rarely, a severe form called C diff???associated diarrhea (CDAD) may happen. Sometimes this has led to a deadly bowel problem (colitis). CDAD may happen during or a few months after taking antibiotics. Call your doctor right away if you have stomach pain, cramps, or very loose, watery, or bloody stools. Check with your doctor before treating diarrhea.    Monitoring Parameters:   ??? For the prevention of hepatic encephalopathy:  Patient should monitor for changes in mental status.       Contraindications, Warnings, & Precautions     ??? Superinfection: Prolonged use may result in fungal or bacterial superinfection, including Clostridioides (formerly Clostridium) difficile-associated diarrhea (CDAD) and pseudomembranous colitis; CDAD has been observed >2 months post-antibiotic treatment.  ??? Severe (Child Pugh Class C) Hepatic Impairment: increased systemic exposure with severe hepatic impairment.  ??? Concomitant use with P-glycoprotein (P-gp) inhibitors: P-gp inhibitors may increase systemic exposure of rifaximin.    Drug/Food Interactions     ??? Medication list reviewed in Epic. The patient was instructed to inform the care team before taking any new medications or supplements. No drug interactions identified.   ??? Warfarin: monitor INR and prothrombin time; Dose adjustment of warfarin may be needed to maintain target INR range.    Storage, Handling Precautions, & Disposal     ??? Store this medication at room temperature.  ??? Store in a dry place. Do not store in a bathroom.   ??? Keep all drugs  out of the reach of children and pets.  ??? Throw away unused or expired drugs. Do not flush down a toilet or pour down a drain unless you are told to do so. Check with your pharmacist if you have questions about the best way to throw out drugs. There may be drug take-back programs in your area.        Current Medications (including OTC/herbals), Comorbidities and Allergies     Current Outpatient Medications   Medication Sig Dispense Refill   ??? metFORMIN (GLUCOPHAGE-XR) 500 MG 24 hr tablet Take 500 mg by mouth daily with evening meal.      ??? baclofen (LIORESAL) 10 MG tablet Take 2 tablets (20 mg total) by mouth nightly. 60 tablet 5   ??? furosemide (LASIX) 20 MG tablet Take 1 tablet (20 mg total) by mouth daily. 90 tablet 3   ??? lactulose (CHRONULAC) 10 gram/15 mL solution Take 30 mL (20 g total) by mouth Three (3) times a day. 2700 mL 5   ??? multivitamin/iron/folic acid (CENTRUM ORAL)      ??? omeprazole (PRILOSEC) 20 MG capsule Take 1 capsule (20 mg total) by mouth every morning before breakfast.     ??? rifAXIMin (XIFAXAN) 550 mg Tab Take 1 tablet (550 mg total) by mouth Two (2) times a day. 180 tablet 3   ??? spironolactone (ALDACTONE) 50 MG tablet Take 1 tablet (50 mg total) by mouth daily. 90 tablet 3     No current facility-administered medications for this visit.       No Known Allergies    Patient Active Problem List   Diagnosis   ??? 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 Hepatic encephalopathy (CMS-HCC)   ??? 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 Obesity (BMI 30-39.9)   ??? Type 2 diabetes mellitus without complication, without long-term current use of insulin (CMS-HCC)       Reviewed and up to date in Epic.    Appropriateness of Therapy     Acute infections noted within Epic:  No active infections  Patient reported infection: None    Is medication and dose appropriate based on diagnosis and infection status? Yes    Prescription has been clinically reviewed: Yes      Baseline Quality of Life Assessment      How many days over the past month did your hepatic encephalopathy  keep you from your normal activities? For example, brushing your teeth or getting up in the morning. 0    Financial Information     Medication Assistance provided: Copay Assistance    Anticipated copay of $701.89 for 90 day supply was reviewed with patient.    Verified delivery address.     **I told them about the option to pay a minimum payment of $10.00 per month with no interest or time limit**    Delivery Information     Scheduled delivery date: 03/03/21    Expected start date: 03/03/21    Medication will be delivered via UPS to the prescription address in [facility]  This shipment will not require a signature.      Explained the services we provide at [facility] and that each month we would call to set up refills.  Stressed importance of returning phone calls so that we could ensure they receive their medications in time each month.  Informed patient that we should be setting up refills 7-10 days prior to when they will run  out of medication.  A pharmacist will reach out to perform a clinical assessment periodically.  Informed patient that a welcome packet, containing information about our pharmacy and other support services, a Notice of Privacy Practices, and a drug information handout will be sent.      Patient verbalized understanding of the above information as well as how to contact the pharmacy at [phone] option 4 with any questions/concerns.  The pharmacy is open Monday through Friday 8:30am-4:30pm.  A pharmacist is available 24/7 via pager to answer any clinical questions they may have.    Patient Specific Needs     - Does the patient have any physical, cognitive, or cultural barriers? No    - Patient prefers to have medications discussed with  either the patient or his [name]     - Is the patient or caregiver able to read and understand education materials at a high school level or above? Yes    - Patient's primary language is  English     - Is the patient high risk? No    - Does the patient require a Care Management Plan? No     - Does the patient require physician intervention or other additional services (i.e. nutrition, smoking cessation, social work)? No      Roderic Palau  [facility] Specialty Pharmacist

## 2021-03-07 ENCOUNTER — Encounter
Admit: 2021-03-07 | Discharge: 2021-03-08 | Payer: PRIVATE HEALTH INSURANCE | Attending: Gastroenterology | Primary: Gastroenterology

## 2021-03-07 DIAGNOSIS — K7581 Nonalcoholic steatohepatitis (NASH): Principal | ICD-10-CM

## 2021-03-07 DIAGNOSIS — K7460 Unspecified cirrhosis of liver: Principal | ICD-10-CM

## 2021-03-07 DIAGNOSIS — Z6832 Body mass index (BMI) 32.0-32.9, adult: Secondary | ICD-10-CM | POA: Diagnosis not present

## 2021-03-07 LAB — COMPREHENSIVE METABOLIC PANEL
ALBUMIN: 2.7 g/dL — ABNORMAL LOW (ref 3.4–5.0)
ALKALINE PHOSPHATASE: 224 U/L — ABNORMAL HIGH (ref 46–116)
ALT (SGPT): 126 U/L — ABNORMAL HIGH (ref 10–49)
ANION GAP: 4 mmol/L — ABNORMAL LOW (ref 5–14)
BILIRUBIN TOTAL: 4.1 mg/dL — ABNORMAL HIGH (ref 0.3–1.2)
BLOOD UREA NITROGEN: 16 mg/dL (ref 9–23)
BUN / CREAT RATIO: 21
CALCIUM: 8.9 mg/dL (ref 8.7–10.4)
CHLORIDE: 108 mmol/L — ABNORMAL HIGH (ref 98–107)
CO2: 24.1 mmol/L (ref 20.0–31.0)
CREATININE: 0.76 mg/dL
EGFR CKD-EPI AA MALE: >90 mL/min/{1.73_m2} (ref >=60)
EGFR CKD-EPI NON-AA MALE: >90 mL/min/{1.73_m2} (ref >=60)
GLUCOSE RANDOM: 111 mg/dL (ref 70–179)
PROTEIN TOTAL: 6.8 g/dL (ref 5.7–8.2)
SODIUM: 136 mmol/L (ref 135–145)

## 2021-03-07 LAB — IGG: GAMMAGLOBULIN; IGG: 2054 mg/dL — ABNORMAL HIGH (ref 646–2013)

## 2021-03-07 LAB — CBC W/ AUTO DIFF
BASOPHILS ABSOLUTE COUNT: 0 10*9/L (ref 0.0–0.1)
BASOPHILS RELATIVE PERCENT: 0.8 %
EOSINOPHILS ABSOLUTE COUNT: 0.1 10*9/L (ref 0.0–0.5)
EOSINOPHILS RELATIVE PERCENT: 2.7 %
HEMATOCRIT: 39.9 % (ref 39.0–48.0)
HEMOGLOBIN: 14.2 g/dL (ref 12.9–16.5)
LYMPHOCYTES ABSOLUTE COUNT: 0.9 10*9/L — ABNORMAL LOW (ref 1.1–3.6)
LYMPHOCYTES RELATIVE PERCENT: 21.3 %
MEAN CORPUSCULAR HEMOGLOBIN CONC: 35.7 g/dL (ref 32.0–36.0)
MEAN CORPUSCULAR HEMOGLOBIN: 34.2 pg — ABNORMAL HIGH (ref 25.9–32.4)
MEAN CORPUSCULAR VOLUME: 95.9 fL — ABNORMAL HIGH (ref 77.6–95.7)
MEAN PLATELET VOLUME: 11.4 fL — ABNORMAL HIGH (ref 6.8–10.7)
MONOCYTES ABSOLUTE COUNT: 0.5 10*9/L (ref 0.3–0.8)
MONOCYTES RELATIVE PERCENT: 10.6 %
NEUTROPHILS ABSOLUTE COUNT: 2.9 10*9/L (ref 1.8–7.8)
NEUTROPHILS RELATIVE PERCENT: 64.6 %
PLATELET COUNT: 107 10*9/L — ABNORMAL LOW (ref 150–450)
RED BLOOD CELL COUNT: 4.16 10*12/L — ABNORMAL LOW (ref 4.26–5.60)
RED CELL DISTRIBUTION WIDTH: 15.1 % (ref 12.2–15.2)
WBC ADJUSTED: 4.5 10*9/L (ref 3.6–11.2)

## 2021-03-07 LAB — BILIRUBIN, DIRECT: BILIRUBIN DIRECT: 2.8 mg/dL — ABNORMAL HIGH (ref 0.00–0.30)

## 2021-03-07 LAB — AFP TUMOR MARKER: AFP-TUMOR MARKER: 4 ng/mL (ref <=8)

## 2021-03-07 LAB — PROTIME-INR
INR: 1.33
PROTIME: 15.5 s — ABNORMAL HIGH (ref 10.3–13.4)

## 2021-03-07 NOTE — Unmapped (Signed)
[facility] Visit    Reason for Visit: Follow-up of Cirrhosis  Primary Care Provider: Gaspar Garbe, MD    Assessment & Plan: Derek Boyer is a 40 y.o. male with a PMHx of T2DM, obesity, HTN and NASH cirrhosis c/b non-bleeding G1EVs, HE, and ascites that presents for follow-up of NASH cirrhosis.    NASH Cirrhosis: Complicated by non-bleeding Grade I EVs, hepatic encephalopathy, and ascites/BLE edema. Today, he appears euvolemic on exam and reports good control of his volume status with his low-dose diuretic regimen. Major issue from a liver perspective today is hepatic encephalopathy reported by both the patient and his wife. He is relatively clear today and without asterixis, but he and his wife both report symptoms tend to be worse in the afternoon. He is at goal with bowel movements on lactulose and as such we will add rifaximin today. Labs will be updated and will order MRI for May to monitor LR-3 lesion seen on prior exam.   -- Update MELD labs today--> MELD Na 16  -- Furosemide 20mg  daily  -- Spironolactone 50mg  daily  -- Lactulose titrated to 3-5 BMs daily  -- Just began taking Xifaxan 550mg  BID  -- Continue strength-training exercises and protein supplements  -- MELD was 15 at [facility]--> good transplant candidate for internal approval; Blood grp A    Health Maintenance in Cirrhosis:   -- HCC Screening: Due - MRI Ordered for Sept 2022  -- Varices Screening: UTD - Next Due 09/2022 (G1EVs on last scope)  -- Colon Cancer Screening: Start at age 39  -- Osteoporosis Screening: Vit D low normal (should start D3 2000u)    Vaccines in Cirrhosis:   -- Hepatitis A: Immune  -- Hepatitis B: Immune  -- Pneumococcal: PCV-13 ordered today, s/p PPSV-23 (08/2019)  -- Shingles: Due at age 45  -- COVID-19: S/p primary series + booster     Gerritt Galentine M. Piedad Climes, MD  [facility]       Return in about 3 months (around 06/07/2021).       Subjective   HPI: Derek Boyer is a 40 y.o. male with a PMHx of T2DM, obesity, HTN and NASH cirrhosis c/b non-bleeding G1EVs, HE, and ascites that is seen in follow-up for NASH cirrhosis.    Prior History: He has family history of NAFLD. He has never had significant ETOH intake and none since he was told her had liver disease. His aunt and brother have fatty liver. No other family history of liver disease or GI cancers. His peak weight was 260. Per outside records, 4/21 labs w/ Tbili 4.1, AST 131, ALT 78, alk phos 153. He was seen by Dr. Christella Hartigan for liver, but wanted second opinion so referred to [facility]. Cirrhosis incidentally noted during lap chole (liver biopsy path showed early cirrhosis with no clear etiology). 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 Presumed NAFLD. W/u with negative hepatitis studies, normal iron panel and ferritin, ceruloplasmin 39, A1AT 231, ASMA neg, AMA neg, ANA neg. Started hep A/B immunization 2019, which are complete. EGD 2019 w/o varices, due 08/2021 for repeat EGD. 04/2019 US showed new masslike area and f/u MRI 05/2019 showed 9mm lesion in segment 8 with MRI 11/2019 showing that lesion 1.2cm.   ??  At  last clinic visit 03/29/20, planned for labs and repeat MRI. The over read at [facility] from outside MRI noted Hypoenhancing 1.0 cm hepatic segment 8 lesion as above, unchanged dating back to 05/12/2019 and without other T2/DWI signal abnormality. 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 5/24 labs showed Tbili 3.7, direct bili 2.4, AST 152, ALT 88, Alk phos 171, AFP 4, INR 1.21, albumin 3.1, plts 115. Per telephone notes, discussion at [facility] conference was that a segment 8 lesion was thought to be regenerative nodule so recommended f/u MRI in 6 months. Due 10/2020. Per phone note, patient called in to say that he was retaining fluid. He had also been having nausea and was started on PPI, which helped some. Only occasional nausea.   ??  Interim History: Derek Boyer presents today to clinic accompanied by his wife who contributes to the history. Overall, he is doing okay. No ED or hospital visits. He is taking lactulose and having 4 or more BM's daily. Just got Xifaxan through specialty pharmacy. Thinking is about the same. Wife says he is slow to respond. No traffic accidents. No significant ascites but does notice LE edema in legs by end of day. No GI bleeding. Scheduled for circumcision on 03/18/21. Patient denies any jaundice, pruritis, SOB, CP, increased abdominal girth, LE edema, melena, BRBPR, confusion, depression, nausea, vomiting, or diarrhea.    The following items in the patient's electronic medical record were reviewed and updated as needed: past medical/surgical history, family history, and social history.               Objective   Problem List:  Patient Active Problem List   Diagnosis   ??? 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 Hepatic encephalopathy (CMS-HCC)   ??? 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 Obesity (BMI 30-39.9)   ??? Type 2 diabetes mellitus without complication, without long-term current use of insulin (CMS-HCC)     Medications: Reviewed and updated in Epic    Vital Signs:  Vitals:    03/07/21 0830   BP: 120/69   Pulse: 55   Temp: 36.6 ??C (97.9 ??F)   TempSrc: Temporal   SpO2: 98%   Weight: 94.8 kg (209 lb)      Body mass index is 32.73 kg/m??.    Physical Exam:   Gen: Fatigued-appearing male, NAD  CV: RRR, no murmurs  Pulm: CTA bilaterally, no crackles or wheezes  Abd: Soft, NTND, normal BS. No appreciable ascites.   Ext: No edema in the bilateral lower extremities  Neuro: No asterixis.     Labs:  Results for orders placed or performed in visit on 03/07/21   AFP tumor marker   Result Value Ref Range    AFP-Tumor Marker 4 <=8 ng/mL   PT-INR   Result Value Ref Range    PT 15.5 (H) 10.3 - 13.4 sec    INR 1.33    Bilirubin, Direct   Result Value Ref Range    Bilirubin, Direct 2.80 (H) 0.00 - 0.30 mg/dL   Comprehensive metabolic panel   Result Value Ref Range    Sodium 136 135 - 145 mmol/L    Potassium      Chloride 108 (H) 98 - 107 mmol/L    Anion Gap 4 (L) 5 - 14 mmol/L    CO2 24.1 20.0 - 31.0 mmol/L    BUN 16 9 - 23 mg/dL    Creatinine 1.91 4.78 - 1.10 mg/dL  BUN/Creatinine Ratio 21     EGFR CKD-EPI Non-African American, Male >90 >=60 mL/min/1.61m2    EGFR CKD-EPI African American, Male >90 >=60 mL/min/1.63m2    Glucose 111 70 - 179 mg/dL    Calcium 8.9 8.7 - 16.1 mg/dL    Albumin 2.7 (L) 3.4 - 5.0 g/dL    Total Protein 6.8 5.7 - 8.2 g/dL    Total Bilirubin 4.1 (H) 0.3 - 1.2 mg/dL    AST      ALT 096 (H) 10 - 49 U/L    Alkaline Phosphatase 224 (H) 46 - 116 U/L   IgG   Result Value Ref Range    Total IgG 2,054 (H) 646-2,013 mg/dL   Anti-Smooth Muscle Antibody, IgG   Result Value Ref Range    Smooth Muscle Ab Negative Negative   CBC w/ Differential   Result Value Ref Range    WBC 4.5 3.6 - 11.2 10*9/L    RBC 4.16 (L) 4.26 - 5.60 10*12/L    HGB 14.2 12.9 - 16.5 g/dL    HCT 04.5 40.9 - 81.1 %    MCV 95.9 (H) 77.6 - 95.7 fL    MCH 34.2 (H) 25.9 - 32.4 pg    MCHC 35.7 32.0 - 36.0 g/dL    RDW 91.4 78.2 - 95.6 %    MPV 11.4 (H) 6.8 - 10.7 fL    Platelet 107 (L) 150 - 450 10*9/L    Neutrophils % 64.6 %    Lymphocytes % 21.3 %    Monocytes % 10.6 %    Eosinophils % 2.7 %    Basophils % 0.8 %    Absolute Neutrophils 2.9 1.8 - 7.8 10*9/L    Absolute Lymphocytes 0.9 (L) 1.1 - 3.6 10*9/L    Absolute Monocytes 0.5 0.3 - 0.8 10*9/L    Absolute Eosinophils 0.1 0.0 - 0.5 10*9/L    Absolute Basophils 0.0 0.0 - 0.1 10*9/L     MELD-Na score: 16 at 03/07/2021  9:55 AM  MELD score: 15 at 03/07/2021  9:55 AM  Calculated from:  Serum Creatinine: 0.76 mg/dL (Using min of 1 mg/dL) at 12/07/3084  5:78 AM  Serum Sodium: 136 mmol/L at 03/07/2021  9:55 AM  Total Bilirubin: 4.1 mg/dL at 02/10/9628  5:28 AM  INR(ratio): 1.33 at 03/07/2021  9:55 AM  Age: 27 years

## 2021-03-07 NOTE — Unmapped (Signed)
--   labs today  -- no change to current medications--> will see if Xifaxan helps with fuzzy thinking  -- If MELD score remains 15 or higher, will likely refer fo transplant evaluation  -- MRI due in Sept 2022  -- Follow-up in 3 months

## 2021-03-10 LAB — ANTI-SMOOTH MUSCLE ANTIBODY, IGG: SMOOTH MUSCLE ANTIBODY: NEGATIVE

## 2021-03-18 DIAGNOSIS — N475 Adhesions of prepuce and glans penis: Secondary | ICD-10-CM | POA: Diagnosis not present

## 2021-03-18 DIAGNOSIS — N471 Phimosis: Secondary | ICD-10-CM | POA: Diagnosis not present

## 2021-03-31 NOTE — Unmapped (Signed)
[facility] Clinical Assessment & Refill Coordination Note    [name], DOB: 1981/02/02  Phone: [phone] (home)     All above HIPAA information was verified with patient.     Was a Nurse, learning disability used for this call? No    Specialty Medication(s):   Infectious Disease: Xifaxan     Current Outpatient Medications   Medication Sig Dispense Refill   ??? baclofen (LIORESAL) 10 MG tablet Take 2 tablets (20 mg total) by mouth nightly. 60 tablet 5   ??? furosemide (LASIX) 20 MG tablet Take 1 tablet (20 mg total) by mouth daily. 90 tablet 3   ??? lactulose (CHRONULAC) 10 gram/15 mL solution Take 30 mL (20 g total) by mouth Three (3) times a day. 2700 mL 5   ??? metFORMIN (GLUCOPHAGE-XR) 500 MG 24 hr tablet Take 500 mg by mouth daily with evening meal.      ??? multivitamin/iron/folic acid (CENTRUM ORAL)      ??? omeprazole (PRILOSEC) 20 MG capsule Take 1 capsule (20 mg total) by mouth every morning before breakfast.     ??? rifAXIMin (XIFAXAN) 550 mg Tab Take 1 tablet (550 mg total) by mouth Two (2) times a day. 180 tablet 3   ??? spironolactone (ALDACTONE) 50 MG tablet Take 1 tablet (50 mg total) by mouth daily. 90 tablet 3     No current facility-administered medications for this visit.        Changes to medications: [name] reports no changes at this time.    No Known Allergies    Changes to allergies: No    SPECIALTY MEDICATION ADHERENCE     Xifaxan 550 mg: approximately 62 days of medicine on hand       Medication Adherence    Patient reported X missed doses in the last month: 0  Specialty Medication: Xifaxan 550mg   Patient is on additional specialty medications: No  Demonstrates understanding of importance of adherence: yes  Informant: patient  Provider-estimated medication adherence level: good  Patient is at risk for Non-Adherence: No          Specialty medication(s) dose(s) confirmed: Regimen is correct and unchanged.     Are there any concerns with adherence? No    Adherence counseling provided? Not needed    CLINICAL MANAGEMENT AND INTERVENTION      Clinical Benefit Assessment:    Do you feel the medicine is effective or helping your condition? Yes    Clinical Benefit counseling provided? Not needed    Adverse Effects Assessment:    Are you experiencing any side effects? No    Are you experiencing difficulty administering your medicine? No    Quality of Life Assessment:    How many days over the past month did your hepatic encephalopathy  keep you from your normal activities? For example, brushing your teeth or getting up in the morning. 0    Have you discussed this with your provider? Not needed    Acute Infection Status:    Acute infections noted within Epic:  No active infections  Patient reported infection: None    Therapy Appropriateness:    Is therapy appropriate? Yes, therapy is appropriate and should be continued    DISEASE/MEDICATION-SPECIFIC INFORMATION      N/A    PATIENT SPECIFIC NEEDS     - Does the patient have any physical, cognitive, or cultural barriers? No    - Is the patient high risk? No    - Does the patient  require a Care Management Plan? No     - Does the patient require physician intervention or other additional services (i.e. nutrition, smoking cessation, social work)? No      SHIPPING     Specialty Medication(s) to be Shipped:   Infectious Disease: Patient does not need a refill of Xifaxan at this time.    Other medication(s) to be shipped: No additional medications requested for fill at this time     Changes to insurance: No    Delivery Scheduled: no     The patient will receive a drug information handout for each medication shipped and additional FDA Medication Guides as required.  Verified that patient has previously received a Conservation officer, historic buildings and a Surveyor, mining.    The patient or caregiver noted above participated in the development of this care plan and knows that they can request review of or adjustments to the care plan at any time.      All of the patient's questions and concerns have been addressed.    Roderic Palau   [facility] Specialty Pharmacist

## 2021-04-01 DIAGNOSIS — N471 Phimosis: Secondary | ICD-10-CM | POA: Diagnosis not present

## 2021-04-22 DIAGNOSIS — K7581 Nonalcoholic steatohepatitis (NASH): Principal | ICD-10-CM

## 2021-04-22 DIAGNOSIS — K7460 Unspecified cirrhosis of liver: Principal | ICD-10-CM

## 2021-04-22 DIAGNOSIS — K7290 Hepatic failure, unspecified without coma: Principal | ICD-10-CM

## 2021-04-22 MED ORDER — LACTULOSE 10 GRAM/15 ML ORAL SOLUTION
Freq: Three times a day (TID) | ORAL | 5 refills | 30.00000 days | Status: CP
Start: 2021-04-22 — End: 2021-10-19

## 2021-04-22 NOTE — Unmapped (Signed)
Received a call from the patient's new pharmacy for a refill for lactulose.   Refill sent to new pharmacy

## 2021-05-10 DIAGNOSIS — K7581 Nonalcoholic steatohepatitis (NASH): Principal | ICD-10-CM

## 2021-05-10 DIAGNOSIS — K7460 Unspecified cirrhosis of liver: Principal | ICD-10-CM

## 2021-05-10 MED ORDER — OMEPRAZOLE 20 MG CAPSULE,DELAYED RELEASE
ORAL_CAPSULE | 1 refills | 0 days | Status: CP
Start: 2021-05-10 — End: ?

## 2021-05-10 MED ORDER — BACLOFEN 10 MG TABLET
ORAL_TABLET | 5 refills | 0 days | Status: CP
Start: 2021-05-10 — End: ?

## 2021-05-10 NOTE — Unmapped (Signed)
Refill request for Omeprazole and Baclofen     LCV 03/07/21 with Dr. Piedad Climes     No changes to these medications     Will authorize refills at this time    Lanora Manis, RN

## 2021-05-18 DIAGNOSIS — E1165 Type 2 diabetes mellitus with hyperglycemia: Secondary | ICD-10-CM | POA: Diagnosis not present

## 2021-05-20 NOTE — Unmapped (Signed)
[facility] Refill Coordination Note    Specialty Medication(s) to be Shipped:   Infectious Disease: Xifaxan    Other medication(s) to be shipped: No additional medications requested for fill at this time     Derek Boyer, DOB: 25-Jan-1981  Phone: [phone] (home)       All above HIPAA information was verified with patient.     Was a Nurse, learning disability used for this call? No    Completed refill call assessment today to schedule patient's medication shipment from the [facility] [phone]).  All relevant notes have been reviewed.     Specialty medication(s) and dose(s) confirmed: Regimen is correct and unchanged.   Changes to medications: [name] reports no changes at this time.  Changes to insurance: No  New side effects reported not previously addressed with a pharmacist or physician: None reported  Questions for the pharmacist: No    Confirmed patient received a Conservation officer, historic buildings and a Surveyor, mining with first shipment. The patient will receive a drug information handout for each medication shipped and additional FDA Medication Guides as required.       DISEASE/MEDICATION-SPECIFIC INFORMATION        N/A    SPECIALTY MEDICATION ADHERENCE     Medication Adherence    Patient reported X missed doses in the last month: 0  Specialty Medication: XIFAXAN 550 mg  Patient is on additional specialty medications: No  Patient is on more than two specialty medications: No  Any gaps in refill history greater than 2 weeks in the last 3 months: no  Demonstrates understanding of importance of adherence: yes  Informant: patient  Reliability of informant: reliable  Provider-estimated medication adherence level: good  Patient is at risk for Non-Adherence: No              Were doses missed due to medication being on hold? No    xifaxan 550 mg: 11 days of medicine on hand         REFERRAL TO PHARMACIST     Referral to the pharmacist: Not needed      [facility]     Shipping address confirmed in Epic. Delivery Scheduled: Yes, Expected medication delivery date: 07/21.     Medication will be delivered via UPS to the prescription address in Epic [facility].    Antonietta Barcelona   [facility] Specialty Technician

## 2021-05-25 MED FILL — XIFAXAN 550 MG TABLET: ORAL | 30 days supply | Qty: 60 | Fill #1

## 2021-06-06 ENCOUNTER — Encounter
Admit: 2021-06-06 | Discharge: 2021-06-07 | Payer: PRIVATE HEALTH INSURANCE | Attending: Gastroenterology | Primary: Gastroenterology

## 2021-06-06 DIAGNOSIS — K7581 Nonalcoholic steatohepatitis (NASH): Principal | ICD-10-CM

## 2021-06-06 DIAGNOSIS — K7290 Hepatic failure, unspecified without coma: Principal | ICD-10-CM

## 2021-06-06 DIAGNOSIS — K769 Liver disease, unspecified: Principal | ICD-10-CM

## 2021-06-06 DIAGNOSIS — K7460 Unspecified cirrhosis of liver: Principal | ICD-10-CM

## 2021-06-06 LAB — CBC
HEMATOCRIT: 41.3 % (ref 39.0–48.0)
HEMOGLOBIN: 14.6 g/dL (ref 12.9–16.5)
MEAN CORPUSCULAR HEMOGLOBIN CONC: 35.4 g/dL (ref 32.0–36.0)
MEAN CORPUSCULAR HEMOGLOBIN: 33.8 pg — ABNORMAL HIGH (ref 25.9–32.4)
MEAN CORPUSCULAR VOLUME: 95.5 fL (ref 77.6–95.7)
MEAN PLATELET VOLUME: 11.2 fL — ABNORMAL HIGH (ref 6.8–10.7)
PLATELET COUNT: 101 10*9/L — ABNORMAL LOW (ref 150–450)
RED BLOOD CELL COUNT: 4.32 10*12/L (ref 4.26–5.60)
RED CELL DISTRIBUTION WIDTH: 14.9 % (ref 12.2–15.2)
WBC ADJUSTED: 4.6 10*9/L (ref 3.6–11.2)

## 2021-06-06 LAB — COMPREHENSIVE METABOLIC PANEL
ALBUMIN: 2.9 g/dL — ABNORMAL LOW (ref 3.4–5.0)
ALKALINE PHOSPHATASE: 247 U/L — ABNORMAL HIGH (ref 46–116)
ALT (SGPT): 97 U/L — ABNORMAL HIGH (ref 10–49)
ANION GAP: 5 mmol/L (ref 5–14)
AST (SGOT): 196 U/L — ABNORMAL HIGH (ref <=34)
BILIRUBIN TOTAL: 4.1 mg/dL — ABNORMAL HIGH (ref 0.3–1.2)
BLOOD UREA NITROGEN: 14 mg/dL (ref 9–23)
BUN / CREAT RATIO: 20
CALCIUM: 8.9 mg/dL (ref 8.7–10.4)
CHLORIDE: 107 mmol/L (ref 98–107)
CO2: 24.7 mmol/L (ref 20.0–31.0)
CREATININE: 0.71 mg/dL
EGFR CKD-EPI (2021) MALE: >90 mL/min/{1.73_m2} (ref >=60)
GLUCOSE RANDOM: 105 mg/dL (ref 70–179)
POTASSIUM: 4 mmol/L (ref 3.4–4.8)
PROTEIN TOTAL: 7.2 g/dL (ref 5.7–8.2)
SODIUM: 137 mmol/L (ref 135–145)

## 2021-06-06 LAB — BILIRUBIN, DIRECT: BILIRUBIN DIRECT: 2.8 mg/dL — ABNORMAL HIGH (ref 0.00–0.30)

## 2021-06-06 LAB — PROTIME-INR
INR: 1.29
PROTIME: 15.1 s — ABNORMAL HIGH (ref 10.3–13.4)

## 2021-06-06 NOTE — Unmapped (Addendum)
--   labs today MELD score  -- no change to current meds  -- referral for liver transplant evaluation for internal approval (You will hear from Corrie my transplant coordinator)  -- will obtain MRI (liver cancer screen) as part of your transplant evaluation  -- follow-up in 4 months

## 2021-06-06 NOTE — Unmapped (Signed)
[facility] at [facility] Visit    Reason for Visit: Follow-up of Cirrhosis  Primary Care Provider: Gaspar Garbe, MD    Assessment & Plan: Derek Boyer is a 40 y.o. male with a PMHx of T2DM, obesity, HTN and NASH cirrhosis c/b non-bleeding G1EVs, HE, and ascites that presents for follow-up of NASH cirrhosis.    NASH Cirrhosis: Complicated by non-bleeding Grade I EVs, hepatic encephalopathy, and ascites/BLE edema. Today, he appears euvolemic on exam and reports good control of his volume status with his low-dose diuretic regimen. Major issue from a liver perspective today is hepatic encephalopathy reported by both the patient and his wife. He is relatively clear today and without asterixis, but he and his wife both report symptoms tend to be worse in the afternoon. He is at goal with bowel movements on lactulose and as such we will add rifaximin today. Labs will be updated and will order MRI for May to monitor LR-3 lesion seen on prior exam.   -- labs today MELD score  -- no change to current meds  -- referral for liver transplant evaluation for internal approval  -- will obtain MRI (liver cancer screen) as part of transplant evaluation  -- Continue strength-training exercises and protein supplements  -- MELD was 16 at [facility]--> good transplant candidate for internal approval; Blood grp A  -- follow-up in 4 months    Health Maintenance in Cirrhosis:   -- HCC Screening: Due - MRI Ordered for Sept 2022  -- Varices Screening: UTD - Next Due 09/2022 (G1EVs on last scope)  -- Colon Cancer Screening: Start at age 50  -- Osteoporosis Screening: Vit D low normal (should start D3 2000u)    Vaccines in Cirrhosis:   -- Hepatitis A: Immune  -- Hepatitis B: Immune  -- Pneumococcal: PCV-13 ordered today, s/p PPSV-23 (08/2019)  -- Shingles: Due at age 49  -- COVID-19: S/p primary series + booster     Shenicka Sunderlin M. Piedad Climes, MD  [facility]     Return in about 4 months (around 10/06/2021).       Subjective   HPI: Derek Boyer is a 40 y.o. male with a PMHx of T2DM, obesity, HTN and NASH cirrhosis c/b non-bleeding G1EVs, HE, and ascites that is seen in follow-up for NASH cirrhosis.    Prior History: He has family history of NAFLD. He has never had significant ETOH intake and none since he was told her had liver disease. His aunt and brother have fatty liver. No other family history of liver disease or GI cancers. His peak weight was 260. Per outside records, 4/21 labs w/ Tbili 4.1, AST 131, ALT 78, alk phos 153. He was seen by Dr. Christella Hartigan for liver, but wanted second opinion so referred to [facility]. Cirrhosis incidentally noted during lap chole (liver biopsy path showed early cirrhosis with no clear etiology). 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 Presumed NAFLD. W/u with negative hepatitis studies, normal iron panel and ferritin, ceruloplasmin 39, A1AT 231, ASMA neg, AMA neg, ANA neg. Started hep A/B immunization 2019, which are complete. EGD 2019 w/o varices, due 08/2021 for repeat EGD. 04/2019 US showed new masslike area and f/u MRI 05/2019 showed 9mm lesion in segment 8 with MRI 11/2019 showing that lesion 1.2cm.   ??  At last clinic visit 03/29/20, planned for labs and repeat MRI. The over read at [facility] from outside MRI noted Hypoenhancing 1.0 cm hepatic segment 8 lesion as above, unchanged dating back to 05/12/2019 and without other T2/DWI signal abnormality. 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 5/24 labs showed Tbili 3.7, direct bili 2.4, AST 152, ALT 88, Alk phos 171, AFP 4, INR 1.21, albumin 3.1, plts 115. Per telephone notes, discussion at [facility] conference was that a segment 8 lesion was thought to be regenerative nodule so recommended f/u MRI in 6 months. Due 10/2020. Per phone note, patient called in to say that he was retaining fluid. He had also been having nausea and was started on PPI, which helped some. Only occasional nausea.   ??  Interim History: [name] presents today to clinic alone for follow-up. Overall, he is doing okay. No ED or hospital visits.     No hospitalization or ED visits  Has noted LE edema with standing and wearing socks and shoes  Taking adequate protein and low sodium diet  Going to gym 2 days per week  Thinking has been hit or miss despite taking lactulose and Xifaxan  Struggling past 2 weeks--> decreased appetite and mild confusion--> wife can tell if he is off  If patients gets off routine, does not do well--> waxes and wanes--> went to beach and had rough time  HgA1c is 4.9  Severe fatigue in afternoon  Protein and working out has helped some with this    Patient denies any pruritis, SOB, CP, increased abdominal girth, melena, BRBPR, depression, nausea, vomiting, or diarrhea.    The following items in the patient's electronic medical record were reviewed and updated as needed: past medical/surgical history, family history, and social history.               Objective   Problem List:  Patient Active Problem List   Diagnosis   ??? 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 Hepatic encephalopathy (CMS-HCC)   ??? 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 Obesity (BMI 30-39.9)   ??? Type 2 diabetes mellitus without complication, without long-term current use of insulin (CMS-HCC)     Medications:     Current Outpatient Medications:   ???  baclofen (LIORESAL) 10 MG tablet, Take 2 tablets (20 mg total) by mouth nightly., Disp: 60 tablet, Rfl: 5  ???  furosemide (LASIX) 20 MG tablet, Take 1 tablet (20 mg total) by mouth daily., Disp: 90 tablet, Rfl: 3  ???  lactulose (CHRONULAC) 10 gram/15 mL solution, Take 30 mL (20 g total) by mouth Three (3) times a day., Disp: 2700 mL, Rfl: 5  ???  metFORMIN (GLUCOPHAGE-XR) 500 MG 24 hr tablet, Take 500 mg by mouth daily with evening meal. , Disp: , Rfl:   ???  multivitamin/iron/folic acid (CENTRUM ORAL), , Disp: , Rfl:   ???  omeprazole (PRILOSEC) 20 MG capsule, Take 1 capsule (20 mg total) by mouth every morning before breakfast., Disp: 90 capsule, Rfl: 1  ???  rifAXIMin (XIFAXAN) 550 mg Tab, Take 1 tablet (550 mg total) by mouth Two (2) times a day., Disp: 180 tablet, Rfl: 3  ???  spironolactone (ALDACTONE) 50 MG tablet, Take 1 tablet (50 mg total) by mouth daily., Disp: 90 tablet, Rfl: 3      Vital Signs:  Vitals:    06/06/21 0757   BP: 124/69   BP Site: L Arm   BP  Position: Sitting   BP Cuff Size: Medium   Pulse: 55   Temp: 37.3 ??C (99.1 ??F)   TempSrc: Temporal   SpO2: 99%   Weight: 96.2 kg (212 lb)      Body mass index is 33.2 kg/m??.    Physical Exam:   Gen: Fatigued-appearing male, NAD  CV: RRR, no murmurs  Pulm: CTA bilaterally, no crackles or wheezes  Abd: Soft, NTND, normal BS. No appreciable ascites.   Ext: No edema in the bilateral lower extremities  Neuro: No asterixis.     Labs:  Results for orders placed or performed in visit on 06/06/21   CBC   Result Value Ref Range    WBC 4.6 3.6 - 11.2 10*9/L    RBC 4.32 4.26 - 5.60 10*12/L    HGB 14.6 12.9 - 16.5 g/dL    HCT 16.1 09.6 - 04.5 %    MCV 95.5 77.6 - 95.7 fL    MCH 33.8 (H) 25.9 - 32.4 pg    MCHC 35.4 32.0 - 36.0 g/dL    RDW 40.9 81.1 - 91.4 %    MPV 11.2 (H) 6.8 - 10.7 fL    Platelet 101 (L) 150 - 450 10*9/L   Bilirubin, Direct   Result Value Ref Range    Bilirubin, Direct 2.80 (H) 0.00 - 0.30 mg/dL   PT-INR   Result Value Ref Range    PT 15.1 (H) 10.3 - 13.4 sec    INR 1.29    Comprehensive metabolic panel   Result Value Ref Range    Sodium 137 135 - 145 mmol/L    Potassium 4.0 3.4 - 4.8 mmol/L    Chloride 107 98 - 107 mmol/L    CO2 24.7 20.0 - 31.0 mmol/L    Anion Gap 5 5 - 14 mmol/L    BUN 14 9 - 23 mg/dL    Creatinine 7.82 9.56 - 1.10 mg/dL    BUN/Creatinine Ratio 20     eGFR CKD-EPI (2021) Male >90 >=60 mL/min/1.21m2    Glucose 105 70 - 179 mg/dL    Calcium 8.9 8.7 - 21.3 mg/dL    Albumin 2.9 (L) 3.4 - 5.0 g/dL    Total Protein 7.2 5.7 - 8.2 g/dL    Total Bilirubin 4.1 (H) 0.3 - 1.2 mg/dL    AST 086 (H) <=57 U/L    ALT 97 (H) 10 - 49 U/L    Alkaline Phosphatase 247 (H) 46 - 116 U/L     MELD-Na score: 15 at 06/06/2021  8:58 AM  MELD score: 15 at 06/06/2021  8:58 AM  Calculated from:  Serum Creatinine: 0.71 mg/dL (Using min of 1 mg/dL) at 06/09/6961  9:52 AM  Serum Sodium: 137 mmol/L at 06/06/2021  8:58 AM  Total Bilirubin: 4.1 mg/dL at 06/10/1323  4:01 AM  INR(ratio): 1.29 at 06/06/2021  8:58 AM  Age: 6 years

## 2021-06-17 DIAGNOSIS — K7210 Chronic hepatic failure without coma: Principal | ICD-10-CM

## 2021-06-17 NOTE — Unmapped (Signed)
On June 17, 2021 received referral from Dr. Greggory Keen and Dr. Piedad Climes for liver transplant evaluation. [name] is a 40 y.o. 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  A request will be sent to the [facility] for insurance verification.

## 2021-06-20 NOTE — Unmapped (Signed)
Received referral to verify patients [employer] has been verified.      Patient has active coverage with [employer] [place], including prescription drug coverage via [employer] [phone]. Patient also has Medi-share per Britta Mccreedy in contracts this is not insurance and will only use patients primacy coverage of BCBS     Authorization is not required for Liver transplant evaluation.      Patient is financially cleared for Liver transplant evaluation

## 2021-06-28 DIAGNOSIS — N471 Phimosis: Secondary | ICD-10-CM | POA: Diagnosis not present

## 2021-06-28 NOTE — Unmapped (Signed)
Called patient to discuss liver transplant evaluation but could not reach him. Left VM with contact information and requested a call back.

## 2021-06-29 DIAGNOSIS — K7210 Chronic hepatic failure without coma: Principal | ICD-10-CM

## 2021-06-29 NOTE — Unmapped (Signed)
On June 29, 2021, call placed to Mr. Mcnulty to discuss Liver Transplant Evaluation process and to introduce myself as a member of the transplant team at [facility] and his transplant nurse coordinator.    Reviewed and provided 21 minute direct nursing education related to the Liver transplant evaluation process and explained that I have mailed a packet of information regarding the Liver Transplant program to her home.  Reviewed what to expect to receive: [facility] transplant patient handbook and 2 gram Sodium diet handouts, & [facility] house information  Encourage [name] to call with questions once he has received the packet.  Emphasized support persons being present throughout the transplant process from pre to post and the importance of support person coming to clinic visits and evaluation appointments. Provided my direct contact and informed that he can directly my chart message me, in addition provided the on-call 24-7 transplant nurse line for urgent questions/ concerns. Provided the [place] Consultation Card for OSH providers to talk with [facility] Liver doctors, reviewed if admitted/ or seen in ER of another hospital outside of [facility] that  Mr. Senna or family can provide this card so the team can talk with [facility] Liver team . Mr. Patin requested to provide:none and made aware that they need to complete these pre-liver check list items within next 30 days and/ or have these records faxed to my office.       Reviewed [facility] clinical and candidate information and completed with [name] during phone call.      New Transplant Evaluation Intake Form    ?? Candidate Information   State of Permanent Residence: [place]   Permanent Zip Code: [address]   Ethnicity/ Race: White   Citizenship:[place] Citizen   Highest Education Level: HIGH SCHOOL (9-12) or GED    Patient on Life Support: no    Functional Status:80, Normal activity with effort; some signs or symptoms of disease (ECOG equivalent 1)   Working For Income:yes    ?? Clinical Information   Previous Transplants?:no   Previous Pancreas Islet Infusion:no   Primary Diagnosis:Non-Alcoholic Fatty Liver Disease (NAFLD)   Secondary Diagnosis:    Diabetes: Type II    Any Previous Malignancy: no   Has the candidate ever had a diagnosis of HCC? no  ?? Liver Medical Factors   Previous Abdominal Surgery:yes}   Spontaneous Bacterial Peritonitis:no   History of Portal Vein Thrombosis:no   Transjugular Intrahepatic Portosystemic Shunt (TIPS): no   Diabetes: Type II    Smoker:no   History of Gastric or Esophageal Varices:yes Grade unknown, banded:  no   History of Upper GI Bleed:no   History of Lower GI Bleed:no   Encephalopathy: yes   Ascites:yes, not requiring Large Volume Paracentesis    Answered all questions by Mr. Greenleaf and family.  Encouraged [name] to contact me if questions or concerns arise.

## 2021-06-30 MED FILL — XIFAXAN 550 MG TABLET: ORAL | 30 days supply | Qty: 60 | Fill #2

## 2021-06-30 NOTE — Unmapped (Signed)
[facility] Refill Coordination Note    Specialty Medication(s) to be Shipped:   Infectious Disease: Xifaxan    Other medication(s) to be shipped: No additional medications requested for fill at this time     Derek Boyer, DOB: Jul 24, 1981  Phone: [phone] (home)       All above HIPAA information was verified with patient's caregiver, Derek Boyer     Was a translator used for this call? No    Completed refill call assessment today to schedule patient's medication shipment from the [facility] [phone]).  All relevant notes have been reviewed.     Specialty medication(s) and dose(s) confirmed: Regimen is correct and unchanged.   Changes to medications: Derek Boyer reports no changes at this time.  Changes to insurance: No  New side effects reported not previously addressed with a pharmacist or physician: None reported  Questions for the pharmacist: No    Confirmed patient received a Conservation officer, historic buildings and a Surveyor, mining with first shipment. The patient will receive a drug information handout for each medication shipped and additional FDA Medication Guides as required.       DISEASE/MEDICATION-SPECIFIC INFORMATION        N/A    SPECIALTY MEDICATION ADHERENCE     Medication Adherence    Patient reported X missed doses in the last month: 0  Specialty Medication: Xifaxan 550mg   Patient is on additional specialty medications: No        Were doses missed due to medication being on hold? No    Xifaxan 550 mg: 2 days of medicine on hand     REFERRAL TO PHARMACIST     Referral to the pharmacist: Not needed      [facility]     Shipping address confirmed in Epic.     Delivery Scheduled: Yes, Expected medication delivery date: 07/01/2021.     Medication will be delivered via UPS to the prescription address in Epic WAM.    Derek Boyer [facility] Specialty Technician

## 2021-07-04 ENCOUNTER — Emergency Department
Admit: 2021-07-04 | Discharge: 2021-07-05 | Disposition: A | Discharge status: Home / Self Care | Payer: PRIVATE HEALTH INSURANCE | Attending: Student in an Organized Health Care Education/Training Program

## 2021-07-04 ENCOUNTER — Ambulatory Visit
Admit: 2021-07-04 | Discharge: 2021-07-05 | Disposition: A | Discharge status: Home / Self Care | Payer: PRIVATE HEALTH INSURANCE | Attending: Student in an Organized Health Care Education/Training Program

## 2021-07-04 DIAGNOSIS — K7290 Hepatic failure, unspecified without coma: Principal | ICD-10-CM

## 2021-07-04 DIAGNOSIS — K7460 Unspecified cirrhosis of liver: Principal | ICD-10-CM

## 2021-07-04 DIAGNOSIS — R140 Abdominal distension (gaseous): Principal | ICD-10-CM

## 2021-07-04 DIAGNOSIS — Z7984 Long term (current) use of oral hypoglycemic drugs: Secondary | ICD-10-CM | POA: Diagnosis not present

## 2021-07-04 DIAGNOSIS — R109 Unspecified abdominal pain: Secondary | ICD-10-CM | POA: Diagnosis not present

## 2021-07-04 DIAGNOSIS — R188 Other ascites: Secondary | ICD-10-CM | POA: Diagnosis not present

## 2021-07-04 DIAGNOSIS — I10 Essential (primary) hypertension: Secondary | ICD-10-CM | POA: Diagnosis not present

## 2021-07-04 DIAGNOSIS — Z8719 Personal history of other diseases of the digestive system: Secondary | ICD-10-CM | POA: Diagnosis not present

## 2021-07-04 DIAGNOSIS — R5383 Other fatigue: Secondary | ICD-10-CM | POA: Diagnosis not present

## 2021-07-04 DIAGNOSIS — R0989 Other specified symptoms and signs involving the circulatory and respiratory systems: Secondary | ICD-10-CM | POA: Diagnosis not present

## 2021-07-04 DIAGNOSIS — E669 Obesity, unspecified: Secondary | ICD-10-CM | POA: Diagnosis not present

## 2021-07-04 DIAGNOSIS — Z20822 Contact with and (suspected) exposure to covid-19: Secondary | ICD-10-CM | POA: Diagnosis not present

## 2021-07-04 DIAGNOSIS — E119 Type 2 diabetes mellitus without complications: Secondary | ICD-10-CM | POA: Diagnosis not present

## 2021-07-04 DIAGNOSIS — R059 Cough, unspecified: Secondary | ICD-10-CM | POA: Diagnosis not present

## 2021-07-04 DIAGNOSIS — Z79899 Other long term (current) drug therapy: Secondary | ICD-10-CM | POA: Diagnosis not present

## 2021-07-04 DIAGNOSIS — K7581 Nonalcoholic steatohepatitis (NASH): Secondary | ICD-10-CM | POA: Diagnosis not present

## 2021-07-04 LAB — COMPREHENSIVE METABOLIC PANEL
ALBUMIN: 3 g/dL — ABNORMAL LOW (ref 3.4–5.0)
ALKALINE PHOSPHATASE: 261 U/L — ABNORMAL HIGH (ref 46–116)
ALT (SGPT): 95 U/L — ABNORMAL HIGH (ref 10–49)
ANION GAP: 8 mmol/L (ref 5–14)
AST (SGOT): 182 U/L — ABNORMAL HIGH (ref <=34)
BILIRUBIN TOTAL: 5.1 mg/dL — ABNORMAL HIGH (ref 0.3–1.2)
BLOOD UREA NITROGEN: 15 mg/dL (ref 9–23)
BUN / CREAT RATIO: 21
CALCIUM: 9.3 mg/dL (ref 8.7–10.4)
CHLORIDE: 102 mmol/L (ref 98–107)
CO2: 24 mmol/L (ref 20.0–31.0)
CREATININE: 0.73 mg/dL
EGFR CKD-EPI (2021) MALE: >90 mL/min/{1.73_m2} (ref >=60)
GLUCOSE RANDOM: 93 mg/dL (ref 70–179)
POTASSIUM: 4.1 mmol/L (ref 3.4–4.8)
PROTEIN TOTAL: 7.6 g/dL (ref 5.7–8.2)
SODIUM: 134 mmol/L — ABNORMAL LOW (ref 135–145)

## 2021-07-04 LAB — CBC W/ AUTO DIFF
BASOPHILS ABSOLUTE COUNT: 0 10*9/L (ref 0.0–0.1)
BASOPHILS RELATIVE PERCENT: 0.5 %
EOSINOPHILS ABSOLUTE COUNT: 0.1 10*9/L (ref 0.0–0.5)
EOSINOPHILS RELATIVE PERCENT: 2.4 %
HEMATOCRIT: 45.1 % (ref 39.0–48.0)
HEMOGLOBIN: 15.7 g/dL (ref 12.9–16.5)
LYMPHOCYTES ABSOLUTE COUNT: 1.1 10*9/L (ref 1.1–3.6)
LYMPHOCYTES RELATIVE PERCENT: 20 %
MEAN CORPUSCULAR HEMOGLOBIN CONC: 34.8 g/dL (ref 32.0–36.0)
MEAN CORPUSCULAR HEMOGLOBIN: 33.6 pg — ABNORMAL HIGH (ref 25.9–32.4)
MEAN CORPUSCULAR VOLUME: 96.5 fL — ABNORMAL HIGH (ref 77.6–95.7)
MEAN PLATELET VOLUME: 11.5 fL — ABNORMAL HIGH (ref 6.8–10.7)
MONOCYTES ABSOLUTE COUNT: 0.6 10*9/L (ref 0.3–0.8)
MONOCYTES RELATIVE PERCENT: 10.9 %
NEUTROPHILS ABSOLUTE COUNT: 3.5 10*9/L (ref 1.8–7.8)
NEUTROPHILS RELATIVE PERCENT: 66.2 %
PLATELET COUNT: 103 10*9/L — ABNORMAL LOW (ref 150–450)
RED BLOOD CELL COUNT: 4.68 10*12/L (ref 4.26–5.60)
RED CELL DISTRIBUTION WIDTH: 15.8 % — ABNORMAL HIGH (ref 12.2–15.2)
WBC ADJUSTED: 5.4 10*9/L (ref 3.6–11.2)

## 2021-07-04 LAB — URINALYSIS WITH CULTURE REFLEX
BACTERIA: NONE SEEN /HPF
BLOOD UA: NEGATIVE
GLUCOSE UA: NEGATIVE
KETONES UA: NEGATIVE
LEUKOCYTE ESTERASE UA: NEGATIVE
NITRITE UA: NEGATIVE
PH UA: 5.5 (ref 5.0–9.0)
PROTEIN UA: NEGATIVE
RBC UA: <1 /HPF (ref <=3)
SPECIFIC GRAVITY UA: 1.026 (ref 1.003–1.030)
SQUAMOUS EPITHELIAL: <1 /HPF (ref 0–5)
UROBILINOGEN UA: <2
WBC UA: 1 /HPF (ref <=2)

## 2021-07-04 LAB — SLIDE REVIEW

## 2021-07-04 LAB — PROTIME-INR
INR: 1.25
PROTIME: 14.6 s — ABNORMAL HIGH (ref 10.3–13.4)

## 2021-07-04 LAB — MAGNESIUM: MAGNESIUM: 1.7 mg/dL (ref 1.6–2.6)

## 2021-07-04 NOTE — Unmapped (Signed)
VM from patient stating that he has not been feeling well over the weekend    States he is very lethargic     His son's birthday was yesterday and he slept most of the day on the couch     He is retaining fluid--he said that he weighed 207 lbs either Friday or Saturday and was 216 lbs yesterday (Sunday)--today he is 214 lbs (Monday). He describes his belly as being very tight.    He also describes a hacking, non-productive cough     He said that he had reached out to the nurse at his PCP office and they were going to try and get him in the office today    I recommended that he come to the [facility] ED today given that he is being worked up for liver transplant--he said that he could have someone bring him here    I told him that I would notify his liver transplant coordinator of the situation    -Lanora Manis, Charity fundraiser

## 2021-07-04 NOTE — Unmapped (Signed)
[facility]  Emergency Department Medical Screening Examination     Subjective     Derek Boyer is a 40 y.o. male presenting for evaluation of Abdominal Pain. Patient reports increased generalized fatigue and with associated cough and ascites. The patient is currently on the list to receive a liver transplantHe denies any fevers, chills, melena, rhinorrhea,      Abbreviated Review of Systems/Covid Screen  Constitutional: Negative for fever  Respiratory: Positive for cough. Negative for difficulty breathing.    Objective     ED Triage Vitals [07/04/21 1257]   Enc Vitals Group      BP 161/97      Heart Rate (!) 37      SpO2 Pulse       Resp 18      Temp 36.6 ??C (97.9 ??F)      Temp Source Oral      SpO2 100 %        Focused Physical Exam  Constitutional: No acute distress.  Respiratory: Non-labored respirations.  Neurological: Clear speech. No gross focal neurologic deficits are appreciated.  ?  Assessment & Plan     On exam, patient is non-toxic appearing and in no acute distress. Hypertensive to 161/97, otherwise, VSS. Afebrile. Plan for basic labs, PT-INR, magnesium, and COVID PCR. Will defer the remainder of work-up to main ED provider when bed becomes available.     A medical screening exam has been performed. At the time of this evaluation, no emergency medical condition requiring immediate stabilization has been identified nor is there suspicion for imminent decompensation. Appropriate triage protocols will be implemented and a comprehensive ED evaluation with disposition will be completed by a healthcare provider when an appropriate ED location becomes available. The patient is aware that this is an initial encounter only and verbalizes understanding and agreement with the plan.     Emergency Department operations continue to be impacted by the COVID-19 pandemic.     Documentation assistance was provided by Vela Prose, Scribe on July 04, 2021 at 12:59 PM for Marcelyn Bruins, MD.      A scribe was used when documenting this visit. I agree with the above documentation. Signed by  Marcelyn Bruins on  July 04, 2021 at 5:18 PM

## 2021-07-04 NOTE — Unmapped (Signed)
Pt reports increased generalized fatigue. Pt states increased ascites and has gained approximately 8-9lbs of weight. On list for liver transplant.

## 2021-07-05 NOTE — Unmapped (Signed)
Paged by ED about patient. Briefly, 40 y.o. male with a PMHx of T2DM, obesity, HTN and NASH cirrhosis c/b non-bleeding G1EVs, HE, and ascites who is here with increasing abdominal distension. CBC, CMP, coags are at baseline. On bedside abdominal ultrasound, no safe pocket for paracentesis found. Patient otherwise appears well-appearing per report and mental status at baseline.     Recommended infectious workup with UA/UCx and blood cultures as well as doppler RUQUS. Discharge planning deferred to ER evaluation and recommendation.     I have neither seen nor examined this patient, I have only discussed the patient with the ED provider and briefly reviewed the patient's chart and outside records. The decision about admission vs discharge ultimately lies with the ED provider  who has evaluated the patient in-person.

## 2021-07-05 NOTE — Unmapped (Signed)
[facility] Emergency Department Provider Note      ED Course, Assessment and Plan     Initial Clinical Impression:    July 04, 2021 6:45 PM   [name] is a 40 y.o. male with a PMHx of T2DM, obesity, HTN and NASH cirrhosis c/b non-bleeding G1EVs, HE, and ascites that presents with increasing abdominal distention, weight gain and fatigue over the last 3 to 4 days, as described below. On exam, Vital signs stable.  Overall well-appearing.  Normal cardiopulmonary exam.  Mild to moderate abdominal distention, no tenderness, no guarding or rebound.  Normal neurologic exam.    BP 131/54  - Pulse 87  - Temp 36.9 ??C (98.4 ??F) (Oral)  - Resp 16  - SpO2 99%     Patient presenting with increasing abdominal distention and pain in the setting of known decompensated cirrhosis.  Patient states that over the last 3 to 4 days he is gained almost 8 pounds.  Denies any fever, chills, nausea, vomiting or diarrhea.  He does endorse generalized malaise and increasing shortness of breath of the last few days as well.  No chest pain, no increasing lower extremity edema.  No history of SBP or LVP.  On exam overall well-appearing, hemodynamically stable.  Exam as above.  Differential includes SBP, worsening portal hypertension/ascites in the setting of known cirrhosis, PVT, among others.  Overall low suspicion for SBP in the absence of fever, leukocytosis or any discernible abdominal tenderness.  Will obtain basic labs, electrolytes, urinalysis, EKG, CXR for evaluation.  We will also obtain RUQ Doppler ultrasound and attempt paracentesis.    ED Course as of 07/05/21 0216   Mon Jul 04, 2021   1916 Sodium(!): 134   1916 Creatinine: 0.73   1916 Total Bilirubin(!): 5.1   1916 AST(!): 182   1916 ALT(!): 95   1916 Alkaline Phosphatase(!): 261   1916 Platelet(!): 103   1916 XR Chest 2 views  Low lung volumes; no evidence of acute cardiopulmonary pathology.   2032 Bedside POCUS with 1 to 2 cm of ascitic fluid, but with no safe pocket to tap due to bowel.  Plan for liver Doppler and hepatology consultation.   2051 Hepatology fellow paged   2124 Spoke with hepatology fellow-- she will defer dispo decision to [place]; however, based on labs and our report, she agrees with our current workup. If RUQ [place] negative for acute abnormality, plan for dc home with close f/u   2204 US Liver Doppler  -Patent hepatic vasculature with normal flow direction.  -Heterogeneous, nodular liver consistent with cirrhosis.  -Sequela of portal hypertension including splenomegaly and small volumes of ascites.      Will discharge patient home with hepatology follow-up.  On chart review it appears as though he has a scheduled appointment with transplant hepatology on 9/13.  Suspect that symptoms are due to underlying cirrhosis, and may benefit from titration of diuretic medication as an outpatient.  Overall very low suspicion for significant infection such as SBP at this time.  We will provide the patient with very strict return precautions including any worsening abdominal pain, fever, chills, intractable nausea or vomiting.  Patient is amenable to plan.    _____________________________________________________________________    The case was discussed with attending physician who is in agreement with the above assessment and plan    Dictation software was used while making this note. Please excuse any errors made with dictation software.    Additional Medical Decision Making  I have reviewed the vital signs and the nursing notes. Labs and radiology results that were available during my care of the patient were independently reviewed by me and considered in my medical decision making.     I independently visualized the EKG tracing if performed  I independently visualized the radiology images if performed  I reviewed the patient's prior medical records if available.  Additional history obtained from family if available    History     CHIEF COMPLAINT:   Chief Complaint   Patient presents with   ??? Abdominal Pain       HPI: [name] is a 40 y.o. male with a PMHx of T2DM, obesity, HTN and NASH cirrhosis c/b non-bleeding G1EVs, HE, and ascites that presents with increasing abdominal distention, weight gain and fatigue over the last 3 to 4 days. Patient states that over the last 3 to 4 days he has gained almost 8 pounds.  Denies any chest pain,  fever, chills, nausea, vomiting or diarrhea.  He does endorse generalized malaise and increasing shortness of breath of the last few days as well.  No chest pain, no increasing lower extremity edema.  No history of SBP or previous LVP.  Currently takes Lasix and spironolactone, but has not missed any doses recently.    PAST MEDICAL HISTORY/PAST SURGICAL HISTORY:   Past Medical History:   Diagnosis Date   ??? Diabetes mellitus (CMS-HCC)        Past Surgical History:   Procedure Laterality Date   ??? PR UPPER GI ENDOSCOPY,DIAGNOSIS N/A 09/10/2020    Procedure: UGI ENDO, INCLUDE ESOPHAGUS, STOMACH, & DUODENUM &/OR JEJUNUM; DX W/WO COLLECTION SPECIMN, BY BRUSH OR WASH;  Surgeon: Janyth Pupa, MD;  Location[facility];  Service: Gastroenterology       MEDICATIONS:   No current facility-administered medications for this encounter.    Current Outpatient Medications:   ???  baclofen (LIORESAL) 10 MG tablet, Take 2 tablets (20 mg total) by mouth nightly., Disp: 60 tablet, Rfl: 5  ???  furosemide (LASIX) 20 MG tablet, Take 1 tablet (20 mg total) by mouth daily., Disp: 90 tablet, Rfl: 3  ???  lactulose (CHRONULAC) 10 gram/15 mL solution, Take 30 mL (20 g total) by mouth Three (3) times a day., Disp: 2700 mL, Rfl: 5  ???  metFORMIN (GLUCOPHAGE-XR) 500 MG 24 hr tablet, Take 500 mg by mouth daily with evening meal. , Disp: , Rfl:   ???  multivitamin/iron/folic acid (CENTRUM ORAL), , Disp: , Rfl:   ???  omeprazole (PRILOSEC) 20 MG capsule, Take 1 capsule (20 mg total) by mouth every morning before breakfast., Disp: 90 capsule, Rfl: 1  ???  rifAXIMin (XIFAXAN) 550 mg Tab, Take 1 tablet (550 mg total) by mouth Two (2) times a day., Disp: 180 tablet, Rfl: 3  ???  spironolactone (ALDACTONE) 50 MG tablet, Take 1 tablet (50 mg total) by mouth daily., Disp: 90 tablet, Rfl: 3    ALLERGIES:   Patient has no known allergies.    SOCIAL HISTORY:   Social History     Tobacco Use   ??? Smoking status: Never Smoker   ??? Smokeless tobacco: Never Used   Substance Use Topics   ??? Alcohol use: Never       FAMILY HISTORY:  History reviewed. No pertinent family history.       Review of Systems    A 10 point review of systems was performed and is negative other than positive elements  noted in HPI   Constitutional: Negative for fever.  Eyes: Negative for visual changes.  ENT: Negative for sore throat.  Cardiovascular: No chest pain.  Respiratory: Negative for shortness of breath.  Gastrointestinal: Negative for abdominal pain, vomiting or diarrhea. + abdominal distension  Genitourinary: Negative for dysuria.  Musculoskeletal: Negative for back pain.  Skin: Negative for rash.  Neurological: Negative for headaches, focal weakness or numbness.    Physical Exam     VITAL SIGNS:    BP 131/54  - Pulse 87  - Temp 36.9 ??C (98.4 ??F) (Oral)  - Resp 16  - SpO2 99%     Constitutional: Alert and oriented. Well appearing and in no distress.  Eyes: Conjunctivae are normal.  ENT       Head: Normocephalic and atraumatic.       Nose: No congestion.       Mouth/Throat: Mucous membranes are moist.       Neck: No stridor.  Cardiovascular: Normal rate, regular rhythm. 2+ radial pulses equal bilaterally. <2 second cap refill.  Respiratory: Normal respiratory effort. Breath sounds are normal.  Gastrointestinal: Mild to moderate abdominal distention, no tenderness, no guarding or rebound.  Genitourinary: No suprapubic tenderness  Musculoskeletal: Normal range of motion in all extremities.   Neurologic: Normal speech and language. No gross focal neurologic deficits are appreciated.  Skin: Skin is warm, dry. No rash noted.  Psychiatric: Mood and affect are normal. Speech and behavior are normal.     Radiology     US Liver Doppler   Final Result   -Patent hepatic vasculature with normal flow direction.   -Heterogeneous, nodular liver consistent with cirrhosis.   -Sequela of portal hypertension including splenomegaly and small volumes of ascites.         Please see below for data measurements:      Liver: 16.8 cm   Common bile duct: 0.59 cm   Right kidney: 11.1 cm    Left kidney: 12.7 cm       Main portal vein diameter: 1.4 cm   Main portal vein velocity: 0.27 m/s   Anterior right portal vein velocity: 0.23 m/s   Posterior right portal vein velocity: 0.32 m/s   Left portal vein velocity: 0.28 m/s      Main portal vein flow: hepatopetal   Right portal vein flow: hepatopetal   Left portal vein flow: hepatopetal      Common hepatic artery: Patent      Left hepatic vein flow: biphasic   Middle hepatic vein flow: biphasic   Right hepatic vein flow: biphasic   Inferior vena cava flow: bi-tri      Splenic vein midline: Non Vis    Splenic vein proximal: hepatopetal       Aorta: partially visualized   Inferior vena cava: Partially visualized      Spleen: 20.7 cm      Abdominal free fluid visualized: yes         XR Chest 2 views   Final Result      Low lung volumes; no evidence of acute cardiopulmonary pathology.        XR Chest 2 views    Result Date: 07/04/2021  EXAM: XR CHEST 2 VIEWS DATE: 07/04/2021 6:08 PM ACCESSION: [id number] UN DICTATED: 07/04/2021 6:10 PM INTERPRETATION LOCATION: [facility] CLINICAL INDICATION: 40 years old Male with COUGH  COMPARISON: None TECHNIQUE: PA and Lateral Chest Radiographs. FINDINGS: There are low lung volumes. The lungs are clear. No pleural effusion or pneumothorax  is identified. The cardiomediastinal silhouette appears unremarkable. There are degenerative changes of the spine.     Low lung volumes; no evidence of acute cardiopulmonary pathology.    US Liver Doppler    Result Date: 07/04/2021  EXAM: US LIVER DOPPLER DATE: 07/04/2021 9:36 PM ACCESSION: [id number] UN DICTATED: 07/04/2021 9:31 PM INTERPRETATION LOCATION: [facility] CLINICAL INDICATION: 40 years old Male with Hx of cirrhosis, increasing abdominal distension, pain, eval for PVT  TECHNIQUE: Ultrasound views of the complete abdomen were obtained using gray scale and color and spectral Doppler imaging. COMPARISON: MRI abdomen 01/28/2021 FINDINGS: LIVER: The liver is heterogeneous. No focal hepatic lesions. No intrahepatic biliary ductal dilatation. The common bile duct is normal in caliber. GALLBLADDER: Surgically absent. PANCREAS: Not visualized due to overlying bowel gas at the midline. SPLEEN: Enlarged. No focal lesion. KIDNEYS: Inferior poles are poorly visualized due to overlying rib shadows. Normal in size and echotexture. No solid masses or calculi. No hydronephrosis. VESSELS - Portal vein: The main, left and right portal veins are patent with hepatopetal flow. Normal main portal vein velocity (0.20 m/s or greater) - Splenic vein: Patent, with hepatopetal flow. - Hepatic veins/IVC: The IVC, left, middle and right hepatic veins are patent with bi/triphasic waveforms. - Hepatic artery: Patent -Aorta: Poorly visualized due to overlying bowel gas. OTHER: Small volume abdominal and pelvic ascites including perihepatic ascites.     -Patent hepatic vasculature with normal flow direction. -Heterogeneous, nodular liver consistent with cirrhosis. -Sequela of portal hypertension including splenomegaly and small volumes of ascites. Please see below for data measurements: Liver: 16.8 cm Common bile duct: 0.59 cm Right kidney: 11.1 cm Left kidney: 12.7 cm Main portal vein diameter: 1.4 cm Main portal vein velocity: 0.27 m/s Anterior right portal vein velocity: 0.23 m/s Posterior right portal vein velocity: 0.32 m/s Left portal vein velocity: 0.28 m/s Main portal vein flow: hepatopetal Right portal vein flow: hepatopetal Left portal vein flow: hepatopetal Common hepatic artery: Patent Left hepatic vein flow: biphasic Middle hepatic vein flow: biphasic Right hepatic vein flow: biphasic Inferior vena cava flow: bi-tri Splenic vein midline: Non Vis Splenic vein proximal: hepatopetal Aorta: partially visualized Inferior vena cava: Partially visualized Spleen: 20.7 cm Abdominal free fluid visualized: yes           Pertinent labs & imaging results that were available during my care of the patient were reviewed by me and considered in my medical decision making (see chart for details).    Please note- This chart has been created using [employer]. Chart creation errors have been sought, but may not always be located and such creation errors, especially pronoun confusion, do NOT reflect on the standard of medical care.      Luci Bank, MD  [facility] Anesthesiology, PGY-2       Altamese Leith-Hatfield, MD  Resident  07/05/21 [phone]

## 2021-07-05 NOTE — Unmapped (Signed)
Spoke to patient to follow up regarding his recent visit to the ED.  He is feeling better, and his weight is back to 214 this morning.    Encouraged him to reach out to the on call coordinator in the future with any urgent issues.

## 2021-07-13 DIAGNOSIS — K7210 Chronic hepatic failure without coma: Principal | ICD-10-CM

## 2021-07-19 ENCOUNTER — Ambulatory Visit: Admit: 2021-07-19 | Discharge: 2021-07-20 | Payer: PRIVATE HEALTH INSURANCE

## 2021-07-20 ENCOUNTER — Institutional Professional Consult (permissible substitution): Admit: 2021-07-20 | Discharge: 2021-07-21 | Payer: PRIVATE HEALTH INSURANCE

## 2021-07-20 ENCOUNTER — Institutional Professional Consult (permissible substitution)
Admit: 2021-07-20 | Discharge: 2021-07-21 | Payer: PRIVATE HEALTH INSURANCE | Attending: Psychologist | Primary: Psychologist

## 2021-07-20 NOTE — Unmapped (Signed)
CONFIDENTIAL PSYCHOLOGICAL EVALUATION FOR TRANSPLANT    Patient Name: Derek Boyer  Medical Record Number: [id number]  Date of Service: July 20, 2021  Clinical Psychologist: Deedra Ehrich, PsyD  Evaluation Duration and Procedures: 60 minute clinical interview; record review; case consultation;          The patient reports they are currently: at home. I spent 60 minutes on the phone visit with the patient on the date of service. I spent an additional 60 minutes on pre- and post-visit activities on the date of service.     The patient was not located and I was not located within 250 yards of a hospital based location during the phone visit. The patient was physically located in [place] or a state in which I am permitted to provide care. The patient and/or parent/guardian understood that s/he may incur co-pays and cost sharing, and agreed to the telemedicine visit. The visit was reasonable and appropriate under the circumstances given the patient's presentation at the time.    The patient and/or parent/guardian has been advised of the potential risks and limitations of this mode of treatment (including, but not limited to, the absence of in-person examination) and has agreed to be treated using telemedicine. The patient's/patient's family's questions regarding telemedicine have been answered.    If the visit was completed in an ambulatory setting, the patient and/or parent/guardian has also been advised to contact their provider???s office for worsening conditions, and seek emergency medical treatment and/or call 911 if the patient deems either necessary.    This evaluation note may contain sensitive and confidential information regarding the patient???s psychosocial adjustment to living with a chronic medical condition. DO NOT share this information outside [facility] without written consent from the patient explicitly stating that mental health records may be released.     The limits of confidentiality and the purpose of the evaluation were reviewed. The patient was provided with a verbal description of the nature and purpose of the psychological evaluation. I also reviewed the referral source, specific referral question for this evaluation, foreseeable risks/discomforts, benefits, limits of confidentiality, and mandatory reporting requirements of this provider. The patient was given the opportunity to ask questions and receive answers about the present evaluation. Oral consent was provided by the patient.     BACKGROUND INFORMATION/REASON FOR REFERRAL: Derek Boyer was seen for a psychological evaluation as one part of a comprehensive assessment for liver transplantation and for treatment planning. He is a 40 y.o. married Caucasian male from [place], [place]. He has been diagnosed with NASH cirrhosis.     BEHAVIORAL OBSERVATIONS:   [name] arrived for his appointment on time. He was interviewed with his wife present. Rapport was easily established. He did not seem motivated to present  himself in an overly favorable light.    MENTAL STATUS EXAM:  Appearance: unable to assess  Motor: unable to assess  Speech/Language: Normal rate, volume, tone, fluency  Mood: good  Affect: Calm and Cooperative  Thought Process: Logical, linear, clear, coherent, goal directed  Thought Content: Denies SI, HI, self harm, delusions, obsessions, paranoid ideation, or ideas of reference  Perceptual Disturbances: Denies auditory and visual hallucinations, behavior not concerning for response to internal stimuli  Orientation: Oriented to person, place, time, and general circumstances  Attention: Able to fully attend without fluctuations in consciousness  Concentration: Able to fully concentrate and attend  Memory: Immediate, short-term, long-term, and recall grossly intact  Fund of Knowledge: Consistent with level of education and development  Insight: Intact  Judgment: Intact  Impulse Control: Intact    HEALTH HISTORY:  Onset: about 10 years ago, fatty liver, told to lose weight, 2018 dx with gall stones and cirrhosis was found during gall bladder surgery  Current Symptoms: some HE, irritability, fatigue, sleep is not great due to leg cramps, itching  Pain (0=no pain; 10=worst pain imaginable): 0/10  Pain Medications:  n/a  Family Health History: No family history on file.  has no family status information on file.     Medications:   Current Outpatient Medications   Medication Sig Dispense Refill   ??? baclofen (LIORESAL) 10 MG tablet Take 2 tablets (20 mg total) by mouth nightly. 60 tablet 5   ??? furosemide (LASIX) 20 MG tablet Take 1 tablet (20 mg total) by mouth daily. 90 tablet 3   ??? lactulose (CHRONULAC) 10 gram/15 mL solution Take 30 mL (20 g total) by mouth Three (3) times a day. 2700 mL 5   ??? metFORMIN (GLUCOPHAGE-XR) 500 MG 24 hr tablet Take 500 mg by mouth daily with evening meal.      ??? multivitamin/iron/folic acid (CENTRUM ORAL)      ??? omeprazole (PRILOSEC) 20 MG capsule Take 1 capsule (20 mg total) by mouth every morning before breakfast. 90 capsule 1   ??? rifAXIMin (XIFAXAN) 550 mg Tab Take 1 tablet (550 mg total) by mouth Two (2) times a day. 180 tablet 3   ??? spironolactone (ALDACTONE) 50 MG tablet Take 1 tablet (50 mg total) by mouth daily. 90 tablet 3     No current facility-administered medications for this visit.     Psychiatric/Medical History:  Past Medical History:   Diagnosis Date   ??? Diabetes mellitus (CMS-HCC)      Surgical History:  Past Surgical History:   Procedure Laterality Date   ??? PR UPPER GI ENDOSCOPY,DIAGNOSIS N/A 09/10/2020    Procedure: UGI ENDO, INCLUDE ESOPHAGUS, STOMACH, & DUODENUM &/OR JEJUNUM; DX W/WO COLLECTION SPECIMN, BY BRUSH OR WASH;  Surgeon: Janyth Pupa, MD;  [facility];  Service: Gastroenterology       ADHERENCE ISSUES:  Diet: Type: Low sodium and high protein  Diet Adherence: Good; Appetite has been poor but pt makes effort to get nutrition in through protein shakes  Medication Management: Good  Medication Independence: Independent  Medication Adherence: Good  Medication Concerns: denied problems taking medications, concerns about side effects, affordability, problems obtaining medications, and difficulty remembering medications  Attendance to appointments: Good     Health Literacy Estimation:  Good     Labwork: Good  Dialysis: N/A  Fluid Restrictions: N/A  Diabetes:  Good  Sleep Apnea:  N/A      SUBSTANCE USE ISSUES:   Nicotine/Tobacco: Denied   Current alcohol use: Denied any use since liver disease   Heaviest use in the past: Pt drank in a social manner prior to learning of liver disease   Illicit drug use: Denied  -If yes type: N/A  Licit substance abuse or misuse: Denied  Alcohol or drug-related legal problems: Denied  Relapse risk: low    TRANSPLANT ISSUES:  Want transplant? yes   Attitude toward transplant: positive, if I need it, let's do it  Fears/Concerns about transplant: normal fears of having surgery and a a long recovery He also thinks about his 42 year old being able to cope with it.   Understanding of the transplant process: Good  Caregivers: Wife, father, and several other family member  Ashby Dawes of  caregiver's relationship to recipient: positive, supportive  Goals for the future/expectations after transplant: realistic  Pain Coping: Good    MENTAL HEALTH HISTORY:   Psychiatric diagnoses: Denied   Pt reported he has always experienced some social anxiety throughout his life. He reported he gets nervous around crowds and new people.   Psychiatric hospitalizations: Denied  Previous treatment: Denied  Suicide attempts: Denied  Suicidal or homicidal ideation: Denied  Thoughts of death: Denied  Self-Injurious behavior: Denied  Family mental health history: Alcoholism runs in his family; paternal grandfather died by suicide  Psychiatric medication: Denied  Mental health treatment: Denied  Psychotic symptoms: Denied  Past trauma/abuse: Denied  Aggression: Denied  Impulsive behaviors: Denied  History of head injury: Denied    Coping Style: Functional    FAMILY AND SOCIAL FUNCTIONING:   Marital status: married  Children: one 20 year old son  Current living situation: with wife and son   Recent stressors: Denied  Legal history: Denied  Education level:   Academic difficulties: Denied  Do any spiritual or religious beliefs impact medical care? 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 I reviewed the impact of the use of illicit substances, substance abuse, and alcohol use/abuse on patient and graft survival as well as factors that influence relapse risk. The consequences of noncompliance both pre- and post-transplant were reviewed. I reviewed the possibility of the development and/or worsening of psychiatric issues, including but not limited to depression, anxiety, acute and posttraumatic stress disorder, guilt, and body image issues, and encouraged the patient and their caregivers to speak with members of the transplant team if psychiatric symptoms develop or worsen. Understanding was expressed.    COLLATERAL INFORMATION:  Review of PMP Aware controlled substance database showed one prescription for 15 tablets of 50mg  Tramadol on 03/21/21.    Review of [facility] Department of [employer] Assess System showed no history.     PSYCHIATRIC DIAGNOSES:   History of social anxiety                             IMPRESSIONS:  1. Adherence: No concerns. Pt reported good adherence to medication regimen, attendance to medical appointments, and follow-up on medical recommendations.   2. Substance Use Issues: No concerns. Pt has a history of social drinking. He has not had any alcohol since his liver diagnosis.   3. Transplant Issues:  [name] appears to have a good understanding of his disease process and the transplant surgery. He was able to articulate the risks associated with surgery as well as the post-transplant care required. His expectations of life post-transplant are reasonable, and he has had significant experience with being a patient and requiring frequent medical care. He expressed commitment and motivation to follow medical advice after transplant as well.   4. Psychological Issues: No current concerns. Pt reported experiencing social anxiety when he was younger. He denied any active mental health symptoms at this time and agreed to monitor his mood and report any changes to Clinical research associate.   5. Social: No concerns. Pt appears to have stable housing and finances as well as adequate social support for transplant. Please see transplant SW assessment for more details.     RECOMMENDATIONS:  Derek Boyer is believed to be an excellent candidate  for transplant from a psychological perspective.    We have no concerns about Derek Boyer candidacy on the transplant list. To optimize his outcomes while listed and after transplant, the following is recommended:     1. Continue to monitor your mood and report any changes to transplant psychology and/or TNC.   2. Continue to adhere to all medical recommendations.   3. Continue to monitor symptoms of HE and reach out to provider and/or TNC with any change in functioning.     Should the patient or treatment team notice a change in functioning, please refer back to transplant psychology for further evaluation and treatment.    Final decision regarding listing status is based upon committee review at selection meeting.      Recommendations discussed with patient? yes  Agreed upon by patient? yes    Derek Boyer was given this writer's business card with confidential voice mail number and instructed to call 911 for emergencies.

## 2021-07-20 NOTE — Unmapped (Signed)
The patient reports they are currently: at home. I spent 70 minutes on the phone with the patient on the date of service. I spent an additional 20 minutes on pre- and post-visit activities on the date of service.     The patient was physically located in [place] or a state in which I am permitted to provide care. The patient and/or parent/guardian understood that s/he may incur co-pays and cost sharing, and agreed to the telemedicine visit. The visit was reasonable and appropriate under the circumstances given the patient's presentation at the time.    The patient and/or parent/guardian has been advised of the potential risks and limitations of this mode of treatment (including, but not limited to, the absence of in-person examination) and has agreed to be treated using telemedicine. The patient's/patient's family's questions regarding telemedicine have been answered.     If the visit was completed in an ambulatory setting, the patient and/or parent/guardian has also been advised to contact their provider???s office for worsening conditions, and seek emergency medical treatment and/or call 911 if the patient deems either necessary.          **THIS PATIENT WAS NOT SEEN IN PERSON TO MINIMIZE POTENTIAL SPREAD OF COVID-19, PROTECT PATIENTS/PROVIDERS, AND REDUCE PPE UTILIZATION.**      PATIENT NAME: Derek Boyer     MR#: [id number]    DOB: 06-27-1981      [facility]  CONFIDENTIAL SOCIAL WORK  LIVER TRANSPLANT ASSESSMENT     DATE OF EVALUATION: 07/20/2021    INFORMANTS: Jim Like Lucretia Boyer, spouse/Hayley    PREFERRED LANGUAGE: English     PRESENTING MEDICAL PROBLEMS AND RELEVANT HISTORY:   Derek Boyer is a 40 y.o. Caucasian male with a PMHx of T2DM, obesity, HTN and NASH cirrhosis c/b non-bleeding G1EVs, HE, and ascites that presents for follow-up of NASH cirrhosis.  ??  NASH Cirrhosis: Complicated by non-bleeding Grade I EVs, hepatic encephalopathy, and ascites/BLE edema. Today, he appears euvolemic on exam and reports good control of his volume status with his low-dose diuretic regimen. Major issue from a liver perspective today is hepatic encephalopathy reported by both the patient and his wife. He is relatively clear today and without asterixis, but he and his wife both report symptoms tend to be worse in the afternoon. He is at goal with bowel movements on lactulose and as such we will add rifaximin today. Labs will be updated and will order MRI for May to monitor LR-3 lesion seen on prior exam.     TRANSPLANT PHASE:  Evaluation   TRANSPLANT STATUS:  Active     Derek Boyer presents today for an initial liver transplant evaluation.      UNDERSTANDING OF MEDICAL CONDITION AND TRANSPLANT:   Derek Boyer and Hayley/wife received virtual transplant education on 06/29/21.      Pt understands transplant process: well  Participated in TeachBack method: well  Information recall: recovery, surgery, being able to receive an organ offer   Education provided: rejection and infection, caregiving     Briefly reviewed the evaluation and listing process.  Reviewed the potential surgical complications of transplant such as but not limited to bleeding, blood clots, hernia, infection, rejection and death.  In addition, informed patient of the potential psychological complications of chronic illness such as depression, anxiety, guilt and PTSD and advised that it is not uncommon for patients with a previous history of such to experience an increase in symptoms should they have a decline in their health.  Derek Boyer voiced his  understanding.      Patient verbalizes understanding that NAFLD caused his liver disease.  MELD score is 16.    ATTITUDE ABOUT TRANSPLANT:   Patient desires transplant: yes  Expectations: live longer   Fears/Concerns: 'everything'    LIVING SITUATION AND FAMILY/SOCIAL SUPPORTS:   Citizenship Status: [place] Citizen; grew up in [place]; been in [place] ~2014  Marital Status: married since 2016   Lives with: Hayley; son/Wesley (3)   Children/Dependents:  See above   Extended family: lots of friends and family nearby; [name] is from [place]; siblings in [place]  Living situation: House  Condition of home: good repair, utilities function, appliances function and Heat/AC function    Derek Boyer reports feeling supported by his friends and family.    How often do you see your doctor? (ie. 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 If yes, how long have you been employed there? April 2022; prior pharmacy tech work   Hotel manager History: No  ??? Branch: N/A  ??? Eligible for VA benefits?: N/A    MEDICAL/PRESCRIPTION COVERAGE:   Insurance:    Primary Coverage: BCBS (ACA plan)   Secondary Coverage: n/a   Tertiary Coverage: n/a  Problems with access/coverage of medical care:  denies    FINANCIAL RESOURCES:   Current income sources: patient income from PT employment; wife FT employment as a Teacher, early years/pre - household income $150k  Monthly expenses: mortgage, utilities, other customary bills  Risk of losing home/transportation: denies  Hx of significant debts: denies  Current income meets basic needs: Yes  Fundraising efforts: no    ACCESS TO TRANSPORTATION:  Patient drives: Yes  Access to reliable transportation: Yes  Other sources of transportation:  Yes     COMPLIANCE HISTORY:  Medication list available: none available  Problems obtaining medications: denies  Problems organizing medications: denies  Problems with diet regime/fluid restrictions: denies  Barriers to attending medical care: denies    FUNCTIONAL STATUS:   Derek Boyer presents as independent with all personal ADLs, including bathing, dressing, cooking, and household chores. Patient is driving and does have a valid driver's license.     ESLD effects: admits to sporadic HE/mainly in the afternoon; ascites ; fatigue   Activity level:  Active at home/work/chores  Hobbies/Interests: hiking, golfing     COGNITIVE HISTORY & HEALTH LITERACY:  Derek Boyer  seem cognitively intact. He reports history of memory or cognitive concerns related to his health.  He denies history of learning difficulties and learns best by Kinesthetic (e.g. hands on learning, doing). He says he does not need to have someone help him when he reads instructions, pamphlets or other written material from his doctor or pharmacy.    HOME HEALTH/DME AGENCY:  Current: denies  Past: denies  Access to the following DME (not in current use): None  Home Modifications: None    RELIGIOUS/SPIRITUAL AFFILIATION:  Patient identifies as: None  Impact on healthcare: No    ADVANCE DIRECTIVES:  Current AD/HCPOA: No   On file with : No  Desired healthcare agent(s): Hayley Moosman/wife     SUBSTANCE USE HISTORY:    Cigarette smoking:   Current: denies   Past: denies  Other tobacco/nicotine use:   Current: denies  Past: denies  Alcohol use:   Current: denies   Past: denies  Illicit drug use:   Current: denies   Past: denies    CAGE SCREENING TOOL   1. Have you ever felt you should cut down on your drinking? No   2. Have people annoyed you by criticizing your drinking? No   3. Have you ever felt bad or guilty about your drinking? No   4. Have you ever had a drink first thing in the morning to steady your nerves or to get rid of a hangover (eye-opener)? No     CHRONIC PAIN HISTORY:   Regular use of pain medications: denies  ??? Medications/Prescriber: N/A  Involvement with pain clinic:  denies    PSYCHIATRIC HISTORY:   Current issues: denies  Past issues: denies  Medications:    Current: denies    Past: denies  Therapy:    Current: denies   Past: denies  SI/HI:   Current: denies   Past: denies  Hospitalizations:   Current: denies   Past: denies  Loss/Trauma/Violence:   Current: denies   Past: denies  Mania:  No  Psychosis:  No    Hx of adverse reactions to treatment/medication/steroids: denies    They agreed to discuss any changes in the patient's mood or behavior with his medical team.    PHQ 2:   Over the last 2 weeks, how often have you been bothered by the following problems?   1. Little interest or pleasure in doing things: Not at all (0)   2. Feeling down, depressed or hopeless: Not at all (0)   TOTAL 0     GAD 2:   Over the last 2 weeks, how often have you been bothered by the following problems?   1. Feeling nervous, anxious or on edge Not at all (0)   2. Not being able to stop or control worrying: Not at all (0)   TOTAL 0     LEGAL HISTORY:   Derek Boyer denies open or pending legal issues. A review of [place] DPS Offender Public Information website revealed no open or pending charges or commitments. There is no history of incarceration, probation/parole or substance involved infractions (i.e., DUI, DWI, possession, trafficking). His Probation/Parole/Post Release Status is INACTIVE.    COPING STYLE:   Current Stressors: denies  Coping Skills/Mechanisms: alone time to process and then will talk about it   Stress of major surgery and other associated issues: n/a    PLAN FOR TRANSPLANTATION:   Discussed expectations for pre/post caregiving needs with patient and spouse.  Discussed the projected length of hospitalization, need for 24-hour supervision at the time of discharge and for at least the next 6 weeks, inability to drive for a 2-3 month period, need for lab work three times a week for the first two months after transplant and less often thereafter, need for weekly follow up at [facility] for the first month after transplant and less often thereafter, and the lifetime need for medication and compliance in all areas.     Discussed importance of having alternative/secondary caregivers in the event that primary support people are not available at the time of transplant.  Discussed that patients with any mobility issues prior to transplant could need more physical assistance after abdominal surgery and/or could be at a higher risk of becoming deconditioned more quickly.  They indicated that they understood.      Current Plan: [name] and [name] have a robust caregiving plan identified.  Their 48 yr old son, Gerri Spore attends 4 days half day day care and then maternal grandparents/Bill and Beth Dunn babysit him until [place] gets off of work. Other relatives have offered to assist in [facility] care if needed. Although Hayley will need to continue to work post transplant, she will remain his primary with several other relatives and friends in [place] and [place] that will fill in the gaps when the back-up caregiver is needed. Hayley's best friend, Ethelene Browns is a liver transplant recipient and has offered to assist as needed. Derek Boyer's entire family who resides in [place] are willing to be back-up caregivers when needed.     Primary Support: Hayley  Relationship to patient: wife   Age/DOB: 65  Education Level: Pharm D    Employment: FT as a Teacher, early years/pre at [employer]: stable   Support limitations: employment and other caregiving responsibilities  Support strengths: historical caregiver, FMLA eligible, accrued paid leave, health literate, valid driver's license, access to reliable vehicle , comfortable driving to [facility] and lives in the home      TRAVEL TIME TO HOSPITAL:   Approximately 1'via car to [facility] [address]    MENTAL STATUS:    Affect: unable to assess via phone  Appearance: unable to assess via phone  Attention Span: normal attention span  Attitude: friendly, cooperative, attentive  Behavior: unable to assess  Insight & Judgment: intact/appropriate, reliable insight  Level of Consciousness: alert  Mood: euthymic/normal/stable  Orientation: person, place, time, date  Speech: normal speech  Thought Content: logical connections    EDUCATION:   Verbal education on the following topics provided to patient and spouse :  ??? Advance Care Planning  ??? Fundraising for Transplant-related expenses  ??? Long-term financial and vocational planning  ??? Expectations for support planning both pre- and post-transplant  ??? Common experience for liver transplant patients to have depression and/or anxiety   ??? Transplant social worker availability throughout transplant process    Written education or resource material provided on the following topics:  ??? Engineer, manufacturing systems Forms  ??? [employer] and other financial resources  ??? Social Engineer, petroleum (SSDI) and SSDI Resources    Derek Boyer and Chelsea/ spouse indicated that they understood all education provided and did ask appropriate questions.     COLLATERAL CONTACT:   Hayley/wife was present for the duration of interview and validated all information, including post-transplant support plan.     ASSESSMENT AND RECOMMENDATIONS:   Derek Boyer is a 40 y.o. male with a PMHx of T2DM, obesity, HTN and NASH cirrhosis c/b non-bleeding G1EVs, HE, and ascites that presents for follow-up of NASH cirrhosis. NASH Cirrhosis: Complicated by non-bleeding Grade I EVs, hepatic encephalopathy, and ascites/BLE edema. Today, he appears euvolemic on exam and reports good control of his volume status with his low-dose diuretic regimen. Major issue from a liver perspective today is hepatic encephalopathy reported by both the patient and his wife. He is relatively clear today and without asterixis, but he and his wife both report symptoms tend to be worse in the afternoon. He is at goal with bowel movements on lactulose and as such we will add rifaximin today. Labs will be updated and will order MRI for May to monitor LR-3 lesion seen on prior exam.   He presents today as part of his transplant assessment.     Patient has a number of psychosocial strengths, including motivation for transplant, absence of psychopathology, adherence to medical treatment, adequate financial  resources , denial of substance abuse concerns and appropriate caregiving plan.    Derek Boyer is a very stable psychosocial candidate for liver transplant. There are no issues with housing, insurance, income, mental illness or substance abuse.     [name] and Outlook have a robust caregiving plan identified. Their 34 yr old son, Gerri Spore attends 4 days half day day care and then maternal grandparents/Bill and Beth Dunn babysit him until [place] gets off of work. Other relatives have offered to assist in [facility] care if needed. Although Hayley will need to continue to work post transplant, she will remain his primary with several other relatives and friends in [place] and [place] that will fill in the gaps when the back-up caregiver is needed. Hayley's best friend, Ethelene Browns is a liver transplant recipient and has offered to assist as needed. Jazir's entire family who resides in [place] are willing to be back-up caregivers when needed.     SIPAT SCORE: 2; excellent candidate     At this time, this CSW cannot identify any psychosocial barriers that would impact consideration transplant.    Recommendations and Plan:   1. Appeal SSDI   2. Consder HC POA  3. Consider fundraising   Follow up at next assessment regarding the following issues:  1. See above     Final decision regarding listing status is based upon committee review at selection meeting.    Flint Melter, LCSW  Transplant Case Manager  [facility]  Completed:  07/22/21

## 2021-07-21 NOTE — Unmapped (Signed)
TFC spoke with patient, Derek Boyer, to discuss insurance benefits related to Liver transplant. Derek Boyer verbalized understanding of financial agreement for transplant.    As part of this consultation Derek Boyer was advised to:  Consider fundraising to help with transplant expenses

## 2021-07-22 ENCOUNTER — Ambulatory Visit: Admit: 2021-07-22 | Discharge: 2021-07-23 | Payer: PRIVATE HEALTH INSURANCE

## 2021-07-22 DIAGNOSIS — R161 Splenomegaly, not elsewhere classified: Secondary | ICD-10-CM | POA: Diagnosis not present

## 2021-07-22 DIAGNOSIS — K7290 Hepatic failure, unspecified without coma: Secondary | ICD-10-CM | POA: Diagnosis not present

## 2021-07-22 DIAGNOSIS — I8500 Esophageal varices without bleeding: Secondary | ICD-10-CM | POA: Diagnosis not present

## 2021-07-22 DIAGNOSIS — K7689 Other specified diseases of liver: Secondary | ICD-10-CM | POA: Diagnosis not present

## 2021-07-22 DIAGNOSIS — K7210 Chronic hepatic failure without coma: Secondary | ICD-10-CM | POA: Diagnosis not present

## 2021-07-22 DIAGNOSIS — K766 Portal hypertension: Secondary | ICD-10-CM | POA: Diagnosis not present

## 2021-07-22 DIAGNOSIS — I081 Rheumatic disorders of both mitral and tricuspid valves: Secondary | ICD-10-CM | POA: Diagnosis not present

## 2021-07-22 DIAGNOSIS — I868 Varicose veins of other specified sites: Secondary | ICD-10-CM | POA: Diagnosis not present

## 2021-07-22 MED ADMIN — metoprolol (LOPRESSOR) injection 5 mg: 5 mg | INTRAVENOUS | @ 17:00:00 | Stop: 2021-07-22

## 2021-07-22 MED ADMIN — nitroglycerin (NITROLINGUAL) 0.4 mg/dose spray 2 spray: 2 | SUBLINGUAL | @ 17:00:00 | Stop: 2021-07-22

## 2021-07-22 MED ADMIN — iohexoL (OMNIPAQUE) 350 mg iodine/mL solution 75 mL: 75 mL | INTRAVENOUS | @ 17:00:00 | Stop: 2021-07-22

## 2021-07-22 MED ADMIN — gadobenate dimeglumine (MULTIHANCE) 529 mg/mL (0.1mmol/0.2mL) solution 10 mL: 10 mL | INTRAVENOUS | @ 19:00:00 | Stop: 2021-07-22

## 2021-07-22 NOTE — Unmapped (Addendum)
[facility] Refill Coordination Note    **Patient is requesting 90 day supply again, since deductible is met**    Specialty Medication(s) to be Shipped:   Infectious Disease: Xifaxan    Other medication(s) to be shipped: No additional medications requested for fill at this time     Derek Boyer, DOB: 12/11/1980  Phone: [phone] (home)       All above HIPAA information was verified with patient.     Was a Nurse, learning disability used for this call? No    Completed refill call assessment today to schedule patient's medication shipment from the [facility] [phone]).  All relevant notes have been reviewed.     Specialty medication(s) and dose(s) confirmed: Regimen is correct and unchanged.   Changes to medications: Derek Boyer reports no changes at this time.  Changes to insurance: No  New side effects reported not previously addressed with a pharmacist or physician: None reported  Questions for the pharmacist: No    Confirmed patient received a Conservation officer, historic buildings and a Surveyor, mining with first shipment. The patient will receive a drug information handout for each medication shipped and additional FDA Medication Guides as required.       DISEASE/MEDICATION-SPECIFIC INFORMATION        N/A    SPECIALTY MEDICATION ADHERENCE     Medication Adherence    Patient reported X missed doses in the last month: 0  Specialty Medication: XIFAXAN  Patient is on additional specialty medications: No  Patient is on more than two specialty medications: No  Any gaps in refill history greater than 2 weeks in the last 3 months: no  Demonstrates understanding of importance of adherence: yes  Informant: patient  Reliability of informant: reliable  Confirmed plan for next specialty medication refill: delivery by pharmacy  Refills needed for supportive medications: not needed              Were doses missed due to medication being on hold? No    XIFAXAN 550 mg: 8 days of medicine on hand       REFERRAL TO PHARMACIST     Referral to the pharmacist: Not needed      [facility]     Shipping address confirmed in Epic.     Delivery Scheduled: Yes, Expected medication delivery date: 07/28/21.     Medication will be delivered via UPS to the prescription address in Epic WAM.    Derek Boyer   [facility] Specialty Technician

## 2021-07-25 ENCOUNTER — Ambulatory Visit: Admit: 2021-07-25 | Discharge: 2021-07-25 | Payer: PRIVATE HEALTH INSURANCE

## 2021-07-25 ENCOUNTER — Encounter
Admit: 2021-07-25 | Discharge: 2021-07-25 | Payer: PRIVATE HEALTH INSURANCE | Attending: Gastroenterology | Primary: Gastroenterology

## 2021-07-25 ENCOUNTER — Ambulatory Visit
Admit: 2021-07-25 | Discharge: 2021-07-26 | Payer: PRIVATE HEALTH INSURANCE | Attending: Nutritionist | Primary: Nutritionist

## 2021-07-25 DIAGNOSIS — Z01818 Encounter for other preprocedural examination: Principal | ICD-10-CM

## 2021-07-25 DIAGNOSIS — Z7682 Awaiting organ transplant status: Principal | ICD-10-CM

## 2021-07-25 DIAGNOSIS — R188 Other ascites: Principal | ICD-10-CM

## 2021-07-25 DIAGNOSIS — E119 Type 2 diabetes mellitus without complications: Principal | ICD-10-CM

## 2021-07-25 DIAGNOSIS — K7210 Chronic hepatic failure without coma: Principal | ICD-10-CM

## 2021-07-25 DIAGNOSIS — R140 Abdominal distension (gaseous): Secondary | ICD-10-CM | POA: Diagnosis not present

## 2021-07-25 DIAGNOSIS — B1920 Unspecified viral hepatitis C without hepatic coma: Secondary | ICD-10-CM | POA: Diagnosis not present

## 2021-07-25 DIAGNOSIS — Z7984 Long term (current) use of oral hypoglycemic drugs: Secondary | ICD-10-CM | POA: Diagnosis not present

## 2021-07-25 DIAGNOSIS — E669 Obesity, unspecified: Secondary | ICD-10-CM | POA: Diagnosis not present

## 2021-07-25 DIAGNOSIS — Z6833 Body mass index (BMI) 33.0-33.9, adult: Secondary | ICD-10-CM | POA: Diagnosis not present

## 2021-07-25 DIAGNOSIS — R161 Splenomegaly, not elsewhere classified: Secondary | ICD-10-CM | POA: Diagnosis not present

## 2021-07-25 DIAGNOSIS — I10 Essential (primary) hypertension: Secondary | ICD-10-CM | POA: Diagnosis not present

## 2021-07-25 DIAGNOSIS — K7689 Other specified diseases of liver: Secondary | ICD-10-CM | POA: Diagnosis not present

## 2021-07-25 DIAGNOSIS — I8500 Esophageal varices without bleeding: Secondary | ICD-10-CM | POA: Diagnosis not present

## 2021-07-25 LAB — CBC W/ AUTO DIFF
BASOPHILS ABSOLUTE COUNT: 0 10*9/L (ref 0.0–0.1)
BASOPHILS RELATIVE PERCENT: 0.6 %
EOSINOPHILS ABSOLUTE COUNT: 0.1 10*9/L (ref 0.0–0.5)
EOSINOPHILS RELATIVE PERCENT: 2.9 %
HEMATOCRIT: 43.5 % (ref 39.0–48.0)
HEMOGLOBIN: 14.9 g/dL (ref 12.9–16.5)
LYMPHOCYTES ABSOLUTE COUNT: 1 10*9/L — ABNORMAL LOW (ref 1.1–3.6)
LYMPHOCYTES RELATIVE PERCENT: 20.4 %
MEAN CORPUSCULAR HEMOGLOBIN CONC: 34.2 g/dL (ref 32.0–36.0)
MEAN CORPUSCULAR HEMOGLOBIN: 33.4 pg — ABNORMAL HIGH (ref 25.9–32.4)
MEAN CORPUSCULAR VOLUME: 97.5 fL — ABNORMAL HIGH (ref 77.6–95.7)
MEAN PLATELET VOLUME: 11.7 fL — ABNORMAL HIGH (ref 6.8–10.7)
MONOCYTES ABSOLUTE COUNT: 0.5 10*9/L (ref 0.3–0.8)
MONOCYTES RELATIVE PERCENT: 10.6 %
NEUTROPHILS ABSOLUTE COUNT: 3.1 10*9/L (ref 1.8–7.8)
NEUTROPHILS RELATIVE PERCENT: 65.5 %
PLATELET COUNT: 115 10*9/L — ABNORMAL LOW (ref 150–450)
RED BLOOD CELL COUNT: 4.47 10*12/L (ref 4.26–5.60)
RED CELL DISTRIBUTION WIDTH: 16.8 % — ABNORMAL HIGH (ref 12.2–15.2)
WBC ADJUSTED: 4.8 10*9/L (ref 3.6–11.2)

## 2021-07-25 LAB — COMPREHENSIVE METABOLIC PANEL
ALBUMIN: 2.8 g/dL — ABNORMAL LOW (ref 3.4–5.0)
ALKALINE PHOSPHATASE: 229 U/L — ABNORMAL HIGH (ref 46–116)
ALT (SGPT): 83 U/L — ABNORMAL HIGH (ref 10–49)
ANION GAP: 8 mmol/L (ref 5–14)
AST (SGOT): 188 U/L — ABNORMAL HIGH (ref <=34)
BILIRUBIN TOTAL: 8.1 mg/dL — ABNORMAL HIGH (ref 0.3–1.2)
BLOOD UREA NITROGEN: 17 mg/dL (ref 9–23)
BUN / CREAT RATIO: 20
CALCIUM: 9.3 mg/dL (ref 8.7–10.4)
CHLORIDE: 105 mmol/L (ref 98–107)
CO2: 24 mmol/L (ref 20.0–31.0)
CREATININE: 0.83 mg/dL
EGFR CKD-EPI (2021) MALE: >90 mL/min/{1.73_m2} (ref >=60)
GLUCOSE RANDOM: 108 mg/dL — ABNORMAL HIGH (ref 70–99)
POTASSIUM: 4.2 mmol/L (ref 3.4–4.8)
PROTEIN TOTAL: 7.3 g/dL (ref 5.7–8.2)
SODIUM: 137 mmol/L (ref 135–145)

## 2021-07-25 LAB — VITAMIN D 25 HYDROXY: VITAMIN D, TOTAL (25OH): 29.6 ng/mL (ref 20.0–80.0)

## 2021-07-25 LAB — HEMOGLOBIN A1C
ESTIMATED AVERAGE GLUCOSE: 91 mg/dL
HEMOGLOBIN A1C: 4.8 % (ref 4.8–5.6)

## 2021-07-25 LAB — PROTIME-INR
INR: 1.46
PROTIME: 16.8 s — ABNORMAL HIGH (ref 9.8–12.8)

## 2021-07-25 LAB — TRANSPLANT IMMUNE STATUS - EBV: EPSTEIN-BARR VCA IGG ANTIBODY: POSITIVE — AB

## 2021-07-25 LAB — BILIRUBIN, DIRECT: BILIRUBIN DIRECT: 6.1 mg/dL — ABNORMAL HIGH (ref 0.00–0.30)

## 2021-07-25 LAB — APTT
APTT: 32.8 s (ref 25.1–36.5)
HEPARIN CORRELATION: 0.2

## 2021-07-25 LAB — HSV ANTIBODIES, IGG
HERPES SIMPLEX VIRUS 1 IGG: NEGATIVE
HERPES SIMPLEX VIRUS 2 IGG: NEGATIVE

## 2021-07-25 LAB — PHOSPHORUS: PHOSPHORUS: 2.7 mg/dL (ref 2.4–5.1)

## 2021-07-25 LAB — AFP TUMOR MARKER: AFP-TUMOR MARKER: 2 ng/mL (ref <=8)

## 2021-07-25 LAB — IRON & TIBC
IRON SATURATION: 45 % (ref 20–55)
IRON: 107 ug/dL
TOTAL IRON BINDING CAPACITY: 240 ug/dL — ABNORMAL LOW (ref 250–425)

## 2021-07-25 LAB — RUBELLA ANTIBODY, IGG: RUBELLA IGG SCREEN: POSITIVE

## 2021-07-25 LAB — VARICELLA ZOSTER ANTIBODY, IGG: VARICELLA ZOSTER IGG: POSITIVE

## 2021-07-25 LAB — MAGNESIUM: MAGNESIUM: 1.6 mg/dL (ref 1.6–2.6)

## 2021-07-25 LAB — TOXOPLASMA GONDII ANTIBODY, IGG: TOXOPLASMA GONDII IGG: NEGATIVE

## 2021-07-25 LAB — FERRITIN: FERRITIN: 535.7 ng/mL — ABNORMAL HIGH

## 2021-07-25 LAB — CMV IGG: CMV IGG: NEGATIVE

## 2021-07-25 NOTE — Unmapped (Signed)
Urine was collected and sent to the lab.

## 2021-07-25 NOTE — Unmapped (Signed)
Transplant Surgery History and Physical      Assessment/Recommendations:    Derek Boyer is a 40 y.o. male seen in consultation at the request of Pcp, None Per Patient for evaluation of candidacy for transplantation.    I spent 20 minutes with the patient obtaining the above history and physical examination, and greater than 50% of the time was spent counseling and on the substance of the discussion.    Today we discussed liver transplantation going over the surgery to be performed, the hospital course including length of stay, anti-rejection medications and their side effects, results and the cadaveric donor system.    I discussed in detail with Derek Boyer the risks and benefits of liver transplantation, including but not limited to: the general anesthetic, monitoring lines, the incision, the hepatectomy, as well as reimplantation of the liver graft and immunosuppressant medications. In regards to the surgical procedure, we noted that it is a major operation performed under general anesthesia with the risks of heart attack, stroke and death. Multiple invasive means of monitoring may be necessary during the operation including an arterial line, a central venous catheter, a foley catheter inserted into the bladder, and a tube from your nose into your stomach to prevent stomach distension. After surgery, the patient will go to the Intensive Care Unit and is then sent to the regular floor when medically stable. I discussed the possible complications including the need for reoperation for bleeding, infection or other complications, the possibility of clotting/leakage of blood vessels, requiring either radiological intervention, surgical intervention, or even retransplantation. I reviewed the possibility of complications involving the biliary tract including leaks, strictures and need for retransplantation for biliary complications. I discussed the possibility of primary nonfunction of the liver graft requiring urgent retransplantation or the result of death. The patient understands the need for long-term immunosuppression therapy as well as monitoring of labs and immunosuppression. Anti-rejection medications, including Prograf or Cyclosporine (Neoral), Cellcept, steroids and others, will be needed after transplantation and for the patient???s entire lifetime. Problems include infection, cancer, hirsutism, tremors, gum swelling, hypertension, bone fractures, aggravation of diabetes or new onset diabetes, cataracts, and rashes. Finally, the donor system was reviewed. All donors are tested for infections and other diseases, but there is a small chance of transmission of diseases including viruses as well as the possible transmission of tumors. Some patients may elect to receive a liver from a donor who was exposed to the Hepatitis B or Hepatitis C virus and the recipient may require certain anti-viral medications to prevent this virus from damaging the new liver.    Finally, I reviewed with the patient how the surgery is expected to improve their health and quality of life, that the average length of hospitalization stay is 10-12 days, and that the length of their expected recovery period, including when normal daily activities may be resumed, will be patient dependent.    Derek Boyer had all their questions answered and wishes to proceed with the liver transplant evaluation process.    This patient was seen and evaluated with Dr. Celine Mans with no contraindication for transplant from a surgical perspective.      HPI  40 year old male with a history of NASH cirrhosis and type 2 diabetes mellitus who presents for liver transplantation evaluation.  The patient was initially diagnosed with NASH cirrhosis in 2018 while undergoing a laparoscopic cholecystectomy.  Since then he has had a progression of his liver disease.  He has grade 1 non-bleeding esophageal varices and abdominal ascites  which has been managed medically with spironolactone and Lasix.    On August 29 he was seen in the emergency department for a few days of abdominal distention, weight gain, and fatigue.  Work-up was notable for a liver Doppler showing heterogeneous nodular liver along with splenomegaly and small volume ascites.  They attempted to do a diagnostic and therapeutic paracentesis, however they were unable to safely evacuate the fluid due to bowel in close proximity. He has otherwise not undergone a paracentesis in the past.    The patient mentions having intermittent hepatic encephalopathy. He takes lactulose and rifaximin for this but mentions that his hepatic encephalopathy is gradually worsening. During his episodes he has issues with concentration and becomes more irritable.     MELD-Na score: 19 at 07/25/2021 12:11 PM  MELD score: 19 at 07/25/2021 12:11 PM  Calculated from:  Serum Creatinine: 0.83 mg/dL (Using min of 1 mg/dL) at 1/61/0960 45:40 PM  Serum Sodium: 137 mmol/L at 07/25/2021 12:11 PM  Total Bilirubin: 8.1 mg/dL at 9/81/1914 78:29 PM  INR(ratio): 1.46 at 07/25/2021 12:11 PM  Age: 52 years    Allergies    Other      Medications      Current Outpatient Medications   Medication Sig Dispense Refill   ??? baclofen (LIORESAL) 10 MG tablet Take 2 tablets (20 mg total) by mouth nightly. 60 tablet 5   ??? furosemide (LASIX) 20 MG tablet Take 1 tablet (20 mg total) by mouth daily. 90 tablet 3   ??? lactulose (CHRONULAC) 10 gram/15 mL solution Take 30 mL (20 g total) by mouth Three (3) times a day. 2700 mL 5   ??? metFORMIN (GLUCOPHAGE-XR) 500 MG 24 hr tablet Take 500 mg by mouth daily with evening meal.      ??? multivitamin/iron/folic acid (CENTRUM ORAL)      ??? omeprazole (PRILOSEC) 20 MG capsule Take 1 capsule (20 mg total) by mouth every morning before breakfast. 90 capsule 1   ??? rifAXIMin (XIFAXAN) 550 mg Tab Take 1 tablet (550 mg total) by mouth Two (2) times a day. 180 tablet 3   ??? spironolactone (ALDACTONE) 50 MG tablet Take 1 tablet (50 mg total) by mouth daily. 90 tablet 3 No current facility-administered medications for this visit.         Past Medical History    Past Medical History:   Diagnosis Date   ??? Diabetes mellitus (CMS-HCC)          Past Surgical History    Past Surgical History:   Procedure Laterality Date   ??? PR UPPER GI ENDOSCOPY,DIAGNOSIS N/A 09/10/2020    Procedure: UGI ENDO, INCLUDE ESOPHAGUS, STOMACH, & DUODENUM &/OR JEJUNUM; DX W/WO COLLECTION SPECIMN, BY BRUSH OR WASH;  Surgeon: Janyth Pupa, MD;  [facility];  Service: Gastroenterology         Family History    The patient's family history is not on file..      Social History:    Tobacco use: denies  Alcohol use: denies  Drug use: denies      Review of Systems    A 12 system review of systems was negative except as noted in HPI    Objective     PE: Height 170.2 cm (5' 7). Body mass index is 33.2 kg/m??.  General: In no apparent distress   Lungs: clear to auscultation, percussion to the bases, and unlabored breathing  Heart: euvolemic, regular rate and rhythm, normal S1 and S2,  no murmur  Abd: Soft, nontender, slightly distended.  No obvious organomegaly.   Ascites: mild   Skin: no rashes, jaundice or skin lesions noted  Ext: no edema, well perfused  Neuro: non-focal exam. thought organized, appropriate affect, normal fluent speech        Test Results    Labs:  All lab results last 24 hours:    Recent Results (from the past 24 hour(s))   Type and Screen    Collection Time: 07/25/21 12:09 PM   Result Value Ref Range    ABO Grouping A POS     Antibody Screen NEG    PTT    Collection Time: 07/25/21 12:11 PM   Result Value Ref Range    APTT 32.8 25.1 - 36.5 sec    Heparin Correlation 0.2    Protime-INR    Collection Time: 07/25/21 12:11 PM   Result Value Ref Range    PT 16.8 (H) 9.8 - 12.8 sec    INR 1.46    Magnesium Level    Collection Time: 07/25/21 12:11 PM   Result Value Ref Range    Magnesium 1.6 1.6 - 2.6 mg/dL   Hemoglobin Z6X    Collection Time: 07/25/21 12:11 PM   Result Value Ref Range    Hemoglobin A1C 4.8 4.8 - 5.6 %    Estimated Average Glucose 91 mg/dL   Ferritin    Collection Time: 07/25/21 12:11 PM   Result Value Ref Range    Ferritin 535.7 (H) 10.5 - 307.3 ng/mL   ABO UNOS    Collection Time: 07/25/21 12:11 PM   Result Value Ref Range    Blood Type UNOS A POS    Iron Panel    Collection Time: 07/25/21 12:11 PM   Result Value Ref Range    Iron 107 65 - 175 ug/dL    TIBC 096 (L) 045 - 409 ug/dL    Iron Saturation (%) 45 20 - 55 %   Bilirubin, Direct    Collection Time: 07/25/21 12:11 PM   Result Value Ref Range    Bilirubin, Direct 6.10 (H) 0.00 - 0.30 mg/dL   Phosphorus Level    Collection Time: 07/25/21 12:11 PM   Result Value Ref Range    Phosphorus 2.7 2.4 - 5.1 mg/dL   Comprehensive Metabolic Panel    Collection Time: 07/25/21 12:11 PM   Result Value Ref Range    Sodium 137 135 - 145 mmol/L    Potassium 4.2 3.4 - 4.8 mmol/L    Chloride 105 98 - 107 mmol/L    CO2 24.0 20.0 - 31.0 mmol/L    Anion Gap 8 5 - 14 mmol/L    BUN 17 9 - 23 mg/dL    Creatinine 8.11 9.14 - 1.10 mg/dL    BUN/Creatinine Ratio 20     eGFR CKD-EPI (2021) Male >90 >=60 mL/min/1.71m2    Glucose 108 (H) 70 - 99 mg/dL    Calcium 9.3 8.7 - 78.2 mg/dL    Albumin 2.8 (L) 3.4 - 5.0 g/dL    Total Protein 7.3 5.7 - 8.2 g/dL    Total Bilirubin 8.1 (H) 0.3 - 1.2 mg/dL    AST 956 (H) <=21 U/L    ALT 83 (H) 10 - 49 U/L    Alkaline Phosphatase 229 (H) 46 - 116 U/L   CBC w/ Differential    Collection Time: 07/25/21 12:11 PM   Result Value Ref Range    WBC 4.8 3.6 - 11.2  10*9/L    RBC 4.47 4.26 - 5.60 10*12/L    HGB 14.9 12.9 - 16.5 g/dL    HCT 96.2 95.2 - 84.1 %    MCV 97.5 (H) 77.6 - 95.7 fL    MCH 33.4 (H) 25.9 - 32.4 pg    MCHC 34.2 32.0 - 36.0 g/dL    RDW 32.4 (H) 40.1 - 15.2 %    MPV      Platelet      Anisocytosis Slight (A) Not Present       Imaging:     Liver Ultrasound 07/04/2021    IMPRESSION:  -Patent hepatic vasculature with normal flow direction.  -Heterogeneous, nodular liver consistent with cirrhosis.  -Sequela of portal hypertension including splenomegaly and small volumes of ascites.

## 2021-07-25 NOTE — Unmapped (Signed)
[facility] Outpatient Nutrition Services   Medical Nutrition Therapy Consultation       Visit Type:    Initial Assessment    Referral Reason: :  Liver Transplant Evaluation       [name] is a 40 y.o. male seen for medical nutrition therapy for liver transplant evalutaion. His active problem list, medication list and allergies were reviewed.     His interim medical history is significant for T2DM, obesity, HTN and NASH cirrhosis c/b non-bleeding G1EVs, HE, and ascites.    Anthropometrics   Estimated body mass index is 33.83 kg/m?? as calculated from the following:    Height as of an earlier encounter on 07/25/21: 170.2 cm (5' 7).    Weight as of an earlier encounter on 07/25/21: 98 kg (216 lb).    Wt Readings from Last 5 Encounters:   07/25/21 98 kg (216 lb)   06/06/21 96.2 kg (212 lb)   03/07/21 94.8 kg (209 lb)   12/09/20 94.3 kg (207 lb 12.8 oz)   09/10/20 88.9 kg (196 lb)        Usual body weight: 245- 250 lbs 2 years ago ; lost down to 190 lbs in November 2022 - has been able to get back to ~207 lbs with good diet and strength training   Ideal Body Weight:  67.2 kg   Dry weight: 207 lbs       Nutrition Risk Screening:     Nutrition Focused Physical Exam:  Nutrition Focused Physical Exam:  Fat Areas Examined  Orbital: Mild loss  Upper Arm: Mild loss      Muscle Areas Examined  Temple: Mild loss     Functional Status  Hand Grip Strength: Within Normal Limits    Nutrition Evaluation  Nutrition Designation: Obese class I  (BMI 30.00 - 34.99 kg/m2) (07/27/21 1344)       [facility] Marita Snellen Index    Gender: Male     Dominant Hand Grip Strength:   Attempt 1: 53.2   Attempt 2: 46.1   Attempt 3: 44.6   Average: 47.97    Time to Do 5 Chair stands:  10.36   Secs    Seconds Holding 3 positions:   Side by Side: 10   SemiTandem: 10   Tandem: 10    [email or web address]      This patient's frailty index score is 3.12  which puts the patient  in the 18th  percentile for frailty among patients with cirrhosis who are listed for transplant.  He is considered ROBUST.     Malnutrition Screening:   Malnutrition Assessment using AND/ASPEN Clinical Characteristics:  Patient does not meet AND/ASPEN criteria for malnutrition at this time (07/27/21 1345)       Biochemical Data, Medical Tests and Procedures:  All pertinent labs and imaging reviewed by Lanelle Bal at 10:00 AM 07/27/2021.    Lab Results   Component Value Date    Hemoglobin A1C 4.8 07/25/2021    Hemoglobin A1C 4.7 (L) 09/06/2020      No results found for: VITAMINA  Lab Results   Component Value Date    Vitamin D Total (25OH) 29.6 07/25/2021    Vitamin D Total (25OH) 24.6 12/09/2020     No results found for: VITAME  No results found for: CRP  No results found for: ZINC  No results found for: COPPER    Lab Results   Component Value Date    BUN 17 07/25/2021    CREATININE 0.83 07/25/2021  NA 137 07/25/2021    K 4.2 07/25/2021    CL 105 07/25/2021    CO2 24.0 07/25/2021    CALCIUM 9.3 07/25/2021    PHOS 2.7 07/25/2021    ALBUMIN 2.8 (L) 07/25/2021       Medications and Vitamin/Mineral Supplementation:   All nutritionally pertinent medications reviewed on 07/27/2021.   Nutritionally pertinent medications include: lasix, lactulose, metformin, omeprazole, rifaximin, spironolactone   He is not taking nutrition supplements.     Current Outpatient Medications   Medication Sig Dispense Refill   ??? baclofen (LIORESAL) 10 MG tablet Take 2 tablets (20 mg total) by mouth nightly. 60 tablet 5   ??? furosemide (LASIX) 20 MG tablet Take 1 tablet (20 mg total) by mouth daily. 90 tablet 3   ??? lactulose (CHRONULAC) 10 gram/15 mL solution Take 30 mL (20 g total) by mouth Three (3) times a day. 2700 mL 5   ??? metFORMIN (GLUCOPHAGE-XR) 500 MG 24 hr tablet Take 500 mg by mouth daily with evening meal.      ??? multivitamin/iron/folic acid (CENTRUM ORAL)      ??? omeprazole (PRILOSEC) 20 MG capsule Take 1 capsule (20 mg total) by mouth every morning before breakfast. 90 capsule 1   ??? rifAXIMin (XIFAXAN) 550 mg Tab Take 1 tablet (550 mg total) by mouth Two (2) times a day. 180 tablet 3   ??? spironolactone (ALDACTONE) 50 MG tablet Take 1 tablet (50 mg total) by mouth daily. 90 tablet 3     No current facility-administered medications for this visit.       Nutrition History:     Dietary Restrictions: No known food allergies or food intolerances.     Gastrointestinal Issues: Bowel movements per day: more than 4 per day.  Patient reports having a BM within 30 minutes of eating. Patient reports most stools are formed. Patient reports having some oil stools ~1x per day - usually later in the day. Stools in the morning are usually more formed.    Hunger and Satiety: Poor appetite and limited intake.   Early satiety. Patient reports he doesn't feel hungry and has to force himself to eat most times.     Food Safety and Access: No to little issues noted.     Diet Recall:   Time Intake   Breakfast Eggs and toast    Snack (AM) Premier Protein    Lunch Forgets to eat most times - not feeling hungry    Snack (PM) Premier Protein (if wife reminds him)   Civil engineer, contracting + starch (sometimes veggie)   Snack (HS) Premier Protein      Food-Related History:  Beverages:  Premier Protein 2-3x per day   Dining Out:  Minimal since trying to limit sodium   Cooking Methods: toasting, microwave, oven   Usual Food Choices: not a fruit and veggie person, dislikes cheese      Physical Activity:  Physical activity level is light with some exercise.  Was going to the gym 2-3x per week - usually weight based exercises. Over the past 3 weeks has not felt well enough to go to the gym     Daily Estimated Nutrient Needs:  Energy: 1680- 2015 kcals [25-30 kcal/kg using ideal body weight, 67.2 kg (07/27/21 1355)]  Protein: 80- 100 gm [1.2-1.5 gm/kg using ideal body weight, 67.2 kg (07/27/21 1355)]  Fluid:   [per MD team]  Sodium:  <2000 mg     Nutrition Goals & Evaluation  1. Meet estimated daily needs (New)  2. Normal vitamin levels (New)  3. Balanced macronutrient intake (New)  4. Basic understanding of post-transplant diet (New)  5. Hemoglobin A1c <7% (New)    Nutrition goals reviewed, and relevant barriers identified and addressed: none evident. He is evaluated to have good willingness and ability to achieve nutrition goals.     Nutrition Assessment     Per the patient's diet recall, nutrition intake is fair but likely does not meet estimated nutrition needs. Patient's intake of high protein supplements is appropriate for liver disease and transplant. Discussed importance of increasing calories in diet to meet needs. Discussed adding healthy calories to Premier Protein or switching to a higher calorie shake. Patient was amenable to trying to add liquid calories as remains low and continues to worsen with time. Patient does not meet malnutrition criteria at this time. Per frailty score, pt is considered robust and completed all activities with ease.     Transplant Nutrition Assessment:    The patient has the following nutrition concerns for transplant listing: none.  There are no absolute contraindication for transplant listing. Patient will be discussed with the team during the selection conference.       Nutrition Intervention      Nutrition Education: transplant goals and expectations, cirrhosis medical nutrition therapy     Materials Provided were: Handout explaining prescribed diet    Nutrition Plan:      ??? Adhere to low sodium (<2000 milligram) and high protein (~90 gram) diet   ??? Increase calories to ~1800 per day  ??? Monitor for further oily or greasy stools   ??? Increase exercise to 30 minutes, 5 days per week    ??? Check fat soluble vitamin levels and CRP - vitamin D already came back normal     Follow up will occur in annually.     Anthropometric measurements and Biochemical data, medical tests, procedures will be assessed at time of follow-up.         Recommendations for Care Team:  Monitor stools and fat soluble vitamin levels       Time spent 46 minutes       Lanelle Bal, RD, LDN  Abdominal Transplant Dietitian   Pager: [phone]

## 2021-07-26 LAB — TB NIL: TB NIL VALUE: 0.01

## 2021-07-26 LAB — HEPATITIS A IGG: HEPATITIS A IGG: REACTIVE — AB

## 2021-07-26 LAB — QUANTIFERON TB GOLD PLUS
QUANTIFERON ANTIGEN 1 MINUS NIL: 0.05 [IU]/mL
QUANTIFERON ANTIGEN 2 MINUS NIL: 0.03 [IU]/mL
QUANTIFERON MITOGEN: 2.2 [IU]/mL
QUANTIFERON TB GOLD PLUS: NEGATIVE
QUANTIFERON TB NIL VALUE: 0.01 [IU]/mL

## 2021-07-26 LAB — TB MITOGEN: TB MITOGEN VALUE: 2.21

## 2021-07-26 LAB — TB AG1: TB AG1 VALUE: 0.06

## 2021-07-26 LAB — SYPHILIS SCREEN: SYPHILIS RPR SCREEN: NONREACTIVE

## 2021-07-26 LAB — HEPATITIS B SURFACE ANTIGEN: HEPATITIS B SURFACE ANTIGEN: NONREACTIVE

## 2021-07-26 LAB — TB AG2: TB AG2 VALUE: 0.04

## 2021-07-26 LAB — HIV ANTIGEN/ANTIBODY COMBO: HIV ANTIGEN/ANTIBODY COMBO: NONREACTIVE

## 2021-07-27 MED FILL — XIFAXAN 550 MG TABLET: ORAL | 90 days supply | Qty: 180 | Fill #3

## 2021-07-27 NOTE — Unmapped (Signed)
Pharmacy Pre-transplant Evaluation/ Selection Committee Note    Evaluation for liver transplant    Patient:  Derek Boyer, Age:  40 y.o. old, Gender:  male    Allergies:  Other    Immunization history:    Immunization History   Administered Date(s) Administered   ??? COVID-19 VACC,MRNA,(PFIZER)(PF)(IM) 11/25/2019, 12/17/2019, 08/03/2020   ??? Hep A / Hep B 09/02/2018, 09/09/2018, 09/30/2018, 09/03/2019   ??? INFLUENZA INJ MDCK PF, QUAD,(FLUCELVAX)(38MO AND UP EGG FREE) 08/11/2017   ??? INFLUENZA TIV (TRI) 38MO+ W/ PRESERV (IM) 09/08/2015   ??? Influenza Nasal, Unspecified Formulation 08/11/2017, 07/24/2019   ??? Influenza Vaccine Quad (IIV4 PF) 43mo+ injectable 08/19/2018   ??? PNEUMOCOCCAL POLYSACCHARIDE 23 08/25/2019   ??? Pneumococcal Conjugate 13-Valent 12/09/2020   ??? TdaP 09/05/2020       Alcohol, tobacco, illicit drug use history:  Social History     Substance and Sexual Activity   Alcohol Use Never     Social History     Tobacco Use   Smoking Status Never Smoker   Smokeless Tobacco Never Used     Social History     Substance and Sexual Activity   Drug Use Never       Home medications:  Current Outpatient Medications   Medication Instructions   ??? baclofen (LIORESAL) 10 MG tablet Take 2 tablets (20 mg total) by mouth nightly.   ??? furosemide (LASIX) 20 mg, Oral, Daily (standard)   ??? lactulose (CHRONULAC) 20 g, Oral, 3 times a day (standard)   ??? metFORMIN (GLUCOPHAGE-XR) 500 mg, Oral, Daily   ??? multivitamin/iron/folic acid (CENTRUM ORAL) No dose, route, or frequency recorded.   ??? omeprazole (PRILOSEC) 20 MG capsule Take 1 capsule (20 mg total) by mouth every morning before breakfast.   ??? spironolactone (ALDACTONE) 50 mg, Oral, Daily (standard)   ??? XIFAXAN 550 mg, Oral, 2 times a day (standard)       Potential pharmacotherapy related concerns for transplantation:  1. anticoagulation concerns: N/A  2. cytochrome P-450 isoenzyme-mediated drug interactions:   none  3. other drug-interactions with medications post-transplant:  none  4. mental health-related medication(s):   none  5. chronic pain-related medication use:  none  6. use of hormonal contraception and replacement therapy:  none  7. prior or current use of immunosuppressants:none    8. issues with drug absorption: none   9. use of herbal supplements:  none to transplant team's knowledge     Recommendations:  1. No pharmacological concerns for transplantation      Spent 10 minutes completing medication review and addressing any pharmacological concerns with members of the multidisciplinary transplant team.    Thank you,  Vertis Kelch, PharmD

## 2021-07-28 LAB — HEPATITIS B SURFACE ANTIBODY
HEPATITIS B SURFACE ANTIBODY QUANT: <8 m[IU]/mL (ref <8.00)
HEPATITIS B SURFACE ANTIBODY: NONREACTIVE

## 2021-07-28 LAB — HEPATITIS C ANTIBODY: HEPATITIS C ANTIBODY: NONREACTIVE

## 2021-07-28 LAB — HEPATITIS B CORE ANTIBODY, TOTAL: HEPATITIS B CORE TOTAL ANTIBODY: NONREACTIVE

## 2021-07-28 LAB — VITAMIN A: VITAMIN A RESULT: 14.5 ug/dL — ABNORMAL LOW

## 2021-07-28 LAB — NICOTINE SCREEN, URINE
ANABASINE, URINE: <2 ng/mL
COTININE, URINE: <5 ng/mL
NICOTINE, URINE: <5 ng/mL
NORNICOTINE, URINE: <2 ng/mL

## 2021-07-28 NOTE — Unmapped (Signed)
Call placed to notify patient he was internally approved at liver committee discussion on 9/21.  Pt was happy to hear the news, but did state he has noticed slight ascites and LE edema. Pt states primary TNC, Corrie discussed contacting Dr Piedad Climes re: need to adjust diuretics, but had not heard back. Pt states he currently takes 20 lasix and 50 spiro. Pt states @ last LVP they were unable to drain fluid.  This note routed to Dr Piedad Climes for awareness.

## 2021-07-29 LAB — VITAMIN E: VITAMIN E LEVEL: 4.1 mg/L — ABNORMAL LOW

## 2021-07-29 LAB — PHOSPHATIDYLETHANOL (PETH)
PETH 16:0/18:1 (POPETH) BY LC-MS/MS: <10 ng/mL
PETH 16:0/18:2 (PLPETH) BY LC-MS/MS: <10 ng/mL
PETH INTERPRETATION: NEGATIVE

## 2021-07-30 LAB — OPIATE, URINE, QUANTITATIVE
6-MONOACETYLMRPH: <5 ng/mL (ref <5)
BUPRENORPHINE: <5 ng/mL (ref <5)
CODEINE GC/MS CONF: <25 ng/mL (ref <25)
FENTANYL, URINE GC/MS: <0.5 ng/mL (ref <0.5)
HYDROCODONE GC/MS CONF: <25 ng/mL (ref <25)
HYDROMORPHONE GC/MS CONF: <25 ng/mL (ref <25)
MORPHINE GC/MS CONF: <25 ng/mL (ref <25)
NORBUPRENORPHINE: <5 ng/mL (ref <5)
NORFENTANYL, UR GC/MS: <1 ng/mL (ref <1)
OPIATE INTERP: NEGATIVE
OXYCODONE (GC/MS): <25 ng/mL (ref <25)
OXYMORPHONE: <25 ng/mL (ref <25)

## 2021-08-09 NOTE — Unmapped (Signed)
Received referral to verify patients insurance  ??  Insurance has been verified.   ??  ??Patient has active coverage with [employer] [place], including prescription drug coverage via [employer] [phone]. Patient also has [employer] per Britta Mccreedy in contracts this is not insurance and will only use patients primacy coverage of BCBS   ??  Authorization is approved for Liver transplant listing valid until 08/06/22.   ??  ??Patient is financially cleared for Liver transplant lisitng

## 2021-08-15 DIAGNOSIS — Z23 Encounter for immunization: Secondary | ICD-10-CM | POA: Diagnosis not present

## 2021-08-16 DIAGNOSIS — K439 Ventral hernia without obstruction or gangrene: Secondary | ICD-10-CM | POA: Diagnosis not present

## 2021-08-17 DIAGNOSIS — R7989 Other specified abnormal findings of blood chemistry: Secondary | ICD-10-CM | POA: Diagnosis not present

## 2021-08-17 DIAGNOSIS — K7469 Other cirrhosis of liver: Secondary | ICD-10-CM | POA: Diagnosis not present

## 2021-08-19 MED ORDER — SPIRONOLACTONE 50 MG TABLET
ORAL_TABLET | Freq: Every day | ORAL | 3 refills | 45 days | Status: CP
Start: 2021-08-19 — End: ?

## 2021-08-19 MED ORDER — FUROSEMIDE 20 MG TABLET
ORAL_TABLET | Freq: Every day | ORAL | 3 refills | 45 days | Status: CP
Start: 2021-08-19 — End: ?

## 2021-09-14 MED ORDER — FUROSEMIDE 20 MG TABLET
ORAL_TABLET | 3 refills | 0 days | Status: CP
Start: 2021-09-14 — End: ?

## 2021-09-14 NOTE — Unmapped (Signed)
Pt request for RX Refill: furosemide 20 mg tablet

## 2021-09-15 DIAGNOSIS — K7581 Nonalcoholic steatohepatitis (NASH): Principal | ICD-10-CM

## 2021-09-15 DIAGNOSIS — K7460 Unspecified cirrhosis of liver: Principal | ICD-10-CM

## 2021-09-15 MED ORDER — BACLOFEN 10 MG TABLET
ORAL_TABLET | 5 refills | 0.00000 days
Start: 2021-09-15 — End: ?

## 2021-09-16 MED ORDER — BACLOFEN 10 MG TABLET
ORAL_TABLET | 5 refills | 0 days | Status: CP
Start: 2021-09-16 — End: ?

## 2021-09-16 NOTE — Unmapped (Signed)
Requesting a baclofen refill.   Derek Boyer

## 2021-09-21 DIAGNOSIS — K7460 Unspecified cirrhosis of liver: Principal | ICD-10-CM

## 2021-09-21 DIAGNOSIS — K7581 Nonalcoholic steatohepatitis (NASH): Principal | ICD-10-CM

## 2021-09-21 MED ORDER — OMEPRAZOLE 20 MG CAPSULE,DELAYED RELEASE
ORAL_CAPSULE | 1 refills | 0 days | Status: CP
Start: 2021-09-21 — End: ?

## 2021-10-04 NOTE — Unmapped (Signed)
[facility] Clinical Assessment & Refill Coordination Note    Derek Boyer, DOB: [birth year]  Phone: [phone] (home)     All above HIPAA information was verified with patient.     Was a Nurse, learning disability used for this call? No    Specialty Medication(s):   Infectious Disease: Xifaxan     Current Outpatient Medications   Medication Sig Dispense Refill   ??? baclofen (LIORESAL) 10 MG tablet TAKE TWO TABLETS BY MOUTH NIGHTLY 60 tablet 5   ??? furosemide (LASIX) 20 MG tablet TAKE TWO TABLETS BY MOUTH EVERY DAY 90 tablet 3   ??? lactulose (CHRONULAC) 10 gram/15 mL solution Take 30 mL (20 g total) by mouth Three (3) times a day. 2700 mL 5   ??? metFORMIN (GLUCOPHAGE-XR) 500 MG 24 hr tablet Take 500 mg by mouth daily with evening meal.      ??? multivitamin/iron/folic acid (CENTRUM ORAL)      ??? omeprazole (PRILOSEC) 20 MG capsule TAKE ONE CAPSULE BY MOUTH EVERY MORNING BEFORE BREAKFAST 90 capsule 1   ??? rifAXIMin (XIFAXAN) 550 mg Tab Take 1 tablet (550 mg total) by mouth Two (2) times a day. 180 tablet 3   ??? spironolactone (ALDACTONE) 50 MG tablet Take 2 tablets (100 mg total) by mouth daily. 90 tablet 3     No current facility-administered medications for this visit.        Changes to medications: Derek Boyer reports no changes at this time.    Allergies   Allergen Reactions   ??? Other Dermatitis     Dissolvable suture       Changes to allergies: No    SPECIALTY MEDICATION ADHERENCE     Xifaxan 550 mg: approximately 24 days of medicine on hand       Medication Adherence    Patient reported X missed doses in the last month: 1  Specialty Medication: Xifaxan 550mg   Patient is on additional specialty medications: No  Demonstrates understanding of importance of adherence: yes  Informant: patient  Provider-estimated medication adherence level: good  Patient is at risk for Non-Adherence: No          Specialty medication(s) dose(s) confirmed: Regimen is correct and unchanged.     Are there any concerns with adherence? No    Adherence counseling provided? Yes: I suggested he use a key chain pill carrier to help when he is away from home and does not get back in time to remember to take the Xifaxan    CLINICAL MANAGEMENT AND INTERVENTION      Clinical Benefit Assessment:    Do you feel the medicine is effective or helping your condition? Yes    Clinical Benefit counseling provided? Not needed    Adverse Effects Assessment:    Are you experiencing any side effects? No    Are you experiencing difficulty administering your medicine? No    Quality of Life Assessment:    How many days over the past month did your hepatic encephalopathy  keep you from your normal activities? For example, brushing your teeth or getting up in the morning. 0    Have you discussed this with your provider? Not needed    Acute Infection Status:    Acute infections noted within Epic:  No active infections  Patient reported infection: None    Therapy Appropriateness:    Is therapy appropriate and patient progressing towards therapeutic goals? Yes, therapy is appropriate and should be continued    DISEASE/MEDICATION-SPECIFIC INFORMATION  N/A    PATIENT SPECIFIC NEEDS     - Does the patient have any physical, cognitive, or cultural barriers? No    - Is the patient high risk? No    - Does the patient require a Care Management Plan? No     - Does the patient require physician intervention or other additional services (i.e. nutrition, smoking cessation, social work)? No      SHIPPING     Specialty Medication(s) to be Shipped:   Infectious Disease: Xifaxan    Other medication(s) to be shipped: No additional medications requested for fill at this time     Changes to insurance: No    Delivery Scheduled: Yes, Expected medication delivery date: 10/07/21: Patient requested we only fill a 30 day supply this time.     Medication will be delivered via UPS to the confirmed prescription address in [facility].    The patient will receive a drug information handout for each medication shipped and additional FDA Medication Guides as required.  Verified that patient has previously received a Conservation officer, historic buildings and a Surveyor, mining.    The patient or caregiver noted above participated in the development of this care plan and knows that they can request review of or adjustments to the care plan at any time.      All of the patient's questions and concerns have been addressed.    Derek Boyer   [facility] Specialty Pharmacist

## 2021-10-06 DIAGNOSIS — K7581 Nonalcoholic steatohepatitis (NASH): Principal | ICD-10-CM

## 2021-10-06 DIAGNOSIS — K7460 Unspecified cirrhosis of liver: Principal | ICD-10-CM

## 2021-10-06 MED ORDER — OMEPRAZOLE 20 MG CAPSULE,DELAYED RELEASE
ORAL_CAPSULE | 1 refills | 0 days
Start: 2021-10-06 — End: ?

## 2021-10-06 MED FILL — XIFAXAN 550 MG TABLET: ORAL | 30 days supply | Qty: 60 | Fill #4

## 2021-10-10 ENCOUNTER — Ambulatory Visit
Admit: 2021-10-10 | Discharge: 2021-10-11 | Payer: PRIVATE HEALTH INSURANCE | Attending: Gastroenterology | Primary: Gastroenterology

## 2021-10-10 DIAGNOSIS — K7460 Unspecified cirrhosis of liver: Principal | ICD-10-CM

## 2021-10-10 DIAGNOSIS — K7581 Nonalcoholic steatohepatitis (NASH): Principal | ICD-10-CM

## 2021-10-10 DIAGNOSIS — R188 Other ascites: Secondary | ICD-10-CM | POA: Diagnosis not present

## 2021-10-10 DIAGNOSIS — R410 Disorientation, unspecified: Secondary | ICD-10-CM | POA: Diagnosis not present

## 2021-10-10 DIAGNOSIS — R0602 Shortness of breath: Secondary | ICD-10-CM | POA: Diagnosis not present

## 2021-10-10 DIAGNOSIS — K7682 Hepatic encephalopathy: Secondary | ICD-10-CM | POA: Diagnosis not present

## 2021-10-10 DIAGNOSIS — L299 Pruritus, unspecified: Secondary | ICD-10-CM | POA: Diagnosis not present

## 2021-10-10 DIAGNOSIS — B1920 Unspecified viral hepatitis C without hepatic coma: Secondary | ICD-10-CM | POA: Diagnosis not present

## 2021-10-10 DIAGNOSIS — E119 Type 2 diabetes mellitus without complications: Secondary | ICD-10-CM | POA: Diagnosis not present

## 2021-10-10 DIAGNOSIS — R110 Nausea: Secondary | ICD-10-CM | POA: Diagnosis not present

## 2021-10-10 DIAGNOSIS — M6284 Sarcopenia: Secondary | ICD-10-CM | POA: Diagnosis not present

## 2021-10-10 DIAGNOSIS — Z7984 Long term (current) use of oral hypoglycemic drugs: Secondary | ICD-10-CM | POA: Diagnosis not present

## 2021-10-10 DIAGNOSIS — R6881 Early satiety: Secondary | ICD-10-CM | POA: Diagnosis not present

## 2021-10-10 DIAGNOSIS — I10 Essential (primary) hypertension: Secondary | ICD-10-CM | POA: Diagnosis not present

## 2021-10-10 DIAGNOSIS — Z792 Long term (current) use of antibiotics: Secondary | ICD-10-CM | POA: Diagnosis not present

## 2021-10-10 DIAGNOSIS — Z1211 Encounter for screening for malignant neoplasm of colon: Secondary | ICD-10-CM | POA: Diagnosis not present

## 2021-10-10 DIAGNOSIS — Z6832 Body mass index (BMI) 32.0-32.9, adult: Secondary | ICD-10-CM | POA: Diagnosis not present

## 2021-10-10 LAB — COMPREHENSIVE METABOLIC PANEL
ALBUMIN: 2.5 g/dL — ABNORMAL LOW (ref 3.4–5.0)
ALKALINE PHOSPHATASE: 360 U/L — ABNORMAL HIGH (ref 46–116)
ALT (SGPT): 99 U/L — ABNORMAL HIGH (ref 10–49)
ANION GAP: 9 mmol/L (ref 5–14)
AST (SGOT): 205 U/L — ABNORMAL HIGH (ref <=34)
BILIRUBIN TOTAL: 5.5 mg/dL — ABNORMAL HIGH (ref 0.3–1.2)
BLOOD UREA NITROGEN: 14 mg/dL (ref 9–23)
BUN / CREAT RATIO: 18
CALCIUM: 8.8 mg/dL (ref 8.7–10.4)
CHLORIDE: 102 mmol/L (ref 98–107)
CO2: 25.2 mmol/L (ref 20.0–31.0)
CREATININE: 0.79 mg/dL
EGFR CKD-EPI (2021) MALE: >90 mL/min/{1.73_m2} (ref >=60)
GLUCOSE RANDOM: 105 mg/dL (ref 70–179)
POTASSIUM: 4 mmol/L (ref 3.4–4.8)
PROTEIN TOTAL: 7.5 g/dL (ref 5.7–8.2)
SODIUM: 136 mmol/L (ref 135–145)

## 2021-10-10 LAB — CBC W/ AUTO DIFF
BASOPHILS ABSOLUTE COUNT: 0 10*9/L (ref 0.0–0.1)
BASOPHILS RELATIVE PERCENT: 0.4 %
EOSINOPHILS ABSOLUTE COUNT: 0.2 10*9/L (ref 0.0–0.5)
EOSINOPHILS RELATIVE PERCENT: 2.4 %
HEMATOCRIT: 41.8 % (ref 39.0–48.0)
HEMOGLOBIN: 14.3 g/dL (ref 12.9–16.5)
LYMPHOCYTES ABSOLUTE COUNT: 1 10*9/L — ABNORMAL LOW (ref 1.1–3.6)
LYMPHOCYTES RELATIVE PERCENT: 14.9 %
MEAN CORPUSCULAR HEMOGLOBIN CONC: 34.1 g/dL (ref 32.0–36.0)
MEAN CORPUSCULAR HEMOGLOBIN: 33.8 pg — ABNORMAL HIGH (ref 25.9–32.4)
MEAN CORPUSCULAR VOLUME: 98.9 fL — ABNORMAL HIGH (ref 77.6–95.7)
MEAN PLATELET VOLUME: 10.1 fL (ref 6.8–10.7)
MONOCYTES ABSOLUTE COUNT: 0.9 10*9/L — ABNORMAL HIGH (ref 0.3–0.8)
MONOCYTES RELATIVE PERCENT: 14.3 %
NEUTROPHILS ABSOLUTE COUNT: 4.5 10*9/L (ref 1.8–7.8)
NEUTROPHILS RELATIVE PERCENT: 68 %
PLATELET COUNT: 189 10*9/L (ref 150–450)
RED BLOOD CELL COUNT: 4.23 10*12/L — ABNORMAL LOW (ref 4.26–5.60)
RED CELL DISTRIBUTION WIDTH: 14.5 % (ref 12.2–15.2)
WBC ADJUSTED: 6.6 10*9/L (ref 3.6–11.2)

## 2021-10-10 LAB — PROTIME-INR
INR: 1.55
PROTIME: 17.8 s — ABNORMAL HIGH (ref 9.8–12.8)

## 2021-10-10 LAB — BILIRUBIN, DIRECT: BILIRUBIN DIRECT: 3.9 mg/dL — ABNORMAL HIGH (ref 0.00–0.30)

## 2021-10-10 LAB — AFP TUMOR MARKER: AFP-TUMOR MARKER: <2 ng/mL (ref <=8)

## 2021-10-10 MED ORDER — SPIRONOLACTONE 50 MG TABLET
ORAL_TABLET | Freq: Every day | ORAL | 3 refills | 30.00000 days | Status: CP
Start: 2021-10-10 — End: 2021-10-10

## 2021-10-10 MED ORDER — FUROSEMIDE 20 MG TABLET
ORAL_TABLET | Freq: Every day | ORAL | 3 refills | 30.00000 days | Status: CP
Start: 2021-10-10 — End: 2021-10-10

## 2021-10-10 NOTE — Unmapped (Signed)
[facility]er Visit    Reason for Visit: Follow-up of Cirrhosis  Primary Care Provider: Gaspar Garbe, MD    Assessment & Plan: Derek Boyer is a 40 y.o. male with a PMHx of T2DM, obesity, HTN and NASH cirrhosis c/b non-bleeding G1EVs, HE, and ascites that presents for follow-up of NASH cirrhosis.    NASH Cirrhosis: Complicated by non-bleeding Grade I EVs, hepatic encephalopathy, and ascites/BLE edema. Major issues from a liver perspective today are volume status and sarcopenia. Today, he appears hypervolemic on exam (ascites and LE edema) and reports uncomfortable abdominal distention despite adherence to 40 mg lasix/100 mg spironolactone. He is also sarcopenic on exam with obvious temporal wasting. His poor appetite and early satiety may be related to volume overload. If we can get him more euvolemic with oral diuretics and IV lasix/ albumin, perhaps he can tolerate protein shakes and meals. Worsening albumin will only exacerbate his edema. Cramps and persistent fatigue are amongst his most bothersome complaints.     -- labs today MELD score-19  -- internally approved for liver transplant--> plan to add to liver selection meeting this week for active listing; Blood group A    Ascites/ BLE edema  -- increase lasix to 60 mg and spironolactone to 150 mg  -- arrange albumin/ IV lasix infusions q 2 weeks with pretransplant coordinator    Sarcopenia   -- Continue protein supplementation    Cramping (likely result of low albumin)  - Continue 20 mg baclofen nightly  - OTC Slow mag/ 50 mg Zinc supplementation  - IV albumin should help as well    Immunizations   - Shingrix #1 today (pre transplant)  - Heplisav today #1 (non responder to Hep B series)    -- follow-up in 3 months    Health Maintenance in Cirrhosis:   -- HCC Screening: UTD; will decide what imaging is needed at next clinic visit due March 2023  -- Varices Screening: UTD - Next Due 09/2022 (G1EVs on last scope)  -- Colon Cancer Screening: Start at age 18  -- Osteoporosis Screening: Vit D low normal (should start D3 2000u)    Vaccines in Cirrhosis:   -- Hepatitis A: Immune  -- Hepatitis B: Nonimmune despite vaccinations  -- Pneumococcal: PCV-13 ordered today, s/p PPSV-23 (08/2019)  -- Shingles: Due at age 71- given today  -- COVID-19: S/p primary series + booster    The patient was seen and discussed with Dr. Piedad Boyer,  who agreed with the above assessment and plan.     Derek Boyer. Derek Boyer, ANP- [facility]  [address]  [place] [address]  Phone [phone]      Subjective   HPI: Derek Boyer is a 40 y.o. male with a PMHx of T2DM, obesity, HTN and NASH cirrhosis c/b non-bleeding G1EVs, HE, and ascites that is seen in follow-up for NASH cirrhosis.    Prior History: He has family history of NAFLD. He has never had significant ETOH intake and none since he was told her had liver disease. His aunt and brother have fatty liver. No other family history of liver disease or GI cancers. His peak weight was 260. Per outside records, 4/21 labs w/ Tbili 4.1, AST 131, ALT 78, alk phos 153. He was seen by Dr. Christella Boyer for liver, but wanted second opinion so referred to [facility]. Cirrhosis incidentally noted during lap chole (liver biopsy path showed early cirrhosis with no clear etiology). Pathology reports includes:  macro and microvesicular steatosis involving less than 20% hepatocytes, lobular activity with chronic inflammation, PAS and PASD stains negative, portal tracts distended by abundant chronic inflammation with bile duct proliferation, interface activity involving majority of portal tracts, bridging portal to portal fibrosis. Presumed NAFLD. W/u with negative hepatitis studies, normal iron panel and ferritin, ceruloplasmin 39, A1AT 231, ASMA neg, AMA neg, ANA neg. Started hep A/B immunization 2019, which are complete.   ??  At clinic visit 03/29/20, planned for labs and repeat MRI. The over read at [facility] from outside MRI noted Hypoenhancing 1.0 cm hepatic segment 8 lesion, unchanged dating back to 05/12/2019 and without other T2/DWI signal abnormality. 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 5/24 labs showed Tbili 3.7, direct bili 2.4, AST 152, ALT 88, Alk phos 171, AFP 4, INR 1.21, albumin 3.1, plts 115. Per telephone notes, discussion at [facility] conference was that a segment 8 lesion was thought to be regenerative nodule so recommended f/u MRI in 6 months.   ??  Interim History: Derek Boyer presents today to clinic with his wife, Derek Boyer, who is a Teacher, early years/pre. LCV was 06/06/2021 with Dr. Piedad Boyer. Since that time he had an ED visit for worsening ascites (07/04/2021). No paracentesis was performed due to limited fluid seen on [place] and concern for surrounding bowel. Was discharged home with close f/u and diuretics were increased to 40 mg lasix/100 spironolactone per Dr. Piedad Boyer. He has been internally approved for liver transplant with blood group A. He has also met with the surgeons (07/25/2021). Labs drawn at that visit showed MELD 19. Today, overall, he feels like he is doing worse. His ascites is not well controlled and he reports tense ascites that is very uncomfortable. His ascites is being managed with diuretics and sodium restriction. Also, has BLE edema and worsening SOB. He reports poor appetite and early satiety and is having a hard time meeting his protein requirements, sometimes going all day without eating. Leg cramping is worse despite 20 mg baclofen nightly. Also plagued by persistent fatigue and no longer able to get to the gym. No overt HE, but wife does think that his thinking is slower. Has 2-3 BMs daily on lactulose + rifaximin for HE. Has noted ecchymosis on arms. Interestingly, he reports burning scrotal pain accompanied by scabs that resolve on their own. Denies scrotal swelling. Denies upper/ lower GI bleed. His A1c remains low ~ 4.8 His wife is concerned about his temporal wasting and weight loss. Adherence to low Na diet. No new medications. Patient endorses jaundice, pruritis, SOB, increased abdominal girth, LE edema, nausea, and confusion. He denies upper or lower GI bleeding, fever, or CP.         Objective   Problem List:  Patient Active Problem List   Diagnosis   ??? 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 Hepatic encephalopathy   ??? 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 Obesity (BMI 30-39.9)   ??? Type 2 diabetes mellitus without complication, without long-term current use of insulin (CMS-HCC)     Medications:     Current Outpatient Medications:   ???  baclofen (LIORESAL) 10 MG tablet, TAKE TWO TABLETS BY MOUTH NIGHTLY, Disp: 60 tablet, Rfl: 5  ???  lactulose (CHRONULAC) 10 gram/15 mL solution, Take 30 mL (20 g total) by mouth Three (3) times a day., Disp: 2700 mL, Rfl: 5  ???  metFORMIN (GLUCOPHAGE-XR) 500 MG 24 hr tablet, Take 500 mg by mouth daily with evening  meal. , Disp: , Rfl:   ???  multivitamin/iron/folic acid (CENTRUM ORAL), , Disp: , Rfl:   ???  omeprazole (PRILOSEC) 20 MG capsule, TAKE ONE CAPSULE BY MOUTH EVERY MORNING BEFORE BREAKFAST, Disp: 90 capsule, Rfl: 1  ???  rifAXIMin (XIFAXAN) 550 mg Tab, Take 1 tablet (550 mg total) by mouth Two (2) times a day., Disp: 180 tablet, Rfl: 3  ???  furosemide (LASIX) 20 MG tablet, Take 3 tablets (60 mg total) by mouth daily., Disp: 90 tablet, Rfl: 3  ???  spironolactone (ALDACTONE) 50 MG tablet, Take 3 tablets (150 mg total) by mouth daily., Disp: 90 tablet, Rfl: 3      Vital Signs:  Vitals:    10/10/21 0811   BP: 127/79   Pulse: 69   Temp: 37.1 ??C (98.8 ??F)   SpO2: 99%      Body mass index is 32.47 kg/m??.    Physical Exam:   General: ill appearing WM in NAD, temporal wasting   Eyes: Sclerae mildly icteric. No xanthelasma.   ENT: Well hydrated moist mucous membrane of the oral cavity. Oropharynx without any erythema or exudate.   Neck: Supple without any enlargements, no thyromegaly, JVD. No palpable lymphadenopathy.   Cardiovascular: S1 and S2 normal, without murmurs, rubs, or gallops. Regular rate and rhythm.   Lungs: Clear to auscultation bilateraly, without wheezes/crackles/rhonchi. Good air movement.   Skin: + terry nails, +spider angiomatas, + palmar erythema, +ecchymosis  Abdomen: normoactive bowel sounds, abdomen soft, non-tender,  distended, + ascites + hepatomegaly  Extremeties: No bilateral cyanosis, clubbing. BLE 2+ edema  Neurological: Alert and oriented times three. Grossly non focal. No asterixis.    Labs:  Office Visit on 10/10/2021   Component Date Value Ref Range Status   ??? AFP-Tumor Marker 10/10/2021 <2  <=8 ng/mL Final   ??? PT 10/10/2021 17.8 (H)  9.8 - 12.8 sec Final   ??? INR 10/10/2021 1.55   Final   ??? Bilirubin, Direct 10/10/2021 3.90 (H)  0.00 - 0.30 mg/dL Final   ??? Sodium 16/08/9603 136  135 - 145 mmol/L Final   ??? Potassium 10/10/2021 4.0  3.4 - 4.8 mmol/L Final   ??? Chloride 10/10/2021 102  98 - 107 mmol/L Final   ??? CO2 10/10/2021 25.2  20.0 - 31.0 mmol/L Final   ??? Anion Gap 10/10/2021 9  5 - 14 mmol/L Final   ??? BUN 10/10/2021 14  9 - 23 mg/dL Final   ??? Creatinine 10/10/2021 0.79  0.60 - 1.10 mg/dL Final   ??? BUN/Creatinine Ratio 10/10/2021 18   Final   ??? eGFR CKD-EPI (2021) Male 10/10/2021 >90  >=60 mL/min/1.85m2 Final   ??? Glucose 10/10/2021 105  70 - 179 mg/dL Final   ??? Calcium 54/07/8118 8.8  8.7 - 10.4 mg/dL Final   ??? Albumin 14/78/2956 2.5 (L)  3.4 - 5.0 g/dL Final   ??? Total Protein 10/10/2021 7.5  5.7 - 8.2 g/dL Final   ??? Total Bilirubin 10/10/2021 5.5 (H)  0.3 - 1.2 mg/dL Final   ??? AST 21/30/8657 205 (H)  <=34 U/L Final   ??? ALT 10/10/2021 99 (H)  10 - 49 U/L Final   ??? Alkaline Phosphatase 10/10/2021 360 (H)  46 - 116 U/L Final   ??? WBC 10/10/2021 6.6  3.6 - 11.2 10*9/L Final   ??? RBC 10/10/2021 4.23 (L)  4.26 - 5.60 10*12/L Final   ??? HGB 10/10/2021 14.3  12.9 - 16.5 g/dL Final   ??? HCT 84/69/6295 41.8  39.0 - 48.0 % Final   ??? MCV 10/10/2021 98.9 (H)  77.6 - 95.7 fL Final   ??? MCH 10/10/2021 33.8 (H)  25.9 - 32.4 pg Final   ??? MCHC 10/10/2021 34.1  32.0 - 36.0 g/dL Final   ??? RDW 16/08/9603 14.5  12.2 - 15.2 % Final   ??? MPV 10/10/2021 10.1  6.8 - 10.7 fL Final   ??? Platelet 10/10/2021 189  150 - 450 10*9/L Final   ??? Neutrophils % 10/10/2021 68.0  % Final   ??? Lymphocytes % 10/10/2021 14.9  % Final   ??? Monocytes % 10/10/2021 14.3  % Final   ??? Eosinophils % 10/10/2021 2.4  % Final   ??? Basophils % 10/10/2021 0.4  % Final   ??? Absolute Neutrophils 10/10/2021 4.5  1.8 - 7.8 10*9/L Final   ??? Absolute Lymphocytes 10/10/2021 1.0 (L)  1.1 - 3.6 10*9/L Final   ??? Absolute Monocytes 10/10/2021 0.9 (H)  0.3 - 0.8 10*9/L Final   ??? Absolute Eosinophils 10/10/2021 0.2  0.0 - 0.5 10*9/L Final   ??? Absolute Basophils 10/10/2021 0.0  0.0 - 0.1 10*9/L Final     MELD-Na score: 19 at 10/10/2021 10:36 AM  MELD score: 18 at 10/10/2021 10:36 AM  Calculated from:  Serum Creatinine: 0.79 mg/dL (Using min of 1 mg/dL) at 54/0/9811 91:47 AM  Serum Sodium: 136 mmol/L at 10/10/2021 10:36 AM  Total Bilirubin: 5.5 mg/dL at 82/07/5620 30:86 AM  INR(ratio): 1.55 at 10/10/2021 10:36 AM  Age: 32 years     CPT score 11 Childs C

## 2021-10-10 NOTE — Unmapped (Addendum)
-   increase lasix to 60 mg and spironolactone to 150 mg  - labs today  - TRY to increase protein through protein shakes- will help with ascites and muscle wasting  - start taking slow mag/ 50 mg zinc- can help with cramping  - IV albumin and lasix infusions - someone will help set these up  - immunizations today  - can try jock strap for mild scrotal edema  - will need either weekly or monthly labs- we will let you know  - keep in good contact with [name], your pre transplant coordinator    Lillia Abed T. Jayce Kainz, ANP- [facility]  [facility]  [address]  [place] [address]  Phone [phone]

## 2021-10-11 DIAGNOSIS — K7682 Hepatic encephalopathy: Principal | ICD-10-CM

## 2021-10-11 DIAGNOSIS — K7460 Unspecified cirrhosis of liver: Principal | ICD-10-CM

## 2021-10-11 DIAGNOSIS — E119 Type 2 diabetes mellitus without complications: Principal | ICD-10-CM

## 2021-10-11 DIAGNOSIS — K7581 Nonalcoholic steatohepatitis (NASH): Principal | ICD-10-CM

## 2021-10-11 MED ORDER — MAGNESIUM OXIDE 400 MG (241.3 MG MAGNESIUM) TABLET
ORAL_TABLET | 3 refills | 0 days
Start: 2021-10-11 — End: ?

## 2021-10-11 NOTE — Unmapped (Signed)
Dr Piedad Climes requested start OP infusion for albumin and lasix q2 weeks.  Pt called and he stated preference was to have this done at [facility]. Therapy plan entered and request sent to front desk for scheduling appt. Pt said schedule is flexible and did not have dates to avoid. Pt had no questions or concerns at this time.

## 2021-10-12 MED ORDER — MAGNESIUM OXIDE 400 MG (241.3 MG MAGNESIUM) TABLET
ORAL_TABLET | 3 refills | 0 days | Status: CP
Start: 2021-10-12 — End: ?

## 2021-10-12 NOTE — Unmapped (Signed)
Pt request for RX Refill : magnesium oxide 400 mg (241.3 mg magnesium) tablet

## 2021-10-18 ENCOUNTER — Ambulatory Visit: Admit: 2021-10-18 | Discharge: 2021-10-19 | Payer: PRIVATE HEALTH INSURANCE

## 2021-10-18 DIAGNOSIS — E119 Type 2 diabetes mellitus without complications: Secondary | ICD-10-CM | POA: Diagnosis not present

## 2021-10-18 DIAGNOSIS — K7581 Nonalcoholic steatohepatitis (NASH): Secondary | ICD-10-CM | POA: Diagnosis not present

## 2021-10-18 DIAGNOSIS — K7682 Hepatic encephalopathy: Secondary | ICD-10-CM | POA: Diagnosis not present

## 2021-10-18 DIAGNOSIS — K7460 Unspecified cirrhosis of liver: Secondary | ICD-10-CM | POA: Diagnosis not present

## 2021-10-18 LAB — COMPREHENSIVE METABOLIC PANEL
ALBUMIN: 2.5 g/dL — ABNORMAL LOW (ref 3.4–5.0)
ALKALINE PHOSPHATASE: 385 U/L — ABNORMAL HIGH (ref 46–116)
ALT (SGPT): 105 U/L — ABNORMAL HIGH (ref 10–49)
ANION GAP: 8 mmol/L (ref 5–14)
AST (SGOT): 231 U/L — ABNORMAL HIGH (ref <=34)
BILIRUBIN TOTAL: 4.8 mg/dL — ABNORMAL HIGH (ref 0.3–1.2)
BLOOD UREA NITROGEN: 16 mg/dL (ref 9–23)
BUN / CREAT RATIO: 20
CALCIUM: 9 mg/dL (ref 8.7–10.4)
CHLORIDE: 101 mmol/L (ref 98–107)
CO2: 24 mmol/L (ref 20.0–31.0)
CREATININE: 0.79 mg/dL
EGFR CKD-EPI (2021) MALE: >90 mL/min/{1.73_m2} (ref >=60)
GLUCOSE RANDOM: 111 mg/dL (ref 70–179)
POTASSIUM: 3.9 mmol/L (ref 3.4–4.8)
PROTEIN TOTAL: 7.6 g/dL (ref 5.7–8.2)
SODIUM: 133 mmol/L — ABNORMAL LOW (ref 135–145)

## 2021-10-18 LAB — CBC W/ AUTO DIFF
BASOPHILS ABSOLUTE COUNT: 0 10*9/L (ref 0.0–0.1)
BASOPHILS RELATIVE PERCENT: 0.6 %
EOSINOPHILS ABSOLUTE COUNT: 0.2 10*9/L (ref 0.0–0.5)
EOSINOPHILS RELATIVE PERCENT: 2.1 %
HEMATOCRIT: 43.1 % (ref 39.0–48.0)
HEMOGLOBIN: 14.8 g/dL (ref 12.9–16.5)
LYMPHOCYTES ABSOLUTE COUNT: 1 10*9/L — ABNORMAL LOW (ref 1.1–3.6)
LYMPHOCYTES RELATIVE PERCENT: 12.8 %
MEAN CORPUSCULAR HEMOGLOBIN CONC: 34.4 g/dL (ref 32.0–36.0)
MEAN CORPUSCULAR HEMOGLOBIN: 33.7 pg — ABNORMAL HIGH (ref 25.9–32.4)
MEAN CORPUSCULAR VOLUME: 97.9 fL — ABNORMAL HIGH (ref 77.6–95.7)
MEAN PLATELET VOLUME: 10.2 fL (ref 6.8–10.7)
MONOCYTES ABSOLUTE COUNT: 0.8 10*9/L (ref 0.3–0.8)
MONOCYTES RELATIVE PERCENT: 11.2 %
NEUTROPHILS ABSOLUTE COUNT: 5.5 10*9/L (ref 1.8–7.8)
NEUTROPHILS RELATIVE PERCENT: 73.3 %
PLATELET COUNT: 165 10*9/L (ref 150–450)
RED BLOOD CELL COUNT: 4.4 10*12/L (ref 4.26–5.60)
RED CELL DISTRIBUTION WIDTH: 14.8 % (ref 12.2–15.2)
WBC ADJUSTED: 7.5 10*9/L (ref 3.6–11.2)

## 2021-10-18 LAB — PROTIME-INR
INR: 1.48
PROTIME: 17 s — ABNORMAL HIGH (ref 9.8–12.8)

## 2021-10-18 MED ADMIN — albumin human 25 % bottle 50 g: 50 g | INTRAVENOUS | @ 20:00:00 | Stop: 2021-10-18

## 2021-10-18 MED ADMIN — furosemide (LASIX) injection 40 mg: 40 mg | INTRAVENOUS | @ 20:00:00 | Stop: 2021-10-18

## 2021-10-18 NOTE — Unmapped (Signed)
1415 Patient arrived to the Transplant Infusion Room today for albumin and lasix, Condition: well; Mobility: ambulating; accompanied by spouse.   See Flowsheet and MAR for all details of visit.   1418 VS stable.  1428 PIV placed and secured with coban, labs collected and sent, urine no orders found.  1430 Infusion initiated. Patient educated to notify nursing staff if they experience urticaria, erythema, nausea, shortness of breath, nausea, metal taste in mouth, excessive oral or postnasal drainage and any other changes in status which could be side effects from initiating this medication.  1512 Infusion complete.  1513 Lasix 40 mg IV push administered.  1530 VS stable, PIV removed, patient left clinic today, Condition: well; Mobility: ambulating; accompanied by spouse.

## 2021-10-20 MED ORDER — ONE-A-DAY MEN'S COMPLETE 240 MCG-30 MCG-300 MCG TABLET
ORAL_TABLET | 0 refills | 0 days
Start: 2021-10-20 — End: ?

## 2021-10-21 NOTE — Unmapped (Signed)
Pt request for RX Refill: One-A-Day Men's Complete 240 mcg-30 mcg-300 mcg tablet

## 2021-10-24 MED ORDER — ONE-A-DAY MEN'S COMPLETE 240 MCG-30 MCG-300 MCG TABLET
ORAL_TABLET | 0 refills | 0 days | Status: CP
Start: 2021-10-24 — End: ?

## 2021-10-24 MED ORDER — OMEPRAZOLE 20 MG CAPSULE,DELAYED RELEASE
ORAL_CAPSULE | 1 refills | 0 days | Status: CP
Start: 2021-10-24 — End: ?

## 2021-11-01 ENCOUNTER — Ambulatory Visit: Admit: 2021-11-01 | Discharge: 2021-11-02 | Payer: PRIVATE HEALTH INSURANCE

## 2021-11-01 DIAGNOSIS — Z79899 Other long term (current) drug therapy: Secondary | ICD-10-CM | POA: Diagnosis not present

## 2021-11-01 DIAGNOSIS — K7460 Unspecified cirrhosis of liver: Secondary | ICD-10-CM | POA: Diagnosis not present

## 2021-11-01 DIAGNOSIS — K7581 Nonalcoholic steatohepatitis (NASH): Secondary | ICD-10-CM | POA: Diagnosis not present

## 2021-11-01 DIAGNOSIS — E119 Type 2 diabetes mellitus without complications: Secondary | ICD-10-CM | POA: Diagnosis not present

## 2021-11-01 DIAGNOSIS — K7682 Hepatic encephalopathy: Secondary | ICD-10-CM | POA: Diagnosis not present

## 2021-11-01 LAB — COMPREHENSIVE METABOLIC PANEL
ALBUMIN: 2.8 g/dL — ABNORMAL LOW (ref 3.4–5.0)
ALKALINE PHOSPHATASE: 351 U/L — ABNORMAL HIGH (ref 46–116)
ALT (SGPT): 103 U/L — ABNORMAL HIGH (ref 10–49)
ANION GAP: 9 mmol/L (ref 5–14)
AST (SGOT): 195 U/L — ABNORMAL HIGH (ref <=34)
BILIRUBIN TOTAL: 6.5 mg/dL — ABNORMAL HIGH (ref 0.3–1.2)
BLOOD UREA NITROGEN: 14 mg/dL (ref 9–23)
BUN / CREAT RATIO: 17
CALCIUM: 8.6 mg/dL — ABNORMAL LOW (ref 8.7–10.4)
CHLORIDE: 102 mmol/L (ref 98–107)
CO2: 25 mmol/L (ref 20.0–31.0)
CREATININE: 0.83 mg/dL
EGFR CKD-EPI (2021) MALE: >90 mL/min/{1.73_m2} (ref >=60)
GLUCOSE RANDOM: 128 mg/dL (ref 70–179)
POTASSIUM: 4.2 mmol/L (ref 3.4–4.8)
PROTEIN TOTAL: 7.8 g/dL (ref 5.7–8.2)
SODIUM: 136 mmol/L (ref 135–145)

## 2021-11-01 LAB — CBC
HEMATOCRIT: 43.7 % (ref 39.0–48.0)
HEMOGLOBIN: 14.7 g/dL (ref 12.9–16.5)
MEAN CORPUSCULAR HEMOGLOBIN CONC: 33.7 g/dL (ref 32.0–36.0)
MEAN CORPUSCULAR HEMOGLOBIN: 33 pg — ABNORMAL HIGH (ref 25.9–32.4)
MEAN CORPUSCULAR VOLUME: 98.1 fL — ABNORMAL HIGH (ref 77.6–95.7)
MEAN PLATELET VOLUME: 10.6 fL (ref 6.8–10.7)
PLATELET COUNT: 171 10*9/L (ref 150–450)
RED BLOOD CELL COUNT: 4.46 10*12/L (ref 4.26–5.60)
RED CELL DISTRIBUTION WIDTH: 15.5 % — ABNORMAL HIGH (ref 12.2–15.2)
WBC ADJUSTED: 5.9 10*9/L (ref 3.6–11.2)

## 2021-11-01 LAB — PROTIME-INR
INR: 1.44
PROTIME: 16.5 s — ABNORMAL HIGH (ref 9.8–12.8)

## 2021-11-01 MED ADMIN — albumin human 25 % 25 % bottle 50 g: 50 g | INTRAVENOUS | @ 15:00:00 | Stop: 2021-11-01

## 2021-11-01 MED ADMIN — furosemide (LASIX) injection 40 mg: 40 mg | INTRAVENOUS | @ 16:00:00 | Stop: 2021-11-01

## 2021-11-01 NOTE — Unmapped (Signed)
1610 Patient arrived to the Transplant Infusion Room today for albumin and lasix, Condition: well; Mobility: ambulating; accompanied by relative.   See Flowsheet and MAR for all details of visit.   0955 VS stable.  1006 PIV placed and secured with coban, labs collected and sent, urine no orders found.  1008 Infusion initiated. Patient educated to notify nursing staff if they experience urticaria, erythema, nausea, shortness of breath, nausea, metal taste in mouth, excessive oral or postnasal drainage and any other changes in status which could be side effects from initiating this medication.  1052 Infusion complete.  1053 Lasix 40 mg IV push administered.  1100 VS stable, PIV removed, patient left clinic today, Condition: well; Mobility: ambulating; accompanied by relative.

## 2021-11-15 ENCOUNTER — Ambulatory Visit: Admit: 2021-11-15 | Discharge: 2021-11-16 | Payer: PRIVATE HEALTH INSURANCE

## 2021-11-15 DIAGNOSIS — K7682 Hepatic encephalopathy: Secondary | ICD-10-CM | POA: Diagnosis not present

## 2021-11-15 DIAGNOSIS — K7581 Nonalcoholic steatohepatitis (NASH): Secondary | ICD-10-CM | POA: Diagnosis not present

## 2021-11-15 DIAGNOSIS — K7460 Unspecified cirrhosis of liver: Secondary | ICD-10-CM | POA: Diagnosis not present

## 2021-11-15 DIAGNOSIS — E119 Type 2 diabetes mellitus without complications: Secondary | ICD-10-CM | POA: Diagnosis not present

## 2021-11-15 LAB — COMPREHENSIVE METABOLIC PANEL
ALBUMIN: 2.6 g/dL — ABNORMAL LOW (ref 3.4–5.0)
ALKALINE PHOSPHATASE: 279 U/L — ABNORMAL HIGH (ref 46–116)
ALT (SGPT): 65 U/L — ABNORMAL HIGH (ref 10–49)
ANION GAP: 9 mmol/L (ref 5–14)
AST (SGOT): 142 U/L — ABNORMAL HIGH (ref <=34)
BILIRUBIN TOTAL: 6.3 mg/dL — ABNORMAL HIGH (ref 0.3–1.2)
BLOOD UREA NITROGEN: 15 mg/dL (ref 9–23)
BUN / CREAT RATIO: 25
CALCIUM: 8.6 mg/dL — ABNORMAL LOW (ref 8.7–10.4)
CHLORIDE: 102 mmol/L (ref 98–107)
CO2: 20 mmol/L (ref 20.0–31.0)
CREATININE: 0.6 mg/dL
EGFR CKD-EPI (2021) MALE: >90 mL/min/{1.73_m2} (ref >=60)
GLUCOSE RANDOM: 163 mg/dL (ref 70–179)
POTASSIUM: 4.5 mmol/L (ref 3.4–4.8)
PROTEIN TOTAL: 7.2 g/dL (ref 5.7–8.2)
SODIUM: 131 mmol/L — ABNORMAL LOW (ref 135–145)

## 2021-11-15 LAB — CBC
HEMATOCRIT: 38.3 % — ABNORMAL LOW (ref 39.0–48.0)
HEMOGLOBIN: 13.2 g/dL (ref 12.9–16.5)
MEAN CORPUSCULAR HEMOGLOBIN CONC: 34.6 g/dL (ref 32.0–36.0)
MEAN CORPUSCULAR HEMOGLOBIN: 34.1 pg — ABNORMAL HIGH (ref 25.9–32.4)
MEAN CORPUSCULAR VOLUME: 98.7 fL — ABNORMAL HIGH (ref 77.6–95.7)
MEAN PLATELET VOLUME: 9.6 fL (ref 6.8–10.7)
PLATELET COUNT: 178 10*9/L (ref 150–450)
RED BLOOD CELL COUNT: 3.88 10*12/L — ABNORMAL LOW (ref 4.26–5.60)
RED CELL DISTRIBUTION WIDTH: 15.4 % — ABNORMAL HIGH (ref 12.2–15.2)
WBC ADJUSTED: 7 10*9/L (ref 3.6–11.2)

## 2021-11-15 LAB — PROTIME-INR
INR: 1.54
PROTIME: 17.7 s — ABNORMAL HIGH (ref 9.8–12.8)

## 2021-11-15 MED ADMIN — furosemide (LASIX) injection 40 mg: 40 mg | INTRAVENOUS | @ 16:00:00 | Stop: 2021-11-15

## 2021-11-15 MED ADMIN — albumin human 25 % 25 % bottle 50 g: 50 g | INTRAVENOUS | @ 15:00:00 | Stop: 2021-11-15

## 2021-11-15 NOTE — Unmapped (Signed)
[facility] Refill Coordination Note    Specialty Medication(s) to be Shipped:   Infectious Disease: Xifaxan 550mg   Other medication(s) to be shipped: No additional medications requested for fill at this time     [name], DOB: 1981/06/17  Phone: [phone] (home)     All above HIPAA information was verified with patient.     Was a Nurse, learning disability used for this call? No    Completed refill call assessment today to schedule patient's medication shipment from the [facility] [phone]).  All relevant notes have been reviewed.     Specialty medication(s) and dose(s) confirmed: Regimen is correct and unchanged.   Changes to medications: [name] reports no changes at this time.  Changes to insurance: No  New side effects reported not previously addressed with a pharmacist or physician: None reported  Questions for the pharmacist: No    Confirmed patient received a Conservation officer, historic buildings and a Surveyor, mining with first shipment. The patient will receive a drug information handout for each medication shipped and additional FDA Medication Guides as required.       DISEASE/MEDICATION-SPECIFIC INFORMATION        N/A    SPECIALTY MEDICATION ADHERENCE     Medication Adherence    Patient reported X missed doses in the last month: 0  Specialty Medication: Xifaxan 550mg   Patient is on additional specialty medications: No  Patient is on more than two specialty medications: No  Informant: patient  Reliability of informant: reliable  Reasons for non-adherence: no problems identified  Confirmed plan for next specialty medication refill: delivery by pharmacy  Refills needed for supportive medications: not needed        Were doses missed due to medication being on hold? No    Xifaxan 550mg : 5 days of medicine on hand     REFERRAL TO PHARMACIST     Referral to the pharmacist: Not needed    [facility]     Shipping address confirmed in Epic.     Delivery Scheduled: Yes, Expected medication delivery date: 11/17/2021.     Medication will be delivered via UPS to the prescription address in Epic WAM.    Jalal Rauch P [facility] [place] [facility] Specialty Technician

## 2021-11-15 NOTE — Unmapped (Signed)
1610 Patient arrived to the Transplant Infusion Room today for albumin and lasix, Condition: well; Mobility: ambulating; accompanied by spouse.   See Flowsheet and MAR for all details of visit.   0941 VS stable.  1008 PIV placed and secured with coban, labs collected and sent, urine no orders found.  1009 Infusion initiated. Patient educated to notify nursing staff if they experience urticaria, erythema, nausea, shortness of breath, nausea, metal taste in mouth, excessive oral or postnasal drainage and any other changes in status which could be side effects from initiating this medication.  1050 Infusion complete.  1051 Lasix 40 mg IV push administered.  1100 VS stable, PIV removed, patient left clinic today, Condition: well; Mobility: ambulating; accompanied by spouse.

## 2021-11-17 DIAGNOSIS — R202 Paresthesia of skin: Secondary | ICD-10-CM | POA: Diagnosis not present

## 2021-11-17 NOTE — Unmapped (Signed)
Derek Boyer 's xifaxan shipment will be delayed as a result of the medication is too soon to refill until 1/15.     I have reached out to the patient  at [phone] and left a voicemail message.  We will wait for a call back from the patient to reschedule the delivery.  We have not confirmed the new delivery date.

## 2021-11-18 NOTE — Unmapped (Signed)
[name] called back about the delivery for Xifaxan and would like the delivery to ship out 09/23/22 via UPS  to be delivered 09/24/22 . We have confirmed the delivery via UPS delivery sed .

## 2021-11-23 DIAGNOSIS — K7682 Hepatic encephalopathy: Principal | ICD-10-CM

## 2021-11-23 DIAGNOSIS — K7581 Nonalcoholic steatohepatitis (NASH): Principal | ICD-10-CM

## 2021-11-23 DIAGNOSIS — K7460 Unspecified cirrhosis of liver: Principal | ICD-10-CM

## 2021-11-23 MED FILL — XIFAXAN 550 MG TABLET: ORAL | 30 days supply | Qty: 60 | Fill #5

## 2021-11-24 DIAGNOSIS — Z125 Encounter for screening for malignant neoplasm of prostate: Secondary | ICD-10-CM | POA: Diagnosis not present

## 2021-11-24 DIAGNOSIS — E1165 Type 2 diabetes mellitus with hyperglycemia: Secondary | ICD-10-CM | POA: Diagnosis not present

## 2021-11-25 NOTE — Unmapped (Signed)
Insurance has been verified for 2023.   ??  ??Patient has active coverage with??[employer] [place], including prescription drug coverage via??[employer] [phone]. Patient also has Medi-share per Britta Mccreedy in contracts this is not insurance??and will only use patients primacy coverage of BCBS??  ??  Authorization is approved for Liver transplant listing valid until 08/06/22.   ??  ??Patient is financially cleared for Liver transplant lisitng

## 2021-11-29 ENCOUNTER — Ambulatory Visit: Admit: 2021-11-29 | Discharge: 2021-11-30 | Payer: PRIVATE HEALTH INSURANCE

## 2021-11-29 DIAGNOSIS — E119 Type 2 diabetes mellitus without complications: Secondary | ICD-10-CM | POA: Diagnosis not present

## 2021-11-29 DIAGNOSIS — K7682 Hepatic encephalopathy: Secondary | ICD-10-CM | POA: Diagnosis not present

## 2021-11-29 DIAGNOSIS — K7581 Nonalcoholic steatohepatitis (NASH): Secondary | ICD-10-CM | POA: Diagnosis not present

## 2021-11-29 DIAGNOSIS — K7460 Unspecified cirrhosis of liver: Secondary | ICD-10-CM | POA: Diagnosis not present

## 2021-11-29 LAB — PROTIME-INR
INR: 1.54
PROTIME: 17.7 s — ABNORMAL HIGH (ref 9.8–12.8)

## 2021-11-29 LAB — COMPREHENSIVE METABOLIC PANEL
ALBUMIN: 2.8 g/dL — ABNORMAL LOW (ref 3.4–5.0)
ALKALINE PHOSPHATASE: 284 U/L — ABNORMAL HIGH (ref 46–116)
ALT (SGPT): 79 U/L — ABNORMAL HIGH (ref 10–49)
ANION GAP: 8 mmol/L (ref 5–14)
AST (SGOT): 174 U/L — ABNORMAL HIGH (ref <=34)
BILIRUBIN TOTAL: 7.7 mg/dL — ABNORMAL HIGH (ref 0.3–1.2)
BLOOD UREA NITROGEN: 21 mg/dL (ref 9–23)
BUN / CREAT RATIO: 31
CALCIUM: 9.4 mg/dL (ref 8.7–10.4)
CHLORIDE: 103 mmol/L (ref 98–107)
CO2: 20 mmol/L (ref 20.0–31.0)
CREATININE: 0.67 mg/dL
EGFR CKD-EPI (2021) MALE: >90 mL/min/{1.73_m2} (ref >=60)
GLUCOSE RANDOM: 142 mg/dL (ref 70–179)
POTASSIUM: 4.2 mmol/L (ref 3.4–4.8)
PROTEIN TOTAL: 7.9 g/dL (ref 5.7–8.2)
SODIUM: 131 mmol/L — ABNORMAL LOW (ref 135–145)

## 2021-11-29 LAB — CBC
HEMATOCRIT: 43.2 % (ref 39.0–48.0)
HEMOGLOBIN: 14.3 g/dL (ref 12.9–16.5)
MEAN CORPUSCULAR HEMOGLOBIN CONC: 33 g/dL (ref 32.0–36.0)
MEAN CORPUSCULAR HEMOGLOBIN: 32.6 pg — ABNORMAL HIGH (ref 25.9–32.4)
MEAN CORPUSCULAR VOLUME: 98.7 fL — ABNORMAL HIGH (ref 77.6–95.7)
MEAN PLATELET VOLUME: 10.4 fL (ref 6.8–10.7)
PLATELET COUNT: 201 10*9/L (ref 150–450)
RED BLOOD CELL COUNT: 4.37 10*12/L (ref 4.26–5.60)
RED CELL DISTRIBUTION WIDTH: 16.2 % — ABNORMAL HIGH (ref 12.2–15.2)
WBC ADJUSTED: 7.5 10*9/L (ref 3.6–11.2)

## 2021-11-29 MED ADMIN — albumin human 25 % 25 % bottle 50 g: 50 g | INTRAVENOUS | @ 15:00:00 | Stop: 2021-11-29

## 2021-11-29 MED ADMIN — furosemide (LASIX) injection 40 mg: 40 mg | INTRAVENOUS | @ 16:00:00 | Stop: 2021-11-29

## 2021-11-29 NOTE — Unmapped (Signed)
1000 Patient arrived to the Transplant Infusion Room today for albumin and lasix, Condition: well; Mobility: ambulating; accompanied by relative.   See Flowsheet and MAR for all details of visit.   1003 VS stable.  1010 PIV placed and secured with coban, labs collected and sent, urine no orders found.  1014 Infusion initiated. Patient educated to notify nursing staff if they experience urticaria, erythema, nausea, shortness of breath, nausea, metal taste in mouth, excessive oral or postnasal drainage and any other changes in status which could be side effects from initiating this medication.  1057 Infusion complete.  1057 Lasix 40 mg IV push administered.  1110 VS stable, PIV removed, patient left clinic today, Condition: well; Mobility: ambulating; accompanied by relative.

## 2021-12-01 DIAGNOSIS — Z1339 Encounter for screening examination for other mental health and behavioral disorders: Secondary | ICD-10-CM | POA: Diagnosis not present

## 2021-12-01 DIAGNOSIS — Z1331 Encounter for screening for depression: Secondary | ICD-10-CM | POA: Diagnosis not present

## 2021-12-01 DIAGNOSIS — E1165 Type 2 diabetes mellitus with hyperglycemia: Secondary | ICD-10-CM | POA: Diagnosis not present

## 2021-12-01 DIAGNOSIS — Z0000 Encounter for general adult medical examination without abnormal findings: Secondary | ICD-10-CM | POA: Diagnosis not present

## 2021-12-01 DIAGNOSIS — R82998 Other abnormal findings in urine: Secondary | ICD-10-CM | POA: Diagnosis not present

## 2021-12-13 ENCOUNTER — Ambulatory Visit: Admit: 2021-12-13 | Discharge: 2021-12-14 | Payer: PRIVATE HEALTH INSURANCE

## 2021-12-13 DIAGNOSIS — E119 Type 2 diabetes mellitus without complications: Secondary | ICD-10-CM | POA: Diagnosis not present

## 2021-12-13 DIAGNOSIS — K7682 Hepatic encephalopathy: Secondary | ICD-10-CM | POA: Diagnosis not present

## 2021-12-13 DIAGNOSIS — K7581 Nonalcoholic steatohepatitis (NASH): Secondary | ICD-10-CM | POA: Diagnosis not present

## 2021-12-13 DIAGNOSIS — K7460 Unspecified cirrhosis of liver: Secondary | ICD-10-CM | POA: Diagnosis not present

## 2021-12-13 LAB — CBC
HEMATOCRIT: 40.4 % (ref 39.0–48.0)
HEMOGLOBIN: 13.6 g/dL (ref 12.9–16.5)
MEAN CORPUSCULAR HEMOGLOBIN CONC: 33.8 g/dL (ref 32.0–36.0)
MEAN CORPUSCULAR HEMOGLOBIN: 33.1 pg — ABNORMAL HIGH (ref 25.9–32.4)
MEAN CORPUSCULAR VOLUME: 98 fL — ABNORMAL HIGH (ref 77.6–95.7)
MEAN PLATELET VOLUME: 11 fL — ABNORMAL HIGH (ref 6.8–10.7)
PLATELET COUNT: 172 10*9/L (ref 150–450)
RED BLOOD CELL COUNT: 4.12 10*12/L — ABNORMAL LOW (ref 4.26–5.60)
RED CELL DISTRIBUTION WIDTH: 16.1 % — ABNORMAL HIGH (ref 12.2–15.2)
WBC ADJUSTED: 6.5 10*9/L (ref 3.6–11.2)

## 2021-12-13 LAB — COMPREHENSIVE METABOLIC PANEL
ALBUMIN: 2.9 g/dL — ABNORMAL LOW (ref 3.4–5.0)
ALKALINE PHOSPHATASE: 287 U/L — ABNORMAL HIGH (ref 46–116)
ALT (SGPT): 95 U/L — ABNORMAL HIGH (ref 10–49)
ANION GAP: 9 mmol/L (ref 5–14)
AST (SGOT): 192 U/L — ABNORMAL HIGH (ref <=34)
BILIRUBIN TOTAL: 7.6 mg/dL — ABNORMAL HIGH (ref 0.3–1.2)
BLOOD UREA NITROGEN: 15 mg/dL (ref 9–23)
BUN / CREAT RATIO: 23
CALCIUM: 8.8 mg/dL (ref 8.7–10.4)
CHLORIDE: 102 mmol/L (ref 98–107)
CO2: 21 mmol/L (ref 20.0–31.0)
CREATININE: 0.66 mg/dL
EGFR CKD-EPI (2021) MALE: >90 mL/min/{1.73_m2} (ref >=60)
GLUCOSE RANDOM: 150 mg/dL (ref 70–179)
POTASSIUM: 4.1 mmol/L (ref 3.4–4.8)
PROTEIN TOTAL: 7.8 g/dL (ref 5.7–8.2)
SODIUM: 132 mmol/L — ABNORMAL LOW (ref 135–145)

## 2021-12-13 LAB — PROTIME-INR
INR: 1.57
PROTIME: 18.1 s — ABNORMAL HIGH (ref 9.8–12.8)

## 2021-12-13 MED ADMIN — furosemide (LASIX) injection 40 mg: 40 mg | INTRAVENOUS | @ 16:00:00 | Stop: 2021-12-13

## 2021-12-13 MED ADMIN — albumin human 25 % 25 % bottle 50 g: 50 g | INTRAVENOUS | @ 15:00:00 | Stop: 2021-12-13

## 2021-12-13 NOTE — Unmapped (Signed)
1005 Patient arrived to the Transplant Infusion Room today for albumin and lasix, Condition: well; Mobility: ambulating; accompanied by spouse.   See Flowsheet and MAR for all details of visit.   1008 VS stable.  1026 PIV placed and secured with coban, labs collected and sent, urine no orders found.  1029 Infusion initiated. Patient educated to notify nursing staff if they experience urticaria, erythema, nausea, shortness of breath, nausea, metal taste in mouth, excessive oral or postnasal drainage and any other changes in status which could be side effects from initiating this medication.  1113 Infusion complete.  1114 Lasix 40 mg IV push administered.  1120 VS stable, PIV removed, patient left clinic today, Condition: well; Mobility: ambulating; accompanied by spouse.

## 2021-12-15 DIAGNOSIS — H0288B Meibomian gland dysfunction left eye, upper and lower eyelids: Secondary | ICD-10-CM | POA: Diagnosis not present

## 2021-12-15 DIAGNOSIS — H0288A Meibomian gland dysfunction right eye, upper and lower eyelids: Secondary | ICD-10-CM | POA: Diagnosis not present

## 2021-12-15 DIAGNOSIS — H35413 Lattice degeneration of retina, bilateral: Secondary | ICD-10-CM | POA: Diagnosis not present

## 2021-12-15 DIAGNOSIS — E119 Type 2 diabetes mellitus without complications: Secondary | ICD-10-CM | POA: Diagnosis not present

## 2021-12-26 DIAGNOSIS — K7460 Unspecified cirrhosis of liver: Principal | ICD-10-CM

## 2021-12-26 DIAGNOSIS — K7581 Nonalcoholic steatohepatitis (NASH): Principal | ICD-10-CM

## 2021-12-26 DIAGNOSIS — K7682 Hepatic encephalopathy: Principal | ICD-10-CM

## 2021-12-26 MED ORDER — RIFAXIMIN 550 MG TABLET
ORAL_TABLET | Freq: Two times a day (BID) | ORAL | 3 refills | 90.00000 days | Status: CP
Start: 2021-12-26 — End: ?
  Filled 2021-12-26: qty 60, 30d supply, fill #0

## 2021-12-26 MED ORDER — XIFAXAN 550 MG TABLET
ORAL_TABLET | Freq: Two times a day (BID) | ORAL | 3 refills | 90.00000 days | Status: CN
Start: 2021-12-26 — End: ?

## 2021-12-26 NOTE — Unmapped (Signed)
[facility] Refill Coordination Note    Specialty Medication(s) to be Shipped:   Infectious Disease: Xifaxan    Other medication(s) to be shipped: No additional medications requested for fill at this time     Derek Boyer, DOB: 07-Jul-1981  Phone: [phone] (home) [phone] (work)      All above HIPAA information was verified with patient's caregiver, Derek Boyer     Was a Nurse, learning disability used for this call? No    Completed refill call assessment today to schedule patient's medication shipment from the [facility] [phone]).  All relevant notes have been reviewed.     Specialty medication(s) and dose(s) confirmed: Regimen is correct and unchanged.   Changes to medications: Derek Boyer reports no changes at this time.  Changes to insurance: No  New side effects reported not previously addressed with a pharmacist or physician: None reported  Questions for the pharmacist: No    Confirmed patient received a Conservation officer, historic buildings and a Surveyor, mining with first shipment. The patient will receive a drug information handout for each medication shipped and additional FDA Medication Guides as required.       DISEASE/MEDICATION-SPECIFIC INFORMATION        N/A    SPECIALTY MEDICATION ADHERENCE     Medication Adherence    Patient reported X missed doses in the last month: 0  Specialty Medication: Xifaxan 550mg   Patient is on additional specialty medications: No        Were doses missed due to medication being on hold? No    Xifaxan 550 mg: 1 days of medicine on hand     REFERRAL TO PHARMACIST     Referral to the pharmacist: Not needed      [facility]     Shipping address confirmed in Epic.     Delivery Scheduled: Yes, Expected medication delivery date: 12/27/2021.     Medication will be delivered via UPS to the prescription address in Epic WAM.    Derek Boyer Derek Boyer

## 2021-12-27 ENCOUNTER — Ambulatory Visit: Admit: 2021-12-27 | Discharge: 2021-12-28 | Payer: PRIVATE HEALTH INSURANCE

## 2021-12-27 DIAGNOSIS — K7682 Hepatic encephalopathy: Secondary | ICD-10-CM | POA: Diagnosis not present

## 2021-12-27 DIAGNOSIS — E119 Type 2 diabetes mellitus without complications: Secondary | ICD-10-CM | POA: Diagnosis not present

## 2021-12-27 DIAGNOSIS — K7581 Nonalcoholic steatohepatitis (NASH): Secondary | ICD-10-CM | POA: Diagnosis not present

## 2021-12-27 DIAGNOSIS — K7460 Unspecified cirrhosis of liver: Secondary | ICD-10-CM | POA: Diagnosis not present

## 2021-12-27 LAB — COMPREHENSIVE METABOLIC PANEL
ALBUMIN: 2.9 g/dL — ABNORMAL LOW (ref 3.4–5.0)
ALKALINE PHOSPHATASE: 288 U/L — ABNORMAL HIGH (ref 46–116)
ALT (SGPT): 92 U/L — ABNORMAL HIGH (ref 10–49)
ANION GAP: 9 mmol/L (ref 5–14)
AST (SGOT): 186 U/L — ABNORMAL HIGH (ref <=34)
BILIRUBIN TOTAL: 7.2 mg/dL — ABNORMAL HIGH (ref 0.3–1.2)
BLOOD UREA NITROGEN: 17 mg/dL (ref 9–23)
BUN / CREAT RATIO: 20
CALCIUM: 9.1 mg/dL (ref 8.7–10.4)
CHLORIDE: 104 mmol/L (ref 98–107)
CO2: 22 mmol/L (ref 20.0–31.0)
CREATININE: 0.84 mg/dL
EGFR CKD-EPI (2021) MALE: >90 mL/min/{1.73_m2} (ref >=60)
GLUCOSE RANDOM: 150 mg/dL (ref 70–179)
POTASSIUM: 4.4 mmol/L (ref 3.4–4.8)
PROTEIN TOTAL: 7.7 g/dL (ref 5.7–8.2)
SODIUM: 135 mmol/L (ref 135–145)

## 2021-12-27 LAB — CBC
HEMATOCRIT: 39.6 % (ref 39.0–48.0)
HEMOGLOBIN: 13.8 g/dL (ref 12.9–16.5)
MEAN CORPUSCULAR HEMOGLOBIN CONC: 34.8 g/dL (ref 32.0–36.0)
MEAN CORPUSCULAR HEMOGLOBIN: 34.3 pg — ABNORMAL HIGH (ref 25.9–32.4)
MEAN CORPUSCULAR VOLUME: 98.7 fL — ABNORMAL HIGH (ref 77.6–95.7)
MEAN PLATELET VOLUME: 10.7 fL (ref 6.8–10.7)
PLATELET COUNT: 160 10*9/L (ref 150–450)
RED BLOOD CELL COUNT: 4.01 10*12/L — ABNORMAL LOW (ref 4.26–5.60)
RED CELL DISTRIBUTION WIDTH: 16.6 % — ABNORMAL HIGH (ref 12.2–15.2)
WBC ADJUSTED: 8.8 10*9/L (ref 3.6–11.2)

## 2021-12-27 LAB — PROTIME-INR
INR: 1.5
PROTIME: 17.2 s — ABNORMAL HIGH (ref 9.8–12.8)

## 2021-12-27 MED ADMIN — albumin human 25 % 25 % bottle 50 g: 50 g | INTRAVENOUS | @ 15:00:00 | Stop: 2021-12-27

## 2021-12-27 MED ADMIN — furosemide (LASIX) injection 40 mg: 40 mg | INTRAVENOUS | @ 16:00:00 | Stop: 2021-12-27

## 2021-12-27 NOTE — Unmapped (Signed)
1610 Patient arrived to the Transplant Infusion Room today for albumin and lasix, Condition: well; Mobility: ambulating; accompanied by spouse.   See Flowsheet and MAR for all details of visit.   0955 VS stable.  1002 PIV placed and secured with coban, labs collected and sent, urine no orders found.  1007 Infusion initiated. Patient educated to notify nursing staff if they experience urticaria, erythema, nausea, shortness of breath, nausea, metal taste in mouth, excessive oral or postnasal drainage and any other changes in status which could be side effects from initiating this medication.  1051 Infusion complete.  1053 Lasix 40 mg IV push administered.  1108 VS stable, PIV removed, patient left clinic today, Condition: well; Mobility: ambulating; accompanied by spouse.

## 2021-12-28 NOTE — Unmapped (Signed)
Archived PA requests in Cover My Meds as this has been completed by the [facility] and medication was approved    Lanora Manis, Charity fundraiser

## 2021-12-29 ENCOUNTER — Ambulatory Visit
Admit: 2021-12-29 | Discharge: 2021-12-30 | Payer: PRIVATE HEALTH INSURANCE | Attending: Gastroenterology | Primary: Gastroenterology

## 2021-12-29 DIAGNOSIS — K7581 Nonalcoholic steatohepatitis (NASH): Principal | ICD-10-CM

## 2021-12-29 DIAGNOSIS — Z7682 Awaiting organ transplant status: Principal | ICD-10-CM

## 2021-12-29 DIAGNOSIS — Z129 Encounter for screening for malignant neoplasm, site unspecified: Principal | ICD-10-CM

## 2021-12-29 DIAGNOSIS — K7460 Unspecified cirrhosis of liver: Principal | ICD-10-CM

## 2021-12-29 NOTE — Unmapped (Addendum)
--   labs and imaging reviewed  -- continue current meds and IV lasix /albumin every 2 weeks  -- increase baclofen to 30mg  at bedtime  -- Heplisav and Shingrix #2 next month  -- Due for MRI in mid March--> please get vaccination when you come for MRI (order entered, please schedule  -- Continue Z pack  -- OTC--> guifenesin/decongestant combo and non-sedating antihistamine   -- follow-up in 3 months    Important Contact Numbers:    GI Clinic Appointments:? [phone]  Please call the GI Clinic appointment line if you need to schedule, reschedule or cancel an appointment in clinic.? They can also answer any questions you may have about where your appointment is located and when you need to arrive.    GI Procedure Appointments:?[phone]  Please call the GI Procedures line if you need to schedule, reschedule or cancel ANY type of procedure (EGD, colonoscopy, motility testing, etc).? You can also call this number for prep instructions, etc.?    Radiology: [phone], option 3 or 4  If you are being scheduled for any type of radiology, you will need to call to receive your appointment time.? Please call this number for information.?    For emergencies after normal business hours or on weekends/holidays  Proceed to the nearest emergency room OR contact the [facility] Operator at [phone] who can page the Gastroenterology Fellow on call.    Financial Counselors:   Tracey Harries [phone]   Dorisann Frames [phone]     Pharmacy Assistance: [phone]    Nurse Line: Silvestre Moment, RN  Phone: [phone]  Main line [facility]: [phone]

## 2021-12-29 NOTE — Unmapped (Signed)
[facility] [place] [facility] [phone]          Reason for Visit: Follow-up of Cirrhosis  Primary Care Provider: Gaspar Garbe, MD    Assessment & Plan: Derek Boyer is a 41 y.o. male with a PMHx of T2DM, obesity, HTN and NASH cirrhosis c/b non-bleeding G1EVs, HE, and ascites that presents for follow-up of NASH cirrhosis.    NASH Cirrhosis: Complicated by non-bleeding Grade I EVs, hepatic encephalopathy, and ascites/BLE edema. Today, he appears euvolemic with addition of IV lasix and albumin infusions. Cramps and persistent fatigue are amongst his most bothersome complaints. Also has persistent URI.    -- labs today MELD score-19  -- internally approved for liver transplant--> Blood group A  -- Due for [facility] screen with MRI abd in March (number given to schedule)    Ascites/ BLE edema  -- continue lasix to 60 mg and spironolactone to 150 mg  -- continue IV lasix /albumin every 2 weeks    Sarcopenia   -- Continue high protein diet   -- Continue modest exercise    Cramping   - increase baclofen to 30mg  at bedtime  - OTC Slow mag/ 50 mg Zinc supplementation  - IV albumin should help as well    Immunizations   -- Heplisav and Shingrix #2 next month (can get when here for MRI)    URI  -- Continue Z pack  -- OTC--> guifenesin/decongestant combo and non-sedating antihistamine   -- Please call if not improving as may have a sinus infection    -- follow-up in 3 months    Health Maintenance in Cirrhosis:   -- HCC Screening: UTD; will decide what imaging is needed at next clinic visit due March 2023  -- Varices Screening: UTD - Next Due 09/2022 (G1EVs on last scope)  -- Colon Cancer Screening: Start at age 18  -- Osteoporosis Screening: Vit D low normal (should start D3 2000u)    Vaccines in Cirrhosis:   -- Hepatitis A: Immune  -- Hepatitis B: Due for Heplisav B #2  -- Pneumococcal: PCV-13 12/2020, s/p PPSV-23 (08/2019)  -- Shingles: Due at age 45- due for #2  -- COVID-19: S/p primary series + booster    I spent a majority of my time today with the patient reviewing historical data, old records (including faxed data and data in Care Everywhere), performing a physical examination, and formulating an assessment and plan together with the patient.     We will be happy to monitor his medical condition along with you in order to optimize his care as it pertains to underlying liver disease. Please send all future, relevant clinical data to [place] via fax [phone] so that we can remain up to date. We will remain available for any questions or concerns that may arise in the course of this patient's care.          Lakai Moree M. Piedad Climes, MD  [facility]          Subjective   HPI: Derek Boyer is a 41 y.o. male with a PMHx of T2DM, obesity, HTN and NASH cirrhosis c/b non-bleeding G1EVs, HE, and ascites that is seen in follow-up for NASH cirrhosis. He is internally approved for [facility].     Prior History: He has family history of NAFLD. He has never had significant ETOH intake and none since he was told her had liver disease. His aunt and brother have fatty liver. No other family history of liver  disease or GI cancers. His peak weight was 260. Per outside records, 4/21 labs w/ Tbili 4.1, AST 131, ALT 78, alk phos 153. He was seen by Dr. Christella Hartigan for liver, but wanted second opinion so referred to [facility]. Cirrhosis incidentally noted during lap chole (liver biopsy path showed early cirrhosis with no clear etiology). 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 Presumed NAFLD. W/u with negative hepatitis studies, normal iron panel and ferritin, ceruloplasmin 39, A1AT 231, ASMA neg, AMA neg, ANA neg. Started hep A/B immunization 2019, which are complete.   ??  At clinic visit 03/29/20, planned for labs and repeat MRI. The over read at [facility] from outside MRI noted Hypoenhancing 1.0 cm hepatic segment 8 lesion, unchanged dating back to 05/12/2019 and without other T2/DWI signal abnormality. 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 5/24 labs showed Tbili 3.7, direct bili 2.4, AST 152, ALT 88, Alk phos 171, AFP 4, INR 1.21, albumin 3.1, plts 115. Per telephone notes, discussion at [facility] conference was that a segment 8 lesion was thought to be regenerative nodule so recommended f/u MRI in 6 months.   ??  Interim History: [name] presents today to clinic with his wife, [name], who is a Teacher, early years/pre. LCV was 10/10/21. At that visit, we started him on IV lasix and albumin infusions every 2 weeks. He is feeling much better from a fluid standpoint. His weight is stable at 185-190. He tries to eat high protein and low sodium. His muscle mass is quite good. Continues to have muscle cramps. Originally helped by baclofen but less effective at the moment. No overt confusion and remains on lactulose for 2-3 BM's daily and Xifaxan. He reports burning scrotal pain accompanied by scabs that resolve on their own. Denies scrotal swelling. This has improved with nystatin powder. For the past 2 weeks he has had purulent drainage from his nose and red, scratch, gooey eyes. He denies cough or fever. He is blowing his nose constantly and quite congested. He has started a course of azithromycin. Only using saline mist. Patient endorses jaundice, pruritis, SOB, increased abdominal girth, LE edema, nausea, and confusion. He denies upper or lower GI bleeding, fever, or CP.         Objective   Problem List:  Patient Active Problem List   Diagnosis   ??? 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 Hepatic encephalopathy   ??? 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 Obesity (BMI 30-39.9)   ??? Type 2 diabetes mellitus without complication, without long-term current use of insulin (CMS-HCC)     Medications:     Current Outpatient Medications:   ???  baclofen (LIORESAL) 10 MG tablet, TAKE TWO TABLETS BY MOUTH NIGHTLY, Disp: 60 tablet, Rfl: 5  ???  furosemide (LASIX) 20 MG tablet, Take 3 tablets (60 mg total) by mouth daily., Disp: 90 tablet, Rfl: 3  ???  lactulose (CHRONULAC) 10 gram/15 mL solution, Take 30 mL (20 g total) by mouth Three (3) times a day., Disp: 2700 mL, Rfl: 5  ???  magnesium oxide (MAG-OX) 400 mg (241.3 mg elemental magnesium) tablet, TAKE 1 Tablet BY MOUTH ONCE DAILY, Disp: 90 tablet, Rfl: 3  ???  metFORMIN (GLUCOPHAGE-XR) 500 MG 24 hr tablet, Take 500  mg by mouth daily with evening meal. , Disp: , Rfl:   ???  multivitamin/iron/folic acid (CENTRUM ORAL), , Disp: , Rfl:   ???  omeprazole (PRILOSEC) 20 MG capsule, TAKE ONE CAPSULE BY MOUTH EVERY MORNING BEFORE BREAKFAST, Disp: 90 capsule, Rfl: 1  ???  rifAXIMin (XIFAXAN) 550 mg Tab, Take 1 tablet (550 mg total) by mouth Two (2) times a day., Disp: 180 tablet, Rfl: 3  ???  spironolactone (ALDACTONE) 50 MG tablet, Take 3 tablets (150 mg total) by mouth daily., Disp: 90 tablet, Rfl: 3  ???  zinc gluconate 50 mg (7 mg elemental zinc) tablet, Take 50 mg by mouth daily. Taking twice a day, Disp: , Rfl:   ???  ONE-A-DAY MEN'S COMPLETE 240 mcg-30 mcg- 300 mcg Tab, take 1 Tablet by mouth at bedtime, Disp: 100 tablet, Rfl: 0      Vital Signs:  Vitals:    12/29/21 0827   BP: 121/73   Pulse: 68   Temp: 36.4 ??C (97.6 ??F)   SpO2: 100%      Body mass index is 30.82 kg/m??.    Physical Exam:   General: ill appearing WM in NAD, temporal wasting   Eyes: Sclerae mildly icteric. No xanthelasma.   ENT: Well hydrated moist mucous membrane of the oral cavity. Oropharynx without any erythema or exudate.   Neck: Supple without any enlargements, no thyromegaly, JVD. No palpable lymphadenopathy.   Cardiovascular: S1 and S2 normal, without murmurs, rubs, or gallops. Regular rate and rhythm.   Lungs: Clear to auscultation bilateraly, without wheezes/crackles/rhonchi. Good air movement.   Skin: + terry nails, +spider angiomatas, + palmar erythema, +ecchymosis  Abdomen: normoactive bowel sounds, abdomen soft, non-tender,  distended, + ascites + hepatomegaly  Extremeties: No bilateral cyanosis, clubbing. BLE 2+ edema  Neurological: Alert and oriented times three. Grossly non focal. No asterixis.    Labs:  Infusion on 12/27/2021   Component Date Value Ref Range Status   ??? PT 12/27/2021 17.2 (H)  9.8 - 12.8 sec Final   ??? INR 12/27/2021 1.50   Final   ??? Sodium 12/27/2021 135  135 - 145 mmol/L Final   ??? Potassium 12/27/2021 4.4  3.4 - 4.8 mmol/L Final   ??? Chloride 12/27/2021 104  98 - 107 mmol/L Final   ??? CO2 12/27/2021 22.0  20.0 - 31.0 mmol/L Final   ??? Anion Gap 12/27/2021 9  5 - 14 mmol/L Final   ??? BUN 12/27/2021 17  9 - 23 mg/dL Final   ??? Creatinine 12/27/2021 0.84  0.60 - 1.10 mg/dL Final   ??? BUN/Creatinine Ratio 12/27/2021 20   Final   ??? eGFR CKD-EPI (2021) Male 12/27/2021 >90  >=60 mL/min/1.60m2 Final   ??? Glucose 12/27/2021 150  70 - 179 mg/dL Final   ??? Calcium 29/56/2130 9.1  8.7 - 10.4 mg/dL Final   ??? Albumin 86/57/8469 2.9 (L)  3.4 - 5.0 g/dL Final   ??? Total Protein 12/27/2021 7.7  5.7 - 8.2 g/dL Final   ??? Total Bilirubin 12/27/2021 7.2 (H)  0.3 - 1.2 mg/dL Final   ??? AST 62/95/2841 186 (H)  <=34 U/L Final   ??? ALT 12/27/2021 92 (H)  10 - 49 U/L Final   ??? Alkaline Phosphatase 12/27/2021 288 (H)  46 - 116 U/L Final   ??? WBC 12/27/2021 8.8  3.6 - 11.2 10*9/L Final   ??? RBC 12/27/2021 4.01 (L)  4.26 - 5.60 10*12/L Final   ??? HGB 12/27/2021 13.8  12.9 - 16.5  g/dL Final   ??? HCT 16/08/9603 39.6  39.0 - 48.0 % Final   ??? MCV 12/27/2021 98.7 (H)  77.6 - 95.7 fL Final   ??? MCH 12/27/2021 34.3 (H)  25.9 - 32.4 pg Final   ??? MCHC 12/27/2021 34.8  32.0 - 36.0 g/dL Final   ??? RDW 54/07/8118 16.6 (H)  12.2 - 15.2 % Final   ??? MPV 12/27/2021 10.7  6.8 - 10.7 fL Final   ??? Platelet 12/27/2021 160  150 - 450 10*9/L Final     MELD-Na score: 19 at 12/27/2021  9:56 AM  MELD score: 18 at 12/27/2021  9:56 AM  Calculated from:  Serum Creatinine: 0.84 mg/dL (Using min of 1 mg/dL) at 1/47/8295  6:21 AM  Serum Sodium: 135 mmol/L at 12/27/2021  9:56 AM  Total Bilirubin: 7.2 mg/dL at 01/11/6577  4:69 AM  INR(ratio): 1.50 at 12/27/2021  9:56 AM  Age: 68 years     CPT score 11 Childs C

## 2022-01-04 DIAGNOSIS — J019 Acute sinusitis, unspecified: Secondary | ICD-10-CM | POA: Diagnosis not present

## 2022-01-10 ENCOUNTER — Ambulatory Visit: Admit: 2022-01-10 | Discharge: 2022-01-11 | Payer: PRIVATE HEALTH INSURANCE

## 2022-01-10 DIAGNOSIS — K7460 Unspecified cirrhosis of liver: Principal | ICD-10-CM

## 2022-01-10 DIAGNOSIS — K7581 Nonalcoholic steatohepatitis (NASH): Principal | ICD-10-CM

## 2022-01-10 DIAGNOSIS — E119 Type 2 diabetes mellitus without complications: Secondary | ICD-10-CM | POA: Diagnosis not present

## 2022-01-10 DIAGNOSIS — K7682 Hepatic encephalopathy: Secondary | ICD-10-CM | POA: Diagnosis not present

## 2022-01-10 LAB — COMPREHENSIVE METABOLIC PANEL
ALBUMIN: 2.9 g/dL — ABNORMAL LOW (ref 3.4–5.0)
ALKALINE PHOSPHATASE: 280 U/L — ABNORMAL HIGH (ref 46–116)
ALT (SGPT): 109 U/L — ABNORMAL HIGH (ref 10–49)
ANION GAP: 8 mmol/L (ref 5–14)
AST (SGOT): 231 U/L — ABNORMAL HIGH (ref <=34)
BILIRUBIN TOTAL: 6.4 mg/dL — ABNORMAL HIGH (ref 0.3–1.2)
BLOOD UREA NITROGEN: 16 mg/dL (ref 9–23)
BUN / CREAT RATIO: 24
CALCIUM: 8.7 mg/dL (ref 8.7–10.4)
CHLORIDE: 102 mmol/L (ref 98–107)
CO2: 24 mmol/L (ref 20.0–31.0)
CREATININE: 0.68 mg/dL
EGFR CKD-EPI (2021) MALE: >90 mL/min/{1.73_m2} (ref >=60)
GLUCOSE RANDOM: 92 mg/dL (ref 70–179)
POTASSIUM: 4.4 mmol/L (ref 3.4–4.8)
PROTEIN TOTAL: 7.5 g/dL (ref 5.7–8.2)
SODIUM: 134 mmol/L — ABNORMAL LOW (ref 135–145)

## 2022-01-10 LAB — PROTIME-INR
INR: 1.54
PROTIME: 17.7 s — ABNORMAL HIGH (ref 9.8–12.8)

## 2022-01-10 LAB — CBC
HEMATOCRIT: 36.9 % — ABNORMAL LOW (ref 39.0–48.0)
HEMOGLOBIN: 13.2 g/dL (ref 12.9–16.5)
MEAN CORPUSCULAR HEMOGLOBIN CONC: 35.9 g/dL (ref 32.0–36.0)
MEAN CORPUSCULAR HEMOGLOBIN: 34.7 pg — ABNORMAL HIGH (ref 25.9–32.4)
MEAN CORPUSCULAR VOLUME: 96.5 fL — ABNORMAL HIGH (ref 77.6–95.7)
MEAN PLATELET VOLUME: 10.3 fL (ref 6.8–10.7)
PLATELET COUNT: 139 10*9/L — ABNORMAL LOW (ref 150–450)
RED BLOOD CELL COUNT: 3.82 10*12/L — ABNORMAL LOW (ref 4.26–5.60)
RED CELL DISTRIBUTION WIDTH: 15.7 % — ABNORMAL HIGH (ref 12.2–15.2)
WBC ADJUSTED: 7.4 10*9/L (ref 3.6–11.2)

## 2022-01-10 MED ORDER — SPIRONOLACTONE 100 MG TABLET
ORAL_TABLET | 3 refills | 0 days
Start: 2022-01-10 — End: ?

## 2022-01-10 MED ORDER — BACLOFEN 10 MG TABLET
ORAL_TABLET | 5 refills | 0 days
Start: 2022-01-10 — End: ?

## 2022-01-10 MED ADMIN — albumin human 25 % 25 % bottle 50 g: 50 g | INTRAVENOUS | @ 13:00:00 | Stop: 2022-01-10

## 2022-01-10 MED ADMIN — furosemide (LASIX) injection 40 mg: 40 mg | INTRAVENOUS | @ 14:00:00 | Stop: 2022-01-10

## 2022-01-10 NOTE — Unmapped (Signed)
0745 Patient arrived to the Transplant Infusion Room today for albumin and lasix, Condition: well; Mobility: ambulating; accompanied by relative.   See Flowsheet and MAR for all details of visit.   0750 VS stable.  2956 PIV placed and secured with coban, labs collected and sent, urine no orders found.  0810 Infusion initiated. Patient educated to notify nursing staff if they experience urticaria, erythema, nausea, shortness of breath, nausea, metal taste in mouth, excessive oral or postnasal drainage and any other changes in status which could be side effects from initiating this medication.  2130 Infusion complete.  0857 Lasix 40 mg IV push administered.  0915 VS stable, PIV removed, patient left clinic today, Condition: well; Mobility: ambulating; accompanied by self.

## 2022-01-12 NOTE — Unmapped (Signed)
[facility] Refill Coordination Note    Specialty Medication(s) to be Shipped:   Infectious Disease: Xifaxan    Other medication(s) to be shipped: No additional medications requested for fill at this time     Derek Boyer, DOB: 07/12/1981  Phone: [phone] (home) [phone] (work)      All above HIPAA information was verified with patient's family member, Derek Boyer.     Was a Nurse, learning disability used for this call? No    Completed refill call assessment today to schedule patient's medication shipment from the [facility] [phone]).  All relevant notes have been reviewed.     Specialty medication(s) and dose(s) confirmed: Regimen is correct and unchanged.   Changes to medications: Derek Boyer reports no changes at this time.  Changes to insurance: No  New side effects reported not previously addressed with a pharmacist or physician: None reported  Questions for the pharmacist: No    Confirmed patient received a Conservation officer, historic buildings and a Surveyor, mining with first shipment. The patient will receive a drug information handout for each medication shipped and additional FDA Medication Guides as required.       DISEASE/MEDICATION-SPECIFIC INFORMATION        N/A    SPECIALTY MEDICATION ADHERENCE     Medication Adherence    Patient reported X missed doses in the last month: 0  Specialty Medication: xifaxan 550mg               Were doses missed due to medication being on hold? No    xifaxan 550mg   : 14 days of medicine on hand       REFERRAL TO PHARMACIST     Referral to the pharmacist: Not needed      [facility]     Shipping address confirmed in Epic.     Delivery Scheduled: Yes, Expected medication delivery date: 3/17.     Medication will be delivered via UPS to the prescription address in Epic WAM.    Derek Boyer   [facility] Specialty Technician

## 2022-01-19 DIAGNOSIS — K7460 Unspecified cirrhosis of liver: Principal | ICD-10-CM

## 2022-01-19 DIAGNOSIS — K7581 Nonalcoholic steatohepatitis (NASH): Principal | ICD-10-CM

## 2022-01-19 MED ORDER — SPIRONOLACTONE 100 MG TABLET
ORAL_TABLET | 3 refills | 0 days | Status: CP
Start: 2022-01-19 — End: ?

## 2022-01-19 MED ORDER — BACLOFEN 10 MG TABLET
ORAL_TABLET | ORAL | 5 refills | 0.00000 days | Status: CP
Start: 2022-01-19 — End: ?

## 2022-01-19 MED FILL — XIFAXAN 550 MG TABLET: ORAL | 30 days supply | Qty: 60 | Fill #1

## 2022-01-22 MED ORDER — BACLOFEN 10 MG TABLET
ORAL_TABLET | 5 refills | 0 days
Start: 2022-01-22 — End: ?

## 2022-01-24 ENCOUNTER — Ambulatory Visit: Admit: 2022-01-24 | Discharge: 2022-01-25 | Payer: PRIVATE HEALTH INSURANCE

## 2022-01-24 DIAGNOSIS — E119 Type 2 diabetes mellitus without complications: Secondary | ICD-10-CM | POA: Diagnosis not present

## 2022-01-24 DIAGNOSIS — K7682 Hepatic encephalopathy: Secondary | ICD-10-CM | POA: Diagnosis not present

## 2022-01-24 DIAGNOSIS — K7460 Unspecified cirrhosis of liver: Secondary | ICD-10-CM | POA: Diagnosis not present

## 2022-01-24 DIAGNOSIS — K7581 Nonalcoholic steatohepatitis (NASH): Secondary | ICD-10-CM | POA: Diagnosis not present

## 2022-01-24 LAB — COMPREHENSIVE METABOLIC PANEL
ALBUMIN: 2.7 g/dL — ABNORMAL LOW (ref 3.4–5.0)
ALKALINE PHOSPHATASE: 244 U/L — ABNORMAL HIGH (ref 46–116)
ALT (SGPT): 89 U/L — ABNORMAL HIGH (ref 10–49)
ANION GAP: 9 mmol/L (ref 5–14)
AST (SGOT): 186 U/L — ABNORMAL HIGH (ref <=34)
BILIRUBIN TOTAL: 7.6 mg/dL — ABNORMAL HIGH (ref 0.3–1.2)
BLOOD UREA NITROGEN: 16 mg/dL (ref 9–23)
BUN / CREAT RATIO: 20
CALCIUM: 9 mg/dL (ref 8.7–10.4)
CHLORIDE: 103 mmol/L (ref 98–107)
CO2: 20 mmol/L (ref 20.0–31.0)
CREATININE: 0.8 mg/dL
EGFR CKD-EPI (2021) MALE: >90 mL/min/{1.73_m2} (ref >=60)
GLUCOSE RANDOM: 176 mg/dL (ref 70–179)
POTASSIUM: 4.8 mmol/L (ref 3.4–4.8)
PROTEIN TOTAL: 7.5 g/dL (ref 5.7–8.2)
SODIUM: 132 mmol/L — ABNORMAL LOW (ref 135–145)

## 2022-01-24 LAB — CBC
HEMATOCRIT: 35.7 % — ABNORMAL LOW (ref 39.0–48.0)
HEMOGLOBIN: 12.6 g/dL — ABNORMAL LOW (ref 12.9–16.5)
MEAN CORPUSCULAR HEMOGLOBIN CONC: 35.2 g/dL (ref 32.0–36.0)
MEAN CORPUSCULAR HEMOGLOBIN: 34.5 pg — ABNORMAL HIGH (ref 25.9–32.4)
MEAN CORPUSCULAR VOLUME: 98.1 fL — ABNORMAL HIGH (ref 77.6–95.7)
MEAN PLATELET VOLUME: 11.7 fL — ABNORMAL HIGH (ref 6.8–10.7)
PLATELET COUNT: 111 10*9/L — ABNORMAL LOW (ref 150–450)
RED BLOOD CELL COUNT: 3.64 10*12/L — ABNORMAL LOW (ref 4.26–5.60)
RED CELL DISTRIBUTION WIDTH: 16.5 % — ABNORMAL HIGH (ref 12.2–15.2)
WBC ADJUSTED: 5.3 10*9/L (ref 3.6–11.2)

## 2022-01-24 LAB — PROTIME-INR
INR: 1.5
PROTIME: 17.2 s — ABNORMAL HIGH (ref 9.8–12.8)

## 2022-01-24 MED ADMIN — furosemide (LASIX) injection 40 mg: 40 mg | INTRAVENOUS | @ 16:00:00 | Stop: 2022-01-24

## 2022-01-24 MED ADMIN — albumin human 25 % 25 % bottle 50 g: 50 g | INTRAVENOUS | @ 15:00:00 | Stop: 2022-01-24

## 2022-01-24 NOTE — Unmapped (Signed)
1039 Patient arrived to the Transplant Infusion Room today for albumin and lasix, Condition: well; Mobility: ambulating; accompanied by spouse.   See Flowsheet and MAR for all details of visit.   1040 VS stable.  1050 PIV placed and secured with coban, labs collected and sent, urine no orders found.  1053 Infusion initiated. Patient educated to notify nursing staff if they experience urticaria, erythema, nausea, shortness of breath, nausea, metal taste in mouth, excessive oral or postnasal drainage and any other changes in status which could be side effects from initiating this medication.  1139 Infusion complete.  1140 Lasix 40 mg IV push administered.  1150 VS stable, PIV removed, patient left clinic today, Condition: well; Mobility: ambulating; accompanied by spouse.

## 2022-01-26 NOTE — Unmapped (Signed)
VM from [facility] in [place]    States that pt is now taking Baclofen 30 mg nightly per Dr. Harrel Lemon instructions    Prescription that was recently sent on 3/16 still said to take 2 tablets nightly (20 mg total)    The pharmacy needs a new prescription for Baclofen 30 mg nightly--they state that his medication is bubble packed and they need to get the updated prescription    Will route message to patient's TNC (Corrie)    Lanora Manis, RN

## 2022-01-27 DIAGNOSIS — K7460 Unspecified cirrhosis of liver: Principal | ICD-10-CM

## 2022-01-27 DIAGNOSIS — K7581 Nonalcoholic steatohepatitis (NASH): Principal | ICD-10-CM

## 2022-01-27 MED ORDER — BACLOFEN 10 MG TABLET
ORAL_TABLET | Freq: Every day | ORAL | 5 refills | 20 days | Status: CP
Start: 2022-01-27 — End: ?

## 2022-02-01 ENCOUNTER — Institutional Professional Consult (permissible substitution)
Admit: 2022-02-01 | Discharge: 2022-02-02 | Payer: PRIVATE HEALTH INSURANCE | Attending: Gastroenterology | Primary: Gastroenterology

## 2022-02-01 ENCOUNTER — Ambulatory Visit: Admit: 2022-02-01 | Discharge: 2022-02-02 | Payer: PRIVATE HEALTH INSURANCE

## 2022-02-01 DIAGNOSIS — Z129 Encounter for screening for malignant neoplasm, site unspecified: Secondary | ICD-10-CM | POA: Diagnosis not present

## 2022-02-01 DIAGNOSIS — Z7682 Awaiting organ transplant status: Secondary | ICD-10-CM | POA: Diagnosis not present

## 2022-02-01 DIAGNOSIS — C220 Liver cell carcinoma: Secondary | ICD-10-CM | POA: Diagnosis not present

## 2022-02-01 DIAGNOSIS — K766 Portal hypertension: Secondary | ICD-10-CM | POA: Diagnosis not present

## 2022-02-01 DIAGNOSIS — K7460 Unspecified cirrhosis of liver: Secondary | ICD-10-CM | POA: Diagnosis not present

## 2022-02-01 DIAGNOSIS — Z23 Encounter for immunization: Secondary | ICD-10-CM | POA: Diagnosis not present

## 2022-02-01 MED ADMIN — gadobenate dimeglumine (MULTIHANCE) 529 mg/mL (0.1mmol/0.2mL) solution 10 mL: 10 mL | INTRAVENOUS | @ 13:00:00 | Stop: 2022-02-01

## 2022-02-01 NOTE — Unmapped (Signed)
Pt came in for shingrix and hep b vaccines today. Tolerated both well and left in good health.

## 2022-02-03 DIAGNOSIS — H109 Unspecified conjunctivitis: Secondary | ICD-10-CM | POA: Diagnosis not present

## 2022-02-03 MED ORDER — ONE-A-DAY MEN'S COMPLETE 240 MCG-30 MCG-300 MCG TABLET
ORAL_TABLET | 0 refills | 0 days
Start: 2022-02-03 — End: ?

## 2022-02-07 ENCOUNTER — Ambulatory Visit: Admit: 2022-02-07 | Discharge: 2022-02-08 | Payer: PRIVATE HEALTH INSURANCE

## 2022-02-07 DIAGNOSIS — K7581 Nonalcoholic steatohepatitis (NASH): Secondary | ICD-10-CM | POA: Diagnosis not present

## 2022-02-07 DIAGNOSIS — K7460 Unspecified cirrhosis of liver: Secondary | ICD-10-CM | POA: Diagnosis not present

## 2022-02-07 DIAGNOSIS — K7682 Hepatic encephalopathy: Secondary | ICD-10-CM | POA: Diagnosis not present

## 2022-02-07 DIAGNOSIS — E119 Type 2 diabetes mellitus without complications: Secondary | ICD-10-CM | POA: Diagnosis not present

## 2022-02-07 LAB — COMPREHENSIVE METABOLIC PANEL
ALBUMIN: 2.8 g/dL — ABNORMAL LOW (ref 3.4–5.0)
ALKALINE PHOSPHATASE: 225 U/L — ABNORMAL HIGH (ref 46–116)
ALT (SGPT): 96 U/L — ABNORMAL HIGH (ref 10–49)
ANION GAP: 9 mmol/L (ref 5–14)
AST (SGOT): 204 U/L — ABNORMAL HIGH (ref <=34)
BILIRUBIN TOTAL: 10.1 mg/dL — ABNORMAL HIGH (ref 0.3–1.2)
BLOOD UREA NITROGEN: 18 mg/dL (ref 9–23)
BUN / CREAT RATIO: 23
CALCIUM: 8.6 mg/dL — ABNORMAL LOW (ref 8.7–10.4)
CHLORIDE: 103 mmol/L (ref 98–107)
CO2: 23 mmol/L (ref 20.0–31.0)
CREATININE: 0.8 mg/dL
EGFR CKD-EPI (2021) MALE: >90 mL/min/{1.73_m2} (ref >=60)
GLUCOSE RANDOM: 129 mg/dL (ref 70–179)
POTASSIUM: 4.4 mmol/L (ref 3.4–4.8)
PROTEIN TOTAL: 7.7 g/dL (ref 5.7–8.2)
SODIUM: 135 mmol/L (ref 135–145)

## 2022-02-07 LAB — PROTIME-INR
INR: 1.52
PROTIME: 17.5 s — ABNORMAL HIGH (ref 9.8–12.8)

## 2022-02-07 LAB — CBC
HEMATOCRIT: 37.7 % — ABNORMAL LOW (ref 39.0–48.0)
HEMOGLOBIN: 13.4 g/dL (ref 12.9–16.5)
MEAN CORPUSCULAR HEMOGLOBIN CONC: 35.7 g/dL (ref 32.0–36.0)
MEAN CORPUSCULAR HEMOGLOBIN: 34.9 pg — ABNORMAL HIGH (ref 25.9–32.4)
MEAN CORPUSCULAR VOLUME: 97.9 fL — ABNORMAL HIGH (ref 77.6–95.7)
MEAN PLATELET VOLUME: 11.1 fL — ABNORMAL HIGH (ref 6.8–10.7)
PLATELET COUNT: 117 10*9/L — ABNORMAL LOW (ref 150–450)
RED BLOOD CELL COUNT: 3.85 10*12/L — ABNORMAL LOW (ref 4.26–5.60)
RED CELL DISTRIBUTION WIDTH: 17 % — ABNORMAL HIGH (ref 12.2–15.2)
WBC ADJUSTED: 5.3 10*9/L (ref 3.6–11.2)

## 2022-02-07 MED ADMIN — albumin human 25 % 25 % bottle 50 g: 50 g | INTRAVENOUS | @ 15:00:00 | Stop: 2022-02-07

## 2022-02-07 MED ADMIN — furosemide (LASIX) injection 40 mg: 40 mg | INTRAVENOUS | @ 16:00:00 | Stop: 2022-02-07

## 2022-02-07 NOTE — Unmapped (Signed)
1035 Patient arrived to the Transplant Infusion Room today for albumin and lasix, Condition: well; Mobility: ambulating; accompanied by relative.   See Flowsheet and MAR for all details of visit.   1040 VS stable.  1048 PIV placed and secured with coban, labs collected and sent, urine no orders found.  1050 Infusion initiated. Patient educated to notify nursing staff if they experience urticaria, erythema, nausea, shortness of breath, nausea, metal taste in mouth, excessive oral or postnasal drainage and any other changes in status which could be side effects from initiating this medication.  1140 Infusion complete.  1141 Lasix 40 mg IV push administered.  1200 VS stable, PIV removed, patient left clinic today, Condition: well; Mobility: ambulating; accompanied by relative.

## 2022-02-09 DIAGNOSIS — K7460 Unspecified cirrhosis of liver: Principal | ICD-10-CM

## 2022-02-09 DIAGNOSIS — K7581 Nonalcoholic steatohepatitis (NASH): Principal | ICD-10-CM

## 2022-02-09 DIAGNOSIS — K7682 Hepatic encephalopathy (CMS-HCC): Principal | ICD-10-CM

## 2022-02-09 MED ORDER — LACTULOSE 10 GRAM/15 ML ORAL SOLUTION
5 refills | 0 days
Start: 2022-02-09 — End: ?

## 2022-02-16 MED ORDER — ONE-A-DAY MEN'S COMPLETE 240 MCG-30 MCG-300 MCG TABLET
ORAL_TABLET | 0 refills | 0 days | Status: CP
Start: 2022-02-16 — End: ?

## 2022-02-16 MED ORDER — LACTULOSE 10 GRAM/15 ML ORAL SOLUTION
11 refills | 0 days | Status: CP
Start: 2022-02-16 — End: 2022-03-18

## 2022-02-16 NOTE — Unmapped (Signed)
[facility] Refill Coordination Note    Specialty Medication(s) to be Shipped:   Infectious Disease: Xifaxan    Other medication(s) to be shipped: No additional medications requested for fill at this time     Derek Boyer, DOB: 10-Nov-1980  Phone: [phone] (home) [phone] (work)      All above HIPAA information was verified with patient's family member, hayley.     Was a Nurse, learning disability used for this call? No    Completed refill call assessment today to schedule patient's medication shipment from the [facility] [phone]).  All relevant notes have been reviewed.     Specialty medication(s) and dose(s) confirmed: Regimen is correct and unchanged.   Changes to medications: Derek Boyer reports no changes at this time.  Changes to insurance: No  New side effects reported not previously addressed with a pharmacist or physician: None reported  Questions for the pharmacist: No    Confirmed patient received a Conservation officer, historic buildings and a Surveyor, mining with first shipment. The patient will receive a drug information handout for each medication shipped and additional FDA Medication Guides as required.       DISEASE/MEDICATION-SPECIFIC INFORMATION        N/A    SPECIALTY MEDICATION ADHERENCE     Medication Adherence    Patient reported X missed doses in the last month: 0  Specialty Medication: xifaxan 550mg               Were doses missed due to medication being on hold? No    Unable to confirm quantity on hand    REFERRAL TO PHARMACIST     Referral to the pharmacist: Not needed      [facility]     Shipping address confirmed in Epic.     Delivery Scheduled: Yes, Expected medication delivery date: 4/19.     Medication will be delivered via UPS to the prescription address in Epic WAM.    Westley Gambles   [facility] Specialty Technician

## 2022-02-21 ENCOUNTER — Ambulatory Visit: Admit: 2022-02-21 | Discharge: 2022-02-22 | Payer: PRIVATE HEALTH INSURANCE

## 2022-02-21 DIAGNOSIS — K7210 Chronic hepatic failure without coma: Principal | ICD-10-CM

## 2022-02-21 DIAGNOSIS — K7682 Hepatic encephalopathy: Secondary | ICD-10-CM | POA: Diagnosis not present

## 2022-02-21 DIAGNOSIS — E119 Type 2 diabetes mellitus without complications: Secondary | ICD-10-CM | POA: Diagnosis not present

## 2022-02-21 DIAGNOSIS — K7460 Unspecified cirrhosis of liver: Secondary | ICD-10-CM | POA: Diagnosis not present

## 2022-02-21 DIAGNOSIS — R188 Other ascites: Secondary | ICD-10-CM | POA: Diagnosis not present

## 2022-02-21 DIAGNOSIS — K7581 Nonalcoholic steatohepatitis (NASH): Secondary | ICD-10-CM | POA: Diagnosis not present

## 2022-02-21 LAB — PROTIME-INR
INR: 1.74
PROTIME: 20.1 s — ABNORMAL HIGH (ref 9.8–12.8)

## 2022-02-21 LAB — URINALYSIS WITH MICROSCOPY
BACTERIA: NONE SEEN /HPF
BILIRUBIN UA: NEGATIVE
BLOOD UA: NEGATIVE
GLUCOSE UA: NEGATIVE
HYALINE CASTS: 1 /LPF (ref 0–1)
KETONES UA: NEGATIVE
LEUKOCYTE ESTERASE UA: NEGATIVE
NITRITE UA: NEGATIVE
PH UA: 5.5 (ref 5.0–9.0)
PROTEIN UA: NEGATIVE
RBC UA: <1 /HPF (ref <=3)
SPECIFIC GRAVITY UA: 1.007 (ref 1.003–1.030)
SQUAMOUS EPITHELIAL: <1 /HPF (ref 0–5)
UROBILINOGEN UA: <2
WBC UA: 1 /HPF (ref <=2)

## 2022-02-21 LAB — COMPREHENSIVE METABOLIC PANEL
ALBUMIN: 2.8 g/dL — ABNORMAL LOW (ref 3.4–5.0)
ALKALINE PHOSPHATASE: 171 U/L — ABNORMAL HIGH (ref 46–116)
ALT (SGPT): 88 U/L — ABNORMAL HIGH (ref 10–49)
ANION GAP: 9 mmol/L (ref 5–14)
AST (SGOT): 185 U/L — ABNORMAL HIGH (ref <=34)
BILIRUBIN TOTAL: 17.3 mg/dL — ABNORMAL HIGH (ref 0.3–1.2)
BLOOD UREA NITROGEN: 17 mg/dL (ref 9–23)
BUN / CREAT RATIO: 25
CALCIUM: 8.8 mg/dL (ref 8.7–10.4)
CHLORIDE: 103 mmol/L (ref 98–107)
CO2: 22 mmol/L (ref 20.0–31.0)
CREATININE: 0.67 mg/dL
EGFR CKD-EPI (2021) MALE: >90 mL/min/{1.73_m2} (ref >=60)
GLUCOSE RANDOM: 130 mg/dL (ref 70–179)
POTASSIUM: 4.2 mmol/L (ref 3.4–4.8)
PROTEIN TOTAL: 7.8 g/dL (ref 5.7–8.2)
SODIUM: 134 mmol/L — ABNORMAL LOW (ref 135–145)

## 2022-02-21 LAB — CBC
HEMATOCRIT: 36.4 % — ABNORMAL LOW (ref 39.0–48.0)
HEMOGLOBIN: 13.1 g/dL (ref 12.9–16.5)
MEAN CORPUSCULAR HEMOGLOBIN CONC: 36 g/dL (ref 32.0–36.0)
MEAN CORPUSCULAR HEMOGLOBIN: 35.1 pg — ABNORMAL HIGH (ref 25.9–32.4)
MEAN CORPUSCULAR VOLUME: 97.5 fL — ABNORMAL HIGH (ref 77.6–95.7)
MEAN PLATELET VOLUME: 11 fL — ABNORMAL HIGH (ref 6.8–10.7)
PLATELET COUNT: 132 10*9/L — ABNORMAL LOW (ref 150–450)
RED BLOOD CELL COUNT: 3.74 10*12/L — ABNORMAL LOW (ref 4.26–5.60)
RED CELL DISTRIBUTION WIDTH: 17.3 % — ABNORMAL HIGH (ref 12.2–15.2)
WBC ADJUSTED: 6.1 10*9/L (ref 3.6–11.2)

## 2022-02-21 MED ADMIN — albumin human 25 % 25 % bottle 50 g: 50 g | INTRAVENOUS | @ 16:00:00 | Stop: 2022-02-21

## 2022-02-21 MED FILL — XIFAXAN 550 MG TABLET: ORAL | 30 days supply | Qty: 60 | Fill #2

## 2022-02-21 NOTE — Unmapped (Signed)
1135 Patient arrived to the Transplant Infusion Room today for albumin and lasix, Condition: not well today; Mobility: ambulating; accompanied by relative.   See Flowsheet and MAR for all details of visit.   1140 VS stable.  1145 PIV placed and secured with coban, labs collected and sent, urine no orders found.  1149 Infusion initiated. Patient educated to notify nursing staff if they experience urticaria, erythema, nausea, shortness of breath, nausea, metal taste in mouth, excessive oral or postnasal drainage and any other changes in status which could be side effects from initiating this medication.  1243 Infusion complete.  1300 Dr. Piedad Climes at bedside evaluating patient  1510 Provider Vallery Sa NP with patient, Consent signed, Timeout performed.  1530 Diagnostic Paracentesis completed Body fluid sent for cell count.   1630 ok per Dr. Piedad Climes to discharge patient VS stable, PIV removed, patient left clinic today, Condition: well; Mobility: ambulating; accompanied by relative.    MELD-Na: 25 at 02/21/2022 11:32 AM  MELD: 23 at 02/21/2022 11:32 AM  Calculated from:  Serum Creatinine: 0.67 mg/dL (Using min of 1 mg/dL) at 4/54/0981 19:14 AM  Serum Sodium: 134 mmol/L at 02/21/2022 11:32 AM  Total Bilirubin: 17.3 mg/dL at 7/82/9562 13:08 AM  INR(ratio): 1.74 at 02/21/2022 11:32 AM

## 2022-02-21 NOTE — Unmapped (Signed)
MELD-Na: 25 at 02/21/2022 11:32 AM  MELD: 23 at 02/21/2022 11:32 AM  Calculated from:  Serum Creatinine: 0.67 mg/dL (Using min of 1 mg/dL) at 1/61/0960 45:40 AM  Serum Sodium: 134 mmol/L at 02/21/2022 11:32 AM  Total Bilirubin: 17.3 mg/dL at 9/81/1914 78:29 AM  INR(ratio): 1.74 at 02/21/2022 11:32 AM

## 2022-02-21 NOTE — Unmapped (Signed)
Paracentesis      Indications:   Increased abdominal girth      Procedure:  The risks and benefits of the procedure were explained and informed consent was obtained.  Refer to the consent documentation.  The head of the bed was placed 30-45 degrees above level and the meniscus of the ascites was evaluated by percussion with maneuvers.   Anatomic landmarks were used to mark the intended site of paracentesis in theright lower quadrant.  Ultrasound was used to confirm the site.     Local anesthesia with 1 percent lidocaine was introduced subcutaneously then deep to the skin until the parietal peritoneum was anesthetized. A small incision was made with a scalpel. A paracentesis needle with a catheter was introduced into this site until ascitic fluid was encountered and the catheter was introduced over the needle.  Ascitic fluid and the catheter were removed with minimal bleeding.  A sterile bandage was placed after holding pressure.  The patient tolerated the procedure well and remains in the same condition as pre-procedure.    Albumin 5%/25% 50 grams was administered at the time of paracentesis.       Complications:  none     Disposition of specimen(s)    Fluid Type:   Ascitic Fluid   Volume:     Character:   clear and amber   Sent for:   Cell count and diff and Culture

## 2022-02-21 NOTE — Unmapped (Signed)
Spoke to patient after he reported increased swelling and abdominal distention.  Seen in person by [facility], will change 5/2 appt in infusion to LVP.  Labs pending

## 2022-02-21 NOTE — Unmapped (Signed)
Listed patient Derek Boyer in [place] today with the following labs:   MELD is 25 via UNOS  [name] was noted to have the following Mild Encephalopathy and Moderate Ascites and this was noted in his MELD upgrade today.  Lab Results   Component Value Date    BILITOT 17.3 (H) 02/21/2022    INR 1.74 02/21/2022    NA 134 (L) 02/21/2022    ALBUMIN 2.8 (L) 02/21/2022    CREATININE 0.67 02/21/2022       Candidate ABO verified x 2 in UNET and patient successfully listed on the UNOS waiting list - Derek Boyer is now eligible for match runs.  Updated patient status to waitlist in EPIC.    Notified patient and family via phone patient status in UNOS on liver transplant waitlist changed to active.

## 2022-02-21 NOTE — Unmapped (Signed)
Today, February 21, 2022 at 1:54 PM, I spoke with [name] and reviewed the second half (pages 4-5) of the Patient Education Checklist for Placement on the Transplant Waiting List (HIM [id number]), with a revision date of December 01, 2019, including the following items:  The patient has received, reviewed and understands the Liver transplant educational booklet(s).   The patient has been given a copy of the [employer] for [employer] (UNOS) booklet ???Questions and Answers for Transplant Candidates and Their Families about Multiple Listing and Waiting Time Transfer.??? They understand that they may be listed at more than one center and that more testing may need to be done by the other center. They also understand that each center has different criteria and will make their own decision about adding them to the waiting list.   Transplant surgery is an elective procedure that has risks. They have been told about the risks of transplant surgery specific to their organ and the future risks related to the use of transplant medications.   The surgical procedure has been explained to them by a transplant surgeon including:  Surgery procedure to be performed  Surgical risks specific to that procedure  How the surgery is expected to impact health or quality of life  Expected length of hospitalization following surgery  Expected recovery period  When normal daily activities may be resumed  The transplant team is not able to predict when the next organ may become available.  The patient has the right to refuse transplant at any time. It will not affect their listing status if they refuse an organ offer.  If the patient is added to the transplant waiting list, they must meet the goals of the transplant team. The goals will be explained to the patient. If they are not able to fulfill these goals, then they may be removed from the waiting list.   The patient understands the need to take medicines to prevent infection, rejection, and to treat other health problems like high blood pressure or high blood sugar. They will not change their medications without talking to their transplant nurse coordinator or transplant doctor.   The patient understands the need for ongoing care after transplant. This care includes but is not limited to biopsies, medicines, lab work and return visits to the [facility] for Transplant Care. They verbalized the importance of following the order of their transplant team and will call the team if they are not able to follow through with the treatment plan given to them.  The patient has received information about and understands the financial responsibilities of transplant including, but not limited to out of pocket costs, medication costs, insurance requirements, and fundraising options.   The patient understands that if they do not receive a transplant at a Medicare certified facility, they may not be able to obtain Medicare benefits (Part B) for medicines that prevent rejection.  The patient understands that all transplant programs are staffed by Physicians, Surgeons, and Transplant Nurse Coordinators, 24 hours a day, 7 days a week, 365 days a year. They understand that [place] transplant program is available to accept organ offers on their behalf, perform transplants, and provide post-transplant care. They further understand that [facility] has a designated Paramedic and Primary Physician for each organ transplant program, but none of its programs are a single surgeon program so back up is available.   The patient understands that their daily routine may be disrupted with little or no warning by a  catastrophic event, such as an earthquake, tornado, or flood. Help might not always be available so emergency preparedness is critical. They understand that having an emergency plan in place is especially important since they have a chronic illness, may be an organ transplant recipient, or have special medical needs such as oxygen or equipment that is supported by power. They agree to consider the following while creating their emergency plan:  Making an emergency supply kit  Keeping an updated medicine list and one month supply of medications on hand if possible  Creating a personal evacuation plan  Collecting important personal information  Making sure their health care team knows how to reach them  If they have a question regarding medical care, they understand to call [facility] for Transplant Care first. However, for general organ transplant-related information, they understand they can call the [place] [facility] for [employer] (UNOS) toll-free patient services line at [phone].     The patient or their authorized representative verbalized understanding and agreement of the informed consent elements discussed. They were given an opportunity to ask questions and declined.  All questions have been answered. [name] verbalized agreement that, if approved for transplant by the [facility] for Transplant Care Team, they would like to be added to the UNOS Transplant Waiting List.     I informed [name] that a second transplant team member will be calling shortly to verify that they've received the information and confirm all of their questions had been answered. The patient has also been advised that they can contact their Transplant Nurse Coordinator at any time if they have more questions.      Deangela Randleman Nadyne Coombes, RN February 21, 2022 1:54 PM  [facility] for Transplant [facility]  [facility]

## 2022-02-21 NOTE — Unmapped (Signed)
Today on February 21, 2022 at 2:18 PM I spoke with Derek Boyer and confirmed that they had received education today from [name], [place]. The patient had no additional questions and verbalized agreement that if approved as a candidate he be added to the UNOS Liver transplant waiting list.      [name], RN February 21, 2022 2:18 PM  [facility] for Transplant Care  [facility]

## 2022-02-22 DIAGNOSIS — K7210 Chronic hepatic failure without coma: Principal | ICD-10-CM

## 2022-02-22 DIAGNOSIS — Z7682 Awaiting organ transplant status: Principal | ICD-10-CM

## 2022-02-22 LAB — BODY FLUID, PATH REVIEW

## 2022-02-22 NOTE — Unmapped (Signed)
Spoke to wife to update her on plan of care. Reviewed reasons to contact on call coordinator, lab schedule, and expected wait time for offer.  Discussed letting primary coordinator know when travel is planned.  She verbalized understanding.

## 2022-02-23 NOTE — Unmapped (Signed)
Call placed to [facility] regarding an appt with Dr. Piedad Climes.  [name] verbally agreed 4/24 and aware which location he should report to.

## 2022-02-24 DIAGNOSIS — R197 Diarrhea, unspecified: Principal | ICD-10-CM

## 2022-02-24 DIAGNOSIS — K7210 Chronic hepatic failure without coma: Principal | ICD-10-CM

## 2022-02-24 DIAGNOSIS — Z7682 Awaiting organ transplant status: Principal | ICD-10-CM

## 2022-02-24 NOTE — Unmapped (Signed)
On call page received from Derek Boyer - states he has intermittent diarrhea X 24 hours, now somewhat lessened. No abd pain, feer etc. Instructed to hold lactulose today and restart in am - also advised to contact on call if diarrhea worsened or other sx appeared. He stated understanding.    Orders placed for collection @ [place] (appt Monday) for GI panel / c-diff if diarrhea ongoing as well as MELD labs.

## 2022-02-27 ENCOUNTER — Ambulatory Visit: Admit: 2022-02-27 | Discharge: 2022-02-28 | Payer: PRIVATE HEALTH INSURANCE | Attending: Surgery | Primary: Surgery

## 2022-02-27 ENCOUNTER — Ambulatory Visit
Admit: 2022-02-27 | Discharge: 2022-02-28 | Payer: PRIVATE HEALTH INSURANCE | Attending: Gastroenterology | Primary: Gastroenterology

## 2022-02-27 DIAGNOSIS — Z7682 Awaiting organ transplant status: Principal | ICD-10-CM

## 2022-02-27 DIAGNOSIS — K7210 Chronic hepatic failure without coma: Principal | ICD-10-CM

## 2022-02-27 DIAGNOSIS — R197 Diarrhea, unspecified: Principal | ICD-10-CM

## 2022-02-27 DIAGNOSIS — Z79899 Other long term (current) drug therapy: Secondary | ICD-10-CM | POA: Diagnosis not present

## 2022-02-27 DIAGNOSIS — Z6830 Body mass index (BMI) 30.0-30.9, adult: Secondary | ICD-10-CM | POA: Diagnosis not present

## 2022-02-27 DIAGNOSIS — Z7984 Long term (current) use of oral hypoglycemic drugs: Secondary | ICD-10-CM | POA: Diagnosis not present

## 2022-02-27 DIAGNOSIS — E119 Type 2 diabetes mellitus without complications: Secondary | ICD-10-CM | POA: Diagnosis not present

## 2022-02-27 LAB — COMPREHENSIVE METABOLIC PANEL
ALBUMIN: 3 g/dL — ABNORMAL LOW (ref 3.4–5.0)
ALKALINE PHOSPHATASE: 174 U/L — ABNORMAL HIGH (ref 46–116)
ALT (SGPT): 89 U/L — ABNORMAL HIGH (ref 10–49)
ANION GAP: 12 mmol/L (ref 5–14)
AST (SGOT): 191 U/L — ABNORMAL HIGH (ref <=34)
BILIRUBIN TOTAL: 24.1 mg/dL — ABNORMAL HIGH (ref 0.3–1.2)
CALCIUM: 9.5 mg/dL (ref 8.7–10.4)
CHLORIDE: 101 mmol/L (ref 98–107)
CO2: 20 mmol/L (ref 20.0–31.0)
CREATININE: 0.97 mg/dL
EGFR CKD-EPI (2021) MALE: >90 mL/min/{1.73_m2} (ref >=60)
GLUCOSE RANDOM: 124 mg/dL (ref 70–179)
POTASSIUM: 4.5 mmol/L (ref 3.4–4.8)
PROTEIN TOTAL: 8.2 g/dL (ref 5.7–8.2)
SODIUM: 133 mmol/L — ABNORMAL LOW (ref 135–145)

## 2022-02-27 LAB — PROTIME-INR
INR: 1.72
PROTIME: 19.9 s — ABNORMAL HIGH (ref 9.8–12.8)

## 2022-02-27 NOTE — Unmapped (Signed)
Transplant Surgery History and Physical      Assessment/Recommendations:    Derek Boyer is a 41 y.o. male seen in consultation at the request of Dr Piedad Climes for evaluation of candidacy for transplantation.    I spent 20 minutes with the patient obtaining the above history and physical examination, and greater than 50% of the time was spent counseling and on the substance of the discussion.    Derek Boyer had all their questions answered and wishes to proceed with the liver transplant evaluation process.    This patient was seen and evaluated with Dr. Lucianne Muss with no contraindication for transplant from a surgical perspective.      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    His MELD jumped recently from 19 to 25 due to an increase in bilirubin  To 17.     MELD-Na: 25 at 02/21/2022 11:32 AM  MELD: 23 at 02/21/2022 11:32 AM  Calculated from:  Serum Creatinine: 0.67 mg/dL (Using min of 1 mg/dL) at 1/61/0960 45:40 AM  Serum Sodium: 134 mmol/L at 02/21/2022 11:32 AM  Total Bilirubin: 17.3 mg/dL at 9/81/1914 78:29 AM  INR(ratio): 1.74 at 02/21/2022 11:32 AM      He is otherwise in good health and his cardiac investigations and MRI are all recent and acceptable. His main portal vein is patent and no evidence of malignancy.     He showed no signs of Hepatic encephalopathy and his ascites is very mild.     Overall he seems like a suitable candidate for activation and no contraindication for liver transplantation.    Allergies    Other      Medications      Current Outpatient Medications   Medication Sig Dispense Refill   ??? azithromycin (ZITHROMAX) 250 MG tablet Take 500 mg by mouth daily. Z pak: 2 tablets day 1; then 1 tablet daily x 4 days.     ??? baclofen (LIORESAL) 10 MG tablet Take 3 tablets (30 mg total) by mouth in the morning. 60 tablet 5   ??? furosemide (LASIX) 20 MG tablet Take 3 tablets (60 mg total) by mouth daily. 90 tablet 3   ??? lactulose 10 gram/15 mL solution take (20g total) BY MOUTH THREE TIMES DAILY 900 mL 11   ??? magnesium oxide (MAG-OX) 400 mg (241.3 mg elemental magnesium) tablet TAKE 1 Tablet BY MOUTH ONCE DAILY 90 tablet 3   ??? metFORMIN (GLUCOPHAGE-XR) 500 MG 24 hr tablet Take 500 mg by mouth daily with evening meal.      ??? multivitamin/iron/folic acid (CENTRUM ORAL)      ??? omeprazole (PRILOSEC) 20 MG capsule TAKE ONE CAPSULE BY MOUTH EVERY MORNING BEFORE BREAKFAST 90 capsule 1   ??? ONE-A-DAY MEN'S COMPLETE 240 mcg-30 mcg- 300 mcg Tab take 1 Tablet by mouth at bedtime 100 tablet 0   ??? rifAXIMin (XIFAXAN) 550 mg Tab Take 1 tablet (550 mg total) by mouth Two (2) times a day. 180 tablet 3   ??? spironolactone (ALDACTONE) 100 MG tablet take 1 Tablet by mouth in the morning (take with 50mg  tablet TO equal 150mg  total) 30 tablet 3   ??? zinc gluconate 50 mg (7 mg elemental zinc) tablet Take 50 mg by mouth daily. Taking twice a day       No current facility-administered medications for this visit.  Past Medical History    Past Medical History:   Diagnosis Date   ??? Diabetes mellitus (CMS-HCC)          Past Surgical History    Past Surgical History:   Procedure Laterality Date   ??? PR UPPER GI ENDOSCOPY,DIAGNOSIS N/A 09/10/2020    Procedure: UGI ENDO, INCLUDE ESOPHAGUS, STOMACH, & DUODENUM &/OR JEJUNUM; DX W/WO COLLECTION SPECIMN, BY BRUSH OR WASH;  Surgeon: Janyth Pupa, MD;  [facility] [place];  Service: Gastroenterology     Laparoscopic cholecystectomy in 2018    Family History    The patient's family history is not on file..      Social History:    Tobacco use: denies  Alcohol use: denies  Drug use: denies      Review of Systems    A 12 system review of systems was negative except as noted in HPI    Objective     PE: Blood pressure 140/78, pulse 71, temperature 37 ??C (98.6 ??F), temperature source Tympanic, height 170.2 cm (5' 7.01), weight 88.9 kg (196 lb). Body mass index is 30.69 kg/m??.  General: No Focal neurological deficit  Lungs: clear to auscultation, percussion to the bases, and unlabored breathing  Heart: euvolemic, regular rate and rhythm, normal S1 and S2, no murmur  Abd: soft, non-distended, non-tender, no organomegaly or masses  Ascites: mild   Skin: jaundice  Ext: no edema, well perfused  Neuro: non-focal exam. thought organized, appropriate affect, normal fluent speech        Test Results    Labs:  All lab results last 24 hours:  No results found for this or any previous visit (from the past 24 hour(s)).    Imaging: Reviewed and found acceptable.    Dr Mathis Dad  Fellow Transplant Division  Department of Surgery  [facility]  Page - [phone]

## 2022-02-27 NOTE — Unmapped (Signed)
Updated [name] MELD score in [place] today with the following labs:  Recertification of  MELD is 26 via UNOS    Mr. [name] was also noted to have the following Mild Encephalopathy and Controlled Ascites and this was noted in his MELD upgrade today.  Lab Results   Component Value Date    CREATININE 0.97 02/27/2022    NA 133 (L) 02/27/2022    BILITOT 24.1 (H) 02/27/2022    ALBUMIN 3.0 (L) 02/27/2022    INR 1.72 02/27/2022

## 2022-03-01 NOTE — Unmapped (Signed)
Insurance has been verified.   ??  ??Patient has active coverage with??[employer] [place], including prescription drug coverage via??[employer] [phone]. Patient also has [employer] per Britta Mccreedy in contracts this is not insurance??and will only use patients primacy coverage of [employer]??  ??  Authorization is??approved??for Liver transplant listing valid until 08/06/22.   ??  ??Patient is financially cleared for Liver transplant lisitng

## 2022-03-04 NOTE — Unmapped (Signed)
error 

## 2022-03-06 DIAGNOSIS — K7210 Chronic hepatic failure without coma: Principal | ICD-10-CM

## 2022-03-06 DIAGNOSIS — Z7682 Awaiting organ transplant status: Principal | ICD-10-CM

## 2022-03-07 ENCOUNTER — Ambulatory Visit: Admit: 2022-03-07 | Discharge: 2022-03-08 | Payer: PRIVATE HEALTH INSURANCE

## 2022-03-07 DIAGNOSIS — K7682 Hepatic encephalopathy: Secondary | ICD-10-CM | POA: Diagnosis not present

## 2022-03-07 DIAGNOSIS — R110 Nausea: Secondary | ICD-10-CM | POA: Diagnosis not present

## 2022-03-07 DIAGNOSIS — K7460 Unspecified cirrhosis of liver: Secondary | ICD-10-CM | POA: Diagnosis not present

## 2022-03-07 DIAGNOSIS — E119 Type 2 diabetes mellitus without complications: Secondary | ICD-10-CM | POA: Diagnosis not present

## 2022-03-07 DIAGNOSIS — K7581 Nonalcoholic steatohepatitis (NASH): Secondary | ICD-10-CM | POA: Diagnosis not present

## 2022-03-07 LAB — COMPREHENSIVE METABOLIC PANEL
ALBUMIN: 2.3 g/dL — ABNORMAL LOW (ref 3.4–5.0)
ALKALINE PHOSPHATASE: 220 U/L — ABNORMAL HIGH (ref 46–116)
ALT (SGPT): 113 U/L — ABNORMAL HIGH (ref 10–49)
AST (SGOT): 227 U/L — ABNORMAL HIGH (ref <=34)
BILIRUBIN TOTAL: 33.9 mg/dL — ABNORMAL HIGH (ref 0.3–1.2)
CALCIUM: 9.3 mg/dL (ref 8.7–10.4)
CHLORIDE: 99 mmol/L (ref 98–107)
CREATININE: 0.75 mg/dL
EGFR CKD-EPI (2021) MALE: >90 mL/min/{1.73_m2} (ref >=60)
POTASSIUM: 4.4 mmol/L (ref 3.4–4.8)
PROTEIN TOTAL: 7.3 g/dL (ref 5.7–8.2)
SODIUM: 129 mmol/L — ABNORMAL LOW (ref 135–145)

## 2022-03-07 LAB — URINALYSIS WITH MICROSCOPY
BLOOD UA: NEGATIVE
GLUCOSE UA: NEGATIVE
GRANULAR CASTS: 1 /LPF — ABNORMAL HIGH (ref <=0)
HYALINE CASTS: 11 /LPF — ABNORMAL HIGH (ref 0–1)
KETONES UA: NEGATIVE
LEUKOCYTE ESTERASE UA: NEGATIVE
NITRITE UA: NEGATIVE
PH UA: 5.5 (ref 5.0–9.0)
PROTEIN UA: NEGATIVE
RBC UA: <1 /HPF (ref <=3)
SPECIFIC GRAVITY UA: 1.01 (ref 1.003–1.030)
SQUAMOUS EPITHELIAL: <1 /HPF (ref 0–5)
UROBILINOGEN UA: <2
WBC UA: 1 /HPF (ref <=2)

## 2022-03-07 LAB — CBC
HEMATOCRIT: 41.7 % (ref 39.0–48.0)
HEMOGLOBIN: 14.7 g/dL (ref 12.9–16.5)
MEAN CORPUSCULAR HEMOGLOBIN CONC: 35.2 g/dL (ref 32.0–36.0)
MEAN CORPUSCULAR HEMOGLOBIN: 34 pg — ABNORMAL HIGH (ref 25.9–32.4)
MEAN CORPUSCULAR VOLUME: 96.6 fL — ABNORMAL HIGH (ref 77.6–95.7)
MEAN PLATELET VOLUME: 11.3 fL — ABNORMAL HIGH (ref 6.8–10.7)
PLATELET COUNT: 170 10*9/L (ref 150–450)
RED BLOOD CELL COUNT: 4.32 10*12/L (ref 4.26–5.60)
RED CELL DISTRIBUTION WIDTH: 19.3 % — ABNORMAL HIGH (ref 12.2–15.2)
WBC ADJUSTED: 9.8 10*9/L (ref 3.6–11.2)

## 2022-03-07 LAB — PROTIME-INR
INR: 1.8
PROTIME: 20.8 s — ABNORMAL HIGH (ref 9.8–12.8)

## 2022-03-07 MED ORDER — ONDANSETRON 4 MG DISINTEGRATING TABLET
ORAL_TABLET | Freq: Three times a day (TID) | ORAL | 3 refills | 10 days | Status: CP | PRN
Start: 2022-03-07 — End: ?

## 2022-03-07 MED ADMIN — albumin human 25 % 25 % bottle 50 g: 50 g | INTRAVENOUS | @ 15:00:00 | Stop: 2022-03-07

## 2022-03-07 MED ADMIN — sodium chloride 0.45% (1/2 NS) infusion: 500 mL | INTRAVENOUS | @ 17:00:00 | Stop: 2022-03-07

## 2022-03-07 NOTE — Unmapped (Signed)
MELD-Na: 30 at 03/07/2022 11:01 AM  MELD: 26 at 03/07/2022 11:01 AM  Calculated from:  Serum Creatinine: 0.75 mg/dL (Using min of 1 mg/dL) at 11/11/1094 04:54 AM  Serum Sodium: 129 mmol/L at 03/07/2022 11:01 AM  Total Bilirubin: 33.9 mg/dL at 0/07/8118 14:78 AM  INR(ratio): 1.80 at 03/07/2022 11:01 AM

## 2022-03-07 NOTE — Unmapped (Signed)
Patient having Nausea and Vomiting for several days.     No fluid pockets on POCUS today.  Trace ascites,  not enough for Dx Tap.     Will prescribe Zofran.   Will get a UA      Getting IV albumin today.    Will also give bolus 1/2NS for dehydration    Discussed with Dr Ruffin Frederick      MELD-Na: 30 at 03/07/2022 11:01 AM  MELD: 26 at 03/07/2022 11:01 AM  Calculated from:  Serum Creatinine: 0.75 mg/dL (Using min of 1 mg/dL) at 03/11/2129 86:57 AM  Serum Sodium: 129 mmol/L at 03/07/2022 11:01 AM  Total Bilirubin: 33.9 mg/dL at 06/09/6961 95:28 AM  INR(ratio): 1.80 at 03/07/2022 11:01 AM

## 2022-03-07 NOTE — Unmapped (Signed)
1105 Patient arrived to the Transplant Infusion Room today for LVP, Condition: not well today, Mobility: ambulating, accompanied by spouse.  See Flowsheet and MAR for all details of visit.    1110 VS stable.  1122 PIV placed and secured with coban, labs collected and sent; urine collected and sent.  1123 Albumin Infusion initiated.  1135 Provider Vallery Sa NP with patient, LVP not indicated per provider  1216 Albumin Infusion complete.  1233 0.45%NSS IV started as ordered  1304 Infusion completed  1412 VS stable, PIV removed and site secured with coban. Patient left clinic today, Condition: well, Mobility: ambulating, accompanied by spouse.     MELD-Na: 30 at 03/07/2022 11:01 AM  MELD: 26 at 03/07/2022 11:01 AM  Calculated from:  Serum Creatinine: 0.75 mg/dL (Using min of 1 mg/dL) at 06/06/1913 78:29 AM  Serum Sodium: 129 mmol/L at 03/07/2022 11:01 AM  Total Bilirubin: 33.9 mg/dL at 03/11/2129 86:57 AM  INR(ratio): 1.80 at 03/07/2022 11:01 AM

## 2022-03-07 NOTE — Unmapped (Signed)
Updated [name] MELD score in [place] today with the following labs:  Recertification of  MELD is 30 via UNOS    Mr. [name] was also noted to have the following Mild Encephalopathy and Controlled Ascites and this was noted in his MELD upgrade today.  Lab Results   Component Value Date    CREATININE 0.75 03/07/2022    NA 129 (L) 03/07/2022    BILITOT 33.9 (H) 03/07/2022    ALBUMIN 2.3 (L) 03/07/2022    INR 1.80 03/07/2022

## 2022-03-08 DIAGNOSIS — K7210 Chronic hepatic failure without coma: Principal | ICD-10-CM

## 2022-03-08 DIAGNOSIS — K7581 Nonalcoholic steatohepatitis (NASH): Principal | ICD-10-CM

## 2022-03-08 DIAGNOSIS — K7460 Unspecified cirrhosis of liver: Principal | ICD-10-CM

## 2022-03-08 MED ORDER — FUROSEMIDE 20 MG TABLET
ORAL_TABLET | 3 refills | 0 days
Start: 2022-03-08 — End: ?

## 2022-03-09 ENCOUNTER — Ambulatory Visit
Admit: 2022-03-09 | Discharge: 2022-03-11 | Disposition: A | Discharge status: Home / Self Care | Payer: PRIVATE HEALTH INSURANCE

## 2022-03-09 DIAGNOSIS — K7210 Chronic hepatic failure without coma: Principal | ICD-10-CM

## 2022-03-09 DIAGNOSIS — K7460 Unspecified cirrhosis of liver: Secondary | ICD-10-CM | POA: Diagnosis not present

## 2022-03-09 DIAGNOSIS — K7581 Nonalcoholic steatohepatitis (NASH): Secondary | ICD-10-CM | POA: Diagnosis not present

## 2022-03-09 DIAGNOSIS — K7469 Other cirrhosis of liver: Secondary | ICD-10-CM | POA: Diagnosis not present

## 2022-03-09 DIAGNOSIS — E119 Type 2 diabetes mellitus without complications: Secondary | ICD-10-CM | POA: Diagnosis not present

## 2022-03-09 DIAGNOSIS — R0600 Dyspnea, unspecified: Secondary | ICD-10-CM | POA: Diagnosis not present

## 2022-03-09 DIAGNOSIS — Z7682 Awaiting organ transplant status: Secondary | ICD-10-CM | POA: Diagnosis not present

## 2022-03-09 DIAGNOSIS — E669 Obesity, unspecified: Secondary | ICD-10-CM | POA: Diagnosis not present

## 2022-03-09 DIAGNOSIS — I8510 Secondary esophageal varices without bleeding: Secondary | ICD-10-CM | POA: Diagnosis not present

## 2022-03-09 DIAGNOSIS — D6859 Other primary thrombophilia: Secondary | ICD-10-CM | POA: Diagnosis not present

## 2022-03-09 DIAGNOSIS — K7682 Hepatic encephalopathy: Secondary | ICD-10-CM | POA: Diagnosis not present

## 2022-03-09 DIAGNOSIS — Z7984 Long term (current) use of oral hypoglycemic drugs: Secondary | ICD-10-CM | POA: Diagnosis not present

## 2022-03-09 DIAGNOSIS — K7290 Hepatic failure, unspecified without coma: Secondary | ICD-10-CM | POA: Diagnosis not present

## 2022-03-09 DIAGNOSIS — E871 Hypo-osmolality and hyponatremia: Secondary | ICD-10-CM | POA: Diagnosis not present

## 2022-03-09 DIAGNOSIS — K766 Portal hypertension: Secondary | ICD-10-CM | POA: Diagnosis not present

## 2022-03-09 DIAGNOSIS — I878 Other specified disorders of veins: Secondary | ICD-10-CM | POA: Diagnosis not present

## 2022-03-09 DIAGNOSIS — Z6830 Body mass index (BMI) 30.0-30.9, adult: Secondary | ICD-10-CM | POA: Diagnosis not present

## 2022-03-09 LAB — URINALYSIS WITH MICROSCOPY
BACTERIA: NONE SEEN /HPF
BLOOD UA: NEGATIVE
GLUCOSE UA: NEGATIVE
HYALINE CASTS: 3 /LPF — ABNORMAL HIGH (ref 0–1)
KETONES UA: NEGATIVE
LEUKOCYTE ESTERASE UA: NEGATIVE
NITRITE UA: NEGATIVE
PH UA: 5 (ref 5.0–9.0)
PROTEIN UA: NEGATIVE
RBC UA: <1 /HPF (ref <=3)
SPECIFIC GRAVITY UA: 1.007 (ref 1.003–1.030)
SQUAMOUS EPITHELIAL: <1 /HPF (ref 0–5)
UROBILINOGEN UA: <2
WBC UA: 1 /HPF (ref <=2)

## 2022-03-09 LAB — COMPREHENSIVE METABOLIC PANEL
ALBUMIN: 3.3 g/dL — ABNORMAL LOW (ref 3.4–5.0)
ALKALINE PHOSPHATASE: 203 U/L — ABNORMAL HIGH (ref 46–116)
ALT (SGPT): 136 U/L — ABNORMAL HIGH (ref 10–49)
AST (SGOT): 271 U/L — ABNORMAL HIGH (ref <=34)
BILIRUBIN TOTAL: 34.4 mg/dL — ABNORMAL HIGH (ref 0.3–1.2)
CALCIUM: 9.6 mg/dL (ref 8.7–10.4)
CHLORIDE: 99 mmol/L (ref 98–107)
CREATININE: 0.96 mg/dL
EGFR CKD-EPI (2021) MALE: >90 mL/min/{1.73_m2} (ref >=60)
POTASSIUM: 4.8 mmol/L (ref 3.4–4.8)
PROTEIN TOTAL: 8.8 g/dL — ABNORMAL HIGH (ref 5.7–8.2)
SODIUM: 127 mmol/L — ABNORMAL LOW (ref 135–145)

## 2022-03-09 LAB — CBC W/ AUTO DIFF
BASOPHILS ABSOLUTE COUNT: 0 10*9/L (ref 0.0–0.1)
BASOPHILS RELATIVE PERCENT: 0.2 %
EOSINOPHILS ABSOLUTE COUNT: 0.1 10*9/L (ref 0.0–0.5)
EOSINOPHILS RELATIVE PERCENT: 1.1 %
HEMATOCRIT: 42.2 % (ref 39.0–48.0)
HEMOGLOBIN: 14.9 g/dL (ref 12.9–16.5)
LYMPHOCYTES ABSOLUTE COUNT: 0.6 10*9/L — ABNORMAL LOW (ref 1.1–3.6)
LYMPHOCYTES RELATIVE PERCENT: 4.7 %
MEAN CORPUSCULAR HEMOGLOBIN CONC: 35.3 g/dL (ref 32.0–36.0)
MEAN CORPUSCULAR HEMOGLOBIN: 34.4 pg — ABNORMAL HIGH (ref 25.9–32.4)
MEAN CORPUSCULAR VOLUME: 97.4 fL — ABNORMAL HIGH (ref 77.6–95.7)
MEAN PLATELET VOLUME: 10.7 fL (ref 6.8–10.7)
MONOCYTES ABSOLUTE COUNT: 1.3 10*9/L — ABNORMAL HIGH (ref 0.3–0.8)
MONOCYTES RELATIVE PERCENT: 11 %
NEUTROPHILS ABSOLUTE COUNT: 10.1 10*9/L — ABNORMAL HIGH (ref 1.8–7.8)
NEUTROPHILS RELATIVE PERCENT: 83 %
PLATELET COUNT: 160 10*9/L (ref 150–450)
RED BLOOD CELL COUNT: 4.33 10*12/L (ref 4.26–5.60)
RED CELL DISTRIBUTION WIDTH: 19.4 % — ABNORMAL HIGH (ref 12.2–15.2)
WBC ADJUSTED: 12.2 10*9/L — ABNORMAL HIGH (ref 3.6–11.2)

## 2022-03-09 LAB — SLIDE REVIEW

## 2022-03-09 LAB — PROTIME-INR
INR: 2.16
PROTIME: 25.2 s — ABNORMAL HIGH (ref 9.8–12.8)

## 2022-03-09 LAB — HEMOGLOBIN A1C
ESTIMATED AVERAGE GLUCOSE: 77 mg/dL
HEMOGLOBIN A1C: 4.3 % — ABNORMAL LOW (ref 4.8–5.6)

## 2022-03-09 LAB — BILIRUBIN, DIRECT: BILIRUBIN DIRECT: >22.5 mg/dL — ABNORMAL HIGH (ref 0.00–0.30)

## 2022-03-09 MED ADMIN — lactulose oral solution: 30 g | ORAL | @ 23:00:00

## 2022-03-09 MED ADMIN — pantoprazole (PROTONIX) EC tablet 20 mg: 20 mg | ORAL | @ 23:00:00

## 2022-03-09 NOTE — Unmapped (Signed)
May 3 Patient's wife Hayley called yesterday to report worsening symptoms, that over the phone, sound like worsening encephalopathy.  He is using profanity, which is totally  out of character, and was up all night.  He is afebrile, denies chills, abdominal pain.  He had not had a bowel movement as of 11am.  Instructed Hayley to increase his lactulose, and give it every two hours until he starting having bowel movements.  Discussed patient in selection, specifically sharp upward trend in bilirubin and MELD score.  Patient was seen in the infusion room on Monday by Vallery Sa.       May 4.  Spoke to [place] and [place]: per Dr. Eudelia Bunch, he needs urgent evaluation, will go to the ED.  Spoke to patient and his wife, they are agreeable to this plan and will head to the [facility] ED this morning.

## 2022-03-09 NOTE — Unmapped (Signed)
Pt here per Transplant coordinator for tests and [place]. Pt on liver transplant list.

## 2022-03-09 NOTE — Unmapped (Signed)
[facility]   Emergency Department Provider Note    ED Clinical Impression     Final diagnoses:   Decompensated hepatic cirrhosis (CMS-HCC) (Primary)       Initial Impression, ED Course, Assessment and Plan       Mar 09, 2022 3:58 PM   Derek Boyer is a 41 y.o. male with pmh notable for liver cirrhosis secondary to [facility], type 2 diabetes, who presents to the [facility] emergency department for evaluation of concern for worsening liver function, general malaise, and altered mental status yesterday as described below in the HPI    On exam patient is normal vital signs.  He is ill-appearing, very jaundiced, scleral icterus noted.  He is alert and oriented, no concerns for hepatic encephalopathy as he has no confusion, no altered mental status today.    Given severely worsening jaundice and episode of confusion and altered mental status yesterday, concern for worsening liver function.  Patient was sent to the emergency department by his liver transplant team given concerns for decompensated liver cirrhosis.  Patient is not have hepatic encephalopathy today clinically.  No concerns for hyperammonemia.    Do feel that he is likely having decompensated cirrhosis with somewhat unclear etiology of this.  He will require admission in all likelihood for further investigation of this.  However, will evaluate in the emergency department with CBC, CMP, PT/INR for anemia, electrolyte abnormalities, liver function.  We will otherwise evaluate the liver with liver Doppler and hepatitis panel.    SBP as a possible diagnosis although the patient is not febrile here and does not report any abdominal pain and does not have tenderness.  However this does not exclude the diagnosis.  Patient did not have a tappable pocket yesterday in clinic, will see if he has any evidence of fluid on the liver Doppler.  Suspect that he does not have a tappable pocket as he is not distended and does not seem to have significant fluid on clinical exam.  Will defer to inpatient team as giving dose of ceftriaxone in the emergency department prophylactically would potentially indicate the patient for lifelong SBP prophylaxis.    Anticipating admission.    BP 141/75  - Pulse 68  - Temp 36.7 ??C (98.1 ??F) (Oral)  - Resp 18  - Wt 87.2 kg (192 lb 3.9 oz)  - SpO2 100%  - BMI 30.10 kg/m??       ED Course as of 03/14/22 1322   Thu Mar 09, 2022   1605 MAO paged for admission          The case was discussed with attending physician who is in agreement with the above assessment and plan    Additional Medical Decision Making     External Records Reviewed: I have reviewed recent and relevant previous record, including: Outpatient notes - Evaluated progress note from 03/07/2022 for recent medical history.        Portions of this record have been created using Scientist, clinical (histocompatibility and immunogenetics). Dictation errors have been sought, but may not have been identified and corrected.  ____________________________________________       History     Chief Complaint  Medical Problem        HPI: Derek Boyer is a 41 y.o. male who presents to the [facility] emergency department for evaluation of possible decompensated liver cirrhosis.  Patient is accompanied by his partner who provides additional history.  She reports that yesterday the patient had an episode of altered mental status  where he seemed confused, somewhat agitated.  She called the patient's liver transplant team and they advised that he present to the emergency department for further evaluation.  Patient reports today feeling malaised but otherwise patient's partner has not noticed any altered mental status or abnormal behavior.  She has noted that over the last 1 week his jaundice has gotten markedly worse, particularly his scleral icterus.  Otherwise he does not report fever, vomiting, abdominal pain, diarrhea, dysuria, melena, hematochezia.  He has been compliant with lactulose and other medications.      PAST MEDICAL HISTORY/PAST SURGICAL HISTORY:   Past Medical History:   Diagnosis Date    Diabetes mellitus (CMS-HCC)        Past Surgical History:   Procedure Laterality Date    PR UPPER GI ENDOSCOPY,DIAGNOSIS N/A 09/10/2020    Procedure: UGI ENDO, INCLUDE ESOPHAGUS, STOMACH, & DUODENUM &/OR JEJUNUM; DX W/WO COLLECTION SPECIMN, BY BRUSH OR WASH;  Surgeon: Janyth Pupa, MD;  Location: [facility];  Service: Gastroenterology       MEDICATIONS:   No current facility-administered medications for this encounter.    Current Outpatient Medications:     baclofen (LIORESAL) 10 MG tablet, Take 3 tablets (30 mg total) by mouth in the morning., Disp: 60 tablet, Rfl: 5    furosemide (LASIX) 20 MG tablet, TAKE 1 Tablet BY MOUTH ONCE DAILY (take 1 with furosemide 40mg  tablet TO equal 60mg  total daily), Disp: 30 tablet, Rfl: 3    lactulose 10 gram/15 mL solution, Take 45 mL (30 g total) by mouth Three (3) times a day. Titrate for 4-5 bowel movements per day, Disp: 900 mL, Rfl: 11    magnesium oxide (MAG-OX) 400 mg (241.3 mg elemental magnesium) tablet, TAKE 1 Tablet BY MOUTH ONCE DAILY, Disp: 90 tablet, Rfl: 3    metFORMIN (GLUCOPHAGE-XR) 500 MG 24 hr tablet, Take 500 mg by mouth daily with evening meal. , Disp: , Rfl:     multivitamin/iron/folic acid (CENTRUM ORAL), , Disp: , Rfl:     omeprazole (PRILOSEC) 20 MG capsule, TAKE ONE CAPSULE BY MOUTH EVERY MORNING BEFORE BREAKFAST, Disp: 90 capsule, Rfl: 1    ondansetron (ZOFRAN-ODT) 4 MG disintegrating tablet, Take 1 tablet (4 mg total) by mouth every eight (8) hours as needed for nausea., Disp: 30 tablet, Rfl: 3    ONE-A-DAY MEN'S COMPLETE 240 mcg-30 mcg- 300 mcg Tab, take 1 Tablet by mouth at bedtime, Disp: 100 tablet, Rfl: 0    rifAXIMin (XIFAXAN) 550 mg Tab, Take 1 tablet (550 mg total) by mouth Two (2) times a day., Disp: 180 tablet, Rfl: 3    spironolactone (ALDACTONE) 100 MG tablet, take 1 Tablet by mouth in the morning (take with 50mg  tablet TO equal 150mg  total), Disp: 30 tablet, Rfl: 3    zinc gluconate 50 mg (7 mg elemental zinc) tablet, Take 50 mg by mouth daily. Taking twice a day, Disp: , Rfl:     Facility-Administered Medications Ordered in Other Encounters:     furosemide (LASIX) injection 40 mg, 40 mg, Intravenous, Once, [employer], ANP    ALLERGIES:   Other    SOCIAL HISTORY:   Social History     Tobacco Use    Smoking status: Never    Smokeless tobacco: Never   Substance Use Topics    Alcohol use: Not Currently       FAMILY HISTORY:  No family history on file.       Physical Exam  VITAL SIGNS:    BP 141/75  - Pulse 68  - Temp 36.7 ??C (98.1 ??F) (Oral)  - Resp 18  - Wt 87.2 kg (192 lb 3.9 oz)  - SpO2 100%  - BMI 30.10 kg/m??     Constitutional: Patient is alert and oriented. Patient is laying comfortably in bed and is ill appearing, nontoxic, and in no acute distress.  Eyes: Conjunctivae are normal.  PERRL, EOMI, scleral icterus present.  ENT       Head: Normocephalic and atraumatic.       Nose: No congestion.       Mouth/Throat: Mucous membranes are moist.       Neck: Normal ROM.   Cardiovascular: Normal rate, regular rhythm. Normal and symmetric distal pulses are present in all extremities.  Respiratory: Normal respiratory effort. Breath sounds are normal.  Gastrointestinal: Soft and nontender.  No distention.  Genitourinary: No suprapubic tenderness. There is no CVA tenderness.  Musculoskeletal: Normal range of motion in all extremities.       Right lower leg: No tenderness or edema.       Left lower leg: No tenderness or edema.  Neurologic: Normal speech and language. No gross focal neurologic deficits are appreciated.  Skin: Skin is warm, dry and intact.  Jaundiced  Psychiatric: Mood and affect are normal. Speech and behavior are normal.        Radiology       XR Chest Portable   Final Result      No acute cardiopulmonary abnormality.      US Liver Doppler   Final Result   1. Hepatic cirrhosis with sequelae of portal hypertension, including ascites and splenomegaly.   2. Patent hepatic vasculature, however flow within the main portal vein and right portal vein is hepatofugal on today's exam, previously hepatopedal on 07/04/2021 liver Doppler ultrasound. The posterior right portal vein and left portal vein are hepatopedal. Slow flow within the portal venous system.        XR Chest Portable   Final Result      No acute cardiopulmonary abnormality.      US Liver Doppler   Final Result   1. Hepatic cirrhosis with sequelae of portal hypertension, including ascites and splenomegaly.   2. Patent hepatic vasculature, however flow within the main portal vein and right portal vein is hepatofugal on today's exam, previously hepatopedal on 07/04/2021 liver Doppler ultrasound. The posterior right portal vein and left portal vein are hepatopedal. Slow flow within the portal venous system.        ECG 12 Lead    Result Date: 03/10/2022  NORMAL SINUS RHYTHM NORMAL ECG WHEN COMPARED WITH ECG OF 25-Jul-2021 16:45, QT HAS LENGTHENED Confirmed by Christella Noa ([id number]) on 03/10/2022 10:41:34 AM    XR Chest Portable    Result Date: 03/09/2022  EXAM: XR CHEST PORTABLE DATE: 03/09/2022 6:54 PM ACCESSION: [id number] UN DICTATED: 03/09/2022 7:08 PM INTERPRETATION LOCATION: [facility] CLINICAL INDICATION: 41 years old Male with DYSPNEA  TECHNIQUE: Single View AP Chest Radiograph. COMPARISON: 07/22/2021 FINDINGS: Lungs are clear. No pleural effusion or pneumothorax. Unremarkable cardiomediastinal silhouette.     No acute cardiopulmonary abnormality.    US Liver Doppler    Result Date: 03/09/2022  EXAM: US LIVER DOPPLER DATE: 03/09/2022 4:17 PM ACCESSION: [id number] UN DICTATED: 03/09/2022 4:17 PM INTERPRETATION LOCATION: [facility] CLINICAL INDICATION: 41 years old Male with Decompensated cirrhosis  COMPARISON: Abdominal MRI 02/01/2022, liver Doppler ultrasound 07/04/2021 TECHNIQUE: Ultrasound views of the complete abdomen were obtained  using grayscale, color Doppler, and spectral Doppler analysis. FINDINGS: LIVER: The liver was heterogeneous and nodular. No focal hepatic lesions. No intrahepatic biliary ductal dilatation. The common bile duct was not visualized due to overlying bowel gas.      Liver: 16.1 cm      Common bile duct: cm GALLBLADDER: The gallbladder is surgically absent. PANCREAS: Not visualized due to overlying bowel gas. SPLEEN: Marked splenomegaly.      Spleen: 21.3 cm KIDNEYS: Normal in size and echotexture. No solid masses or calculi. No hydronephrosis.      Right kidney: 10.7 cm      Left kidney: 10.7 cm VESSELS - Portal vein: Main portal vein and right portal vein are patent with hepatofugal flow. The left portal vein demonstrates hepatopedal flow. Slow portal venous velocities measuring 0.05 m/s. Main portal vein diameter: 0.98 cm      Main portal vein velocity: 0.05 m/s      Anterior right portal vein velocity: 0.09 m/s      Posterior right portal vein velocity: 0.12 m/s      Left portal vein velocity: 0.05-0.09 m/s      Main portal vein flow: hepatofugal      Right portal vein flow: hepatofugal      Left portal vein flow: hepatopetal - Splenic vein: The midline splenic vein not visualized due to overlying bowel gas. The proximal splenic vein is patent with hepatopedal flow.      Splenic vein midline: Non Vis      Splenic vein proximal: hepatopetal - Hepatic veins/IVC: The IVC, left, middle and right hepatic veins were patent with bi/triphasic waveforms.      Left hepatic vein flow: triphasic      Middle hepatic vein flow: bi-tri      Right hepatic vein flow: bi-tri      Inferior vena cava flow: bi-tri - Hepatic artery: Patent with color and spectral Doppler imaging      Common hepatic artery: Patent - Visualized proximal aorta: Not well visualized due to overlying bowel gas. OTHER: Moderate volume ascites in all 4 quadrants.     1. Hepatic cirrhosis with sequelae of portal hypertension, including ascites and splenomegaly. 2. Patent hepatic vasculature, however flow within the main portal vein and right portal vein is hepatofugal on today's exam, previously hepatopedal on 07/04/2021 liver Doppler ultrasound. The posterior right portal vein and left portal vein are hepatopedal. Slow flow within the portal venous system.     Procedures     Procedure(s) performed: None.           Hollice Gong, MD  Resident  03/14/22 [phone]

## 2022-03-09 NOTE — Unmapped (Signed)
Insurance has been verified.   ??  ??Patient has active coverage with??[employer] [place], including prescription drug coverage via??[employer] [phone]. Patient also has [employer] per Britta Mccreedy in contracts this is not insurance??and will only use patients primacy coverage of [employer]??  ??  Authorization is??approved??for Liver transplant listing valid until 08/06/22.   ??  ??Patient is financially cleared for Liver transplant lisitng

## 2022-03-09 NOTE — Unmapped (Signed)
Liver transplant patient sent to ED for W/U for encephalitis

## 2022-03-10 LAB — CBC
HEMATOCRIT: 39.5 % (ref 39.0–48.0)
HEMOGLOBIN: 14.3 g/dL (ref 12.9–16.5)
MEAN CORPUSCULAR HEMOGLOBIN CONC: 36.2 g/dL — ABNORMAL HIGH (ref 32.0–36.0)
MEAN CORPUSCULAR HEMOGLOBIN: 35 pg — ABNORMAL HIGH (ref 25.9–32.4)
MEAN CORPUSCULAR VOLUME: 96.9 fL — ABNORMAL HIGH (ref 77.6–95.7)
MEAN PLATELET VOLUME: 10.6 fL (ref 6.8–10.7)
PLATELET COUNT: 146 10*9/L — ABNORMAL LOW (ref 150–450)
RED BLOOD CELL COUNT: 4.08 10*12/L — ABNORMAL LOW (ref 4.26–5.60)
RED CELL DISTRIBUTION WIDTH: 19.8 % — ABNORMAL HIGH (ref 12.2–15.2)
WBC ADJUSTED: 10 10*9/L (ref 3.6–11.2)

## 2022-03-10 LAB — HEPATIC FUNCTION PANEL
ALBUMIN: 3.1 g/dL — ABNORMAL LOW (ref 3.4–5.0)
ALKALINE PHOSPHATASE: 189 U/L — ABNORMAL HIGH (ref 46–116)
ALT (SGPT): 119 U/L — ABNORMAL HIGH (ref 10–49)
AST (SGOT): 253 U/L — ABNORMAL HIGH (ref <=34)
BILIRUBIN DIRECT: >22.5 mg/dL — ABNORMAL HIGH (ref 0.00–0.30)
BILIRUBIN TOTAL: 33.6 mg/dL — ABNORMAL HIGH (ref 0.3–1.2)
PROTEIN TOTAL: 6.8 g/dL (ref 5.7–8.2)

## 2022-03-10 LAB — BASIC METABOLIC PANEL
CALCIUM: 9.5 mg/dL (ref 8.7–10.4)
CHLORIDE: 103 mmol/L (ref 98–107)
CREATININE: 1 mg/dL
EGFR CKD-EPI (2021) MALE: >90 mL/min/{1.73_m2} (ref >=60)
POTASSIUM: 4.8 mmol/L (ref 3.4–4.8)
SODIUM: 130 mmol/L — ABNORMAL LOW (ref 135–145)

## 2022-03-10 LAB — PROTIME-INR
INR: 2.11
PROTIME: 24.6 s — ABNORMAL HIGH (ref 9.8–12.8)

## 2022-03-10 MED ADMIN — spironolactone (ALDACTONE) tablet 150 mg: 150 mg | ORAL | @ 13:00:00

## 2022-03-10 MED ADMIN — pantoprazole (PROTONIX) EC tablet 20 mg: 20 mg | ORAL | @ 12:00:00

## 2022-03-10 MED ADMIN — furosemide (LASIX) tablet 60 mg: 60 mg | ORAL | @ 12:00:00

## 2022-03-10 MED ADMIN — rifAXIMin (XIFAXAN) tablet 550 mg: 550 mg | ORAL | @ 01:00:00 | Stop: 2022-04-08

## 2022-03-10 MED ADMIN — baclofen (LIORESAL) tablet 30 mg: 30 mg | ORAL | @ 01:00:00

## 2022-03-10 MED ADMIN — lactulose oral solution: 30 g | ORAL | @ 21:00:00

## 2022-03-10 MED ADMIN — lactulose oral solution: 30 g | ORAL | @ 12:00:00

## 2022-03-10 MED ADMIN — rifAXIMin (XIFAXAN) tablet 550 mg: 550 mg | ORAL | @ 12:00:00 | Stop: 2022-04-08

## 2022-03-10 MED ADMIN — lactulose oral solution: 30 g | ORAL | @ 01:00:00

## 2022-03-10 MED ADMIN — traZODone (DESYREL) tablet 50 mg: 50 mg | ORAL | @ 05:00:00

## 2022-03-10 NOTE — Unmapped (Shared)
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 *    Decompensated Cirrhosis 2/2 NASH  (MELD-Na 33)   c/b Hepatic Encephalopathy -   Follows with [facility] (Dr. Piedad Climes). Presented with one day of worsening hepatic encephalopathy and worsening liver function, recommended to go to ED by liver transplant team for further evaluation. On exam, was alert and oriented but slow recall, notable jaundice, abdominal distension but no ttp, no asterixis present. Labs notable for T bilirubin 34.4 (up from 10 one month prior) AST 271, AST 136, alk phos 203, Na 127, Cr 0.96, BUN pending, INR 2.16, Hgb 14.9, platelets 160, WBC 12.1. MELD-Na 33 on admission, up from MELD-Na ~20 last month. He has been adherent to all medications except for missed doses of lactulose and rifaximin. Last EGD 09/2020 showed Grade 1 varices in lower 1/3 esophagus and mild portal hypertensive gastropathy. Unclear etiology or trigger of his worsening liver function, HE likely worsened iso missed lactulose and rifaximin.Concerned for worsening of his portal hypertension. Lower suspicion for infection including SBP, remains afebrile, no abdominal pain or tenderness however no tappable pocket on bedside ultrasound for paracentesis to confirm. Will discuss with hepatology and continue imaging and broad infectious workup.  - F/u Hepatology consult, appreciate recommendations  - F/u Nutrition consult  - Strict I's/O's, daily weight  - Continue home Lasix 60mg  PO   - Continue home rifaximin 550mg    - Continue home lactulose 30mg  TID  - Continue home spironolactone 150mg  daily  - Additional lactulose 30mg  PRN for goal 3-4 loose BM daily   - Continue protonix 20mg  daily   - CBC, BMP, hepatic function panel, PT-INR, Mg daily   - F/u Blood cultures x2, urine culture, hepatitis panel   - Diagnostic paracentesis;    - Bedside Ultrasound showed minimal free fluid; diagnostic paracentesis not indicated at this time.   - Follow-up liver doppler   - Shows moderate fluid pouches in all four quadrants  - Follow-up CXR   - Unremarkable     Hyponatremia   Na 127 on admission, from 129 two days prior; hyponatremia likely 2/2 to his decompensated cirrhosis.   - BMP q12  - Diuresis as above  - Encourage PO intake    Nausea  -Zofran 4mg  PO vs IV q8 PRN   -Follow-up ECG for qtc monitoring -> Normal sinus rhythm with minimal QT prolongation    Chronic Problems  T2DM   - Hold home metformin 500mg  daily   - Follow-up HA1c -> 4.3     The patient's presentation is complicated by the following clinically significant conditions requiring additional evaluation and treatment or having a significant effect of this patient's care: - Malnutrition POA requiring further investigation, treatment, or monitoring  - Hypercoagulable state requiring additional attention to DVT prophylaxis and treatment  - Metabolic Encephalopathy POA requiring further investigation  or monitoring  - Hyponatremia POA requiring further investigation, treatment, or monitoring     Checklist:  Diet: Regular Diet  DVT PPx: SCDs  Code Status: Full Code  Dispo: Patient appropriate for floor    Chief Concern:   Decompensated hepatic cirrhosis (CMS-HCC)    Subjective:   41yo male with history of 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 History obtained by patient and patient's wife.     HPI:  Patient had one day of worsening confusion, altered mental status, fatigue, low appetite, muscle cramps, and increasing jaundice. He was instructed to take lactulose 30mg  q1hr x3, mental status and confusion improved after numerous bowel movements that day. Per patient and wife at bedside, he has had worsening memory, brain fog, fatigue over the past year. The past few weeks he has been incresaingly confused and forgetful, has missed several doses of lactulose and rifaxamin due to forgetting. Otherwise compliant with his medications. Sleeping more during the day but has some insomnia and confusion at night. Poor PO intake, only eating 1/2- 1/3 of his normal meals and not drinking much water. Has had NB emesis x 3 days, usually after eating.  Paracentesis two weeks ago (4/18), no evidence of infection. He went for paracentesis two days ago but no tappable pocket, held his lasix, gave albumin infusion and 1/2 NS . Denies any fever, chills, SOB, CP, abdominal pain, constipation, hematemesis, BRBPR, melena, hematuria. No alcohol, tobacco, drug use.     First diagnosed with NASH cirrhosis in 2018 after lap cholecystectomy. His liver disease continued to progress, initially medically managed with Lasix and spirinolactone. Found to have grade 1 non-bleeding esophageal varices and ascites. Has had intermittent hepatic encephalopathy (confusion, irritability, insomnia) for approximately one year, started on lactulose and rifaximin. MELD-Na had been ~19 for sometime.     In the ED:  Vitals: Temp: afebrile, HR 82, BP 152/94, RR 16, SpO2 of 100 on RA  Labs: CBC notable for leukocytosis to 12.2, CMP notable for T bili of 34.4, AST: 271, ALT: 136, Alk phos: 203, INR: 2.16   Cultures: U/A not consistent with infection  EKG: pending  Imaging: F/u Liver doppler read  Interventions: None    MELD-Na: 33 at 03/09/2022  2:53 PM  MELD: 29 at 03/09/2022  2:53 PM  Calculated from:  Serum Creatinine: 0.96 mg/dL (Using min of 1 mg/dL) at 03/12/8468  6:29 PM  Serum Sodium: 127 mmol/L at 03/09/2022  2:53 PM  Total Bilirubin: 34.4 mg/dL at 03/07/8412  2:44 PM  INR(ratio): 2.16 at 03/09/2022  2:53 PM        Designated Healthcare Decision Maker:  Derek Boyer currently has decisional capacity for healthcare decision-making and is able to designate a surrogate healthcare decision maker. Mr. Harriett Rush designated healthcare decision maker(s) is/are Derek Boyer(the patient's spouse) as denoted by stated patient preference.    Objective:   Physical Exam:  Temp:  [36.7 ??C (98.1 ??F)-37.1 ??C (98.7 ??F)] 36.7 ??C (98.1 ??F)  Heart Rate:  [64-82] 72  SpO2 Pulse:  [61-72] 72  Resp:  [15-18] 18  BP: (123-152)/(67-94) 123/69  SpO2:  [96 %-100 %] 97 %    Gen: Fatigued, ill appearing and oriented but slower mentation.   Eyes: Sclera icteric, EOMI grossly normal   HENT: atraumatic, normocephalic  Neck: trachea midline  Heart: RRR  Lungs: CTAB, no crackles or wheezes  Abdomen: Distended but soft. No TTP, no rebound/guarding.  Extremities: edema  Neuro: No focal neuro deficits. CNII-XII grossly intact. No asterixis.   Skin:  No rashes, lesions on clothed exam. Jaundice.

## 2022-03-10 NOTE — Unmapped (Cosign Needed)
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 *    Decompensated Cirrhosis 2/2 NASH  (MELD-Na 33)   c/b Hepatic Encephalopathy -   Follows with [facility] (Dr. Piedad Climes). Presented with one day of worsening hepatic encephalopathy and worsening liver function, recommended to go to ED by liver transplant team for further evaluation. On exam, was alert and oriented but slow recall, notable jaundice, abdominal distension but no ttp, no asterixis present. Labs notable for T bilirubin 34.4 (up from 10 one month prior) AST 271, AST 136, alk phos 203, Na 127, Cr 0.96, BUN pending, INR 2.16, Hgb 14.9, platelets 160, WBC 12.1. MELD-Na 33 on admission, up from MELD-Na ~20 last month. He has been adherent to all medications except for missed doses of lactulose and rifaximin. Last EGD 09/2020 showed Grade 1 varices in lower 1/3 esophagus and mild portal hypertensive gastropathy. Unclear etiology or trigger of his worsening liver function, HE likely worsened iso missed lactulose and rifaximin.Concerned for worsening of his portal hypertension. Lower suspicion for infection including SBP, remains afebrile, no abdominal pain or tenderness however no tappable pocket on bedside ultrasound for paracentesis to confirm. Will discuss with hepatology and continue imaging and broad infectious workup.  - Hepatology consulted, appreciate recommendations  - Continue home Lasix 60mg  PO   - Continue home rifaximin 550mg    - Continue home lactulose 30mg  TID  - Continue home spironolactone 150mg  daily  - Additional lactulose 30mg  PRN for goal 3-4 loose BM daily   - Continue protonix 20mg  daily   - CBC, BMP, hepatic function panel, PT-INR, Mg daily   - Blood cultures x2, urine culture, hepatitis panel   - Plan for diagnostic paracentesis as able  - Follow-up liver doppler  - Follow-up CXR  - Nutrition consult  - Strict I's/O's, daily weight    Hyponatremia   Na 127 on admission, from 129 two days prior; hyponatremia likely 2/2 to his decompensated cirrhosis.   - BMP q12  - Diuresis as above  - Encourage PO intake    Nausea  -Zofran 4mg  PO vs IV q8 PRN   -Follow-up ECG for qtc monitoring    Chronic Problems  T2DM   - Hold home metformin 500mg  daily   - Follow-up HA1c    The patient's presentation is complicated by the following clinically significant conditions requiring additional evaluation and treatment or having a significant effect of this patient's care: - Malnutrition POA requiring further investigation, treatment, or monitoring  - Hypercoagulable state requiring additional attention to DVT prophylaxis and treatment  - Metabolic Encephalopathy POA requiring further investigation or monitoring  - Hyponatremia POA requiring further investigation, treatment, or monitoring     Checklist:  Diet: Regular Diet  DVT PPx: SCDs  Code Status: Full Code  Dispo: Patient appropriate for floor    Chief Concern:   Decompensated  hepatic cirrhosis (CMS-HCC)    Subjective:   41yo male with history of 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 History obtained by patient and patient's wife.     HPI:  Patient had one day of worsening confusion, altered mental status, fatigue, low appetite, muscle cramps, and increasing jaundice. He was instructed to take lactulose 30mg  q1hr x3, mental status and confusion improved after numerous bowel movements that day. Per patient and wife at bedside, he has had worsening memory, brain fog, fatigue over the past year. The past few weeks he has been incresaingly confused and forgetful, has missed several doses of lactulose and rifaxamin due to forgetting. Otherwise compliant with his medications. Sleeping more during the day but has some insomnia and confusion at night. Poor PO intake, only eating 1/2- 1/3 of his normal meals and not drinking much water. Has had NB emesis x 3 days, usually after eating.  Paracentesis two weeks ago (4/18), no evidence of infection. He went for paracentesis two days ago but no tappable pocket, held his lasix, gave albumin infusion and 1/2 NS . Denies any fever, chills, SOB, CP, abdominal pain, constipation, hematemesis, BRBPR, melena, hematuria. No alcohol, tobacco, drug use.     First diagnosed with NASH cirrhosis in 2018 after lap cholecystectomy. His liver disease continued to progress, initially medically managed with Lasix and spirinolactone. Found to have grade 1 non-bleeding esophageal varices and ascites. Has had intermittent hepatic encephalopathy (confusion, irritability, insomnia) for approximately one year, started on lactulose and rifaximin. MELD-Na had been ~19 for sometime.     In the ED:  Vitals: Temp: afebrile, HR 82, BP 152/94, RR 16, SpO2 of 100 on RA  Labs: CBC notable for leukocytosis to 12.2, CMP notable for T bili of 34.4, AST: 271, ALT: 136, Alk phos: 203, INR: 2.16   Cultures: U/A not consistent with infection  EKG: pending  Imaging: Liver doppler pending  Interventions: None    MELD-Na: 33 at 03/09/2022  2:53 PM  MELD: 29 at 03/09/2022  2:53 PM  Calculated from:  Serum Creatinine: 0.96 mg/dL (Using min of 1 mg/dL) at 11/11/1094  0:45 PM  Serum Sodium: 127 mmol/L at 03/09/2022  2:53 PM  Total Bilirubin: 34.4 mg/dL at 4/0/9811  9:14 PM  INR(ratio): 2.16 at 03/09/2022  2:53 PM        Designated Healthcare Decision Maker:  Mr. [name] currently has decisional capacity for healthcare decision-making and is able to designate a surrogate healthcare decision maker. Mr. Harriett Rush designated healthcare decision maker(s) is/are [name] Mcdougald(the patient's spouse) as denoted by stated patient preference.    Objective:   Physical Exam:  Temp:  [36.8 ??C (98.2 ??F)-37.1 ??C (98.7 ??F)] 37.1 ??C (98.7 ??F)  Heart Rate:  [67-82] 67  Resp:  [15-16] 15  BP: (127-152)/(67-94) 127/67  SpO2:  [97 %-100 %] 97 %    Gen: NAD, alert and oriented but slower mentation.   Eyes: Sclera icteric, EOMI grossly normal   HENT: atraumatic, normocephalic  Neck: trachea midline  Heart: RRR  Lungs: CTAB, no crackles or wheezes  Abdomen: Distended but soft. No TTP, no rebound/guarding.  Extremities: edema  Neuro: No focal neuro deficits. CNII-XII grossly intact. No asterixis.   Skin:  No rashes, lesions on clothed exam. Jaundice.

## 2022-03-10 NOTE — Unmapped (Addendum)
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     MELD-Na: 31 at 03/10/2022  5:13 AM  MELD: 28 at 03/10/2022  5:13 AM  Calculated from:  Serum Creatinine: 1.00 mg/dL at 07/11/6212  0:86 AM  Serum Sodium: 130 mmol/L at 03/10/2022  5:13 AM  Total Bilirubin: 33.6 mg/dL at 03/12/8468  6:29 AM  INR(ratio): 2.11 at 03/10/2022  5:13 AM      Patient had one day of worsening confusion, altered mental status, fatigue, low appetite, muscle cramps, and increasing jaundice. He was instructed to take lactulose 30mg  q1hr x3, mental status and confusion improved after numerous bowel movements that day. Per patient and wife at bedside, he has had worsening memory, brain fog, fatigue over the past year. The past few weeks he has been incresaingly confused and forgetful, has missed several doses of lactulose and rifaxamin due to forgetting. Otherwise compliant with his medications. Sleeping more during the day but has some insomnia and confusion at night. Poor PO intake, only eating 1/2- 1/3 of his normal meals and not drinking much water.     He had paracentesis two weeks ago (4/18) ***. He went for paracentesis ** but no tappable pocket, they held his lasix and gave him some fluids.       Decompensated Cirrhosis  (MELD-Na 33)  Lower suspicion for SBP, remains afebrile, no abdominal pain or tenderness but no tappable pocket on bedside ultrasound for paracentesis.   - Defer starting ceftriaxone ***  - Liver doppler  - CBC w diff, CMP, PT-INR, Mg  - Blood cultures x2, urine culture, hepatitis panel   - CXR       In the ED:  Vitals: Temp: afebrile, HR 82, BP 152/94, RR 16, SpO2 of 100 on RA  Labs: CBC notable for leukocytosis to 12.2, CMP notable for T bili of 34.4, AST: 271, ALT: 136, Alk phos: 203, INR: 2.16   Cultures: U/A not consistent with infection  EKG: ***  Imaging: Liver doppler showed ***  Interventions: None    MELD-Na: 33 at 03/09/2022  2:53 PM  MELD: 29 at 03/09/2022  2:53 PM  Calculated from:  Serum Creatinine: 0.96 mg/dL (Using min of 1 mg/dL) at 03/07/8412  2:44 PM  Serum Sodium: 127 mmol/L at 03/09/2022  2:53 PM  Total Bilirubin: 34.4 mg/dL at 0/11/270  5:36 PM  INR(ratio): 2.16 at 03/09/2022  2:53 PM

## 2022-03-10 NOTE — Unmapped (Cosign Needed)
Internal Medicine (MEDL) Progress Note    Assessment & Plan:   Derek Boyer is a 41 y.o. 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 *    Decompensated Cirrhosis 2/2 NASH  (MELD-Na 33)   c/b Hepatic Encephalopathy   Follows with [facility] (Dr. Piedad Climes). Presented with one day of worsening hepatic encephalopathy and worsening liver function, recommended to go to ED by liver transplant team for further evaluation. On exam, was alert and oriented but slow recall, notable jaundice, abdominal distension but no ttp, no asterixis present. Labs notable for T bilirubin 34.4 (up from 10 one month prior) AST 271, AST 136, alk phos 203, Na 127, Cr 0.96, BUN pending, INR 2.16, Hgb 14.9, platelets 160, WBC 12.1. MELD-Na 33 on admission, up from MELD-Na ~20 last month. He has been adherent to all medications except for missed doses of lactulose and rifaximin. Last EGD 09/2020 showed Grade 1 varices in lower 1/3 esophagus and mild portal hypertensive gastropathy. Unclear etiology or trigger of his worsening liver function, HE likely worsened iso missed lactulose and rifaximin.Concerned for worsening of his portal hypertension. Lower suspicion for infection including SBP, remains afebrile, no abdominal pain or tenderness however no tappable pocket on bedside ultrasound for paracentesis to confirm. Plan to discuss with hepatology and transplant team, possibly discharge tomorrow pending recommendations and infectious workup.   - Hepatology following, appreciate recommendations  - Appreciate transplant team's recommendations  - Continue home Lasix 60mg  PO   - Continue home rifaximin 550mg    - Continue home lactulose 30mg  TID  - Continue home spironolactone 150mg  daily  - Additional lactulose 30mg  PRN for goal 4-5 loose BM daily   - Continue protonix 20mg  daily   - CBC, BMP, hepatic function panel, PT-INR, Mg daily   - Follow-up blood cultures x2, urine culture - NGTD  - Nutrition consulted  - Strict I's/O's, daily weight  ??  Hyponatremia (Improved)  Na 127 on admission, from 129 two days prior; hyponatremia likely 2/2 to his decompensated cirrhosis. Now stable at 130.   - BMP q12  - Diuresis as above  - Encourage PO intake  ??  Nausea  -Zofran 4mg  PO vs IV q8 PRN   ??  Chronic Problems  T2DM (A1c 4.3%)  - Hold home metformin 500mg  daily   - Follow-up HA1c  ??Insomnia  - Trazodone 50mg  nightly PRN    Daily Checklist:  Diet: Sodium Restricted (2g) and Regular Diet  DVT PPx: Lovenox 40mg  q24h  Electrolytes: Replete Potassium to >/=4 and Magnesium to >/=2  Code Status: Full Code  Dispo: Transfer to Floor    Team Contact Information:   Primary Team: Internal Medicine (MEDL)  Primary Resident: Elease Etienne, MD  Resident's Pager: [phone] (Gen MedL Intern - [place] Asters)    Interval History:   - NAEO  - Patient doing well, alert and oriented x3, feels less fatigue than yesterday. Good appetite and tolerating PO.  No fluid pocket on bedside ultrasound.   ROS: Denies headache, chest pain, shortness of breath, abdominal pain, nausea, vomiting.    Objective:   Temp:  [36.6 ??C (97.9 ??F)-36.7 ??C (98.1 ??F)] 36.6 ??C (97.9 ??F)  Heart Rate:  [64-74] 74  SpO2 Pulse:  [61-72] 72  Resp:  [16-18] 16  BP: (123-148)/(68-85) 135/68  SpO2:  [95 %-97 %] 95 %    Gen: NAD, alert and oriented but slower mentation.   Eyes: Sclera icteric, EOMI grossly normal   HENT: atraumatic, normocephalic  Neck: trachea midline  Heart: RRR  Lungs: CTAB, no crackles or wheezes  Abdomen: Distended but soft. No TTP, no rebound/guarding.  Extremities: edema  Neuro: No focal neuro deficits. CNII-XII grossly intact. No asterixis.   Skin:  No rashes, lesions on clothed exam. Jaundice    Labs/Studies: Labs and Studies from the last 24hrs per EMR and Reviewed

## 2022-03-10 NOTE — Unmapped (Signed)
Report given to Payton RN

## 2022-03-10 NOTE — Unmapped (Cosign Needed)
Hepatology Consult Service   Initial Consultation         Assessment and Recommendations:   Derek Boyer is a 41 y.o. 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    Cirrhosis, decompensated, d/t NASH - Esophageal varices, grade 1, non-bleeding - Hepatic encephalopathy  MELD-Na is 31 today from ~19, d/t markedly increased bilirubin and somewhat increased INR; is listed for transplant, followed by [facility].  Onset of worse nausea/anorexia two weeks ago.  In late February - early March, patient was prescribed Azithromycin x5d and Cefdinir x10d. Azithromycin has a rare but well-known association with drug-induced liver injury (DILI), which can normally show latency of several weeks. 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  In setting of nausea, looser stools and possible indolent onset of confusion/forgetfulness, patient started taking less Lactulose than normal ~2 weeks ago -- from 4-5 BM's daily to 2-3 BM's daily. Over the past two weeks, has had worsening jaundice, altered sleep patterns, cramps and twitching feet. Had most severe of hepatic encephalopathy Tuesday night, which improved significantly after high-dose Lactulose Wednesday morning. Mental status is currently near baseline.  Grossly stable AST, ALT, Alk Phos since early 2022. Abrupt increase in Bilirubin to 34 from baseline ~8, progressing since mid-April. Stable albumin but with somewhat increased PT/INR over the past few days (to ~25, from baseline ~18); stable Plt.  Infection is an unlikely precipitant; no fevers, chills, abdominal pain, dysuria, cough/SOB. Noted baseline WBC ~6-9 and currently 10, with peak 12 yesterday, and somewhat neutrophil-predominant (83%). Agree with collected broad infectious workup. CXR unremarkable, UA with bilirubinuria and otherwise bland; pending Bcx x2 and Ucx. POCUS without tappable pocket overnight; if pocket does present on repeat U/S, recommend diagnostic paracentesis given hospitalization with ascites to r/o SBP. No need to initiate SBP ppx at this time.  No need for albumin challenge or urine lytes collection given no AKI -- baseline Cr ~0.8, most recent Cr 1.0 (<0.3 increase). Hyponatremia is mild and grossly stable.  No signs/symptoms of bleeding at this time; stable Hgb, no bloody stools/emesis. Last EGD with G1 varices; not indicated to begin BB at this time.  Known portal hypertension, without systemic hypotension. U/S with patent hepatic vasculature, though with slow flow and with newly hepatofugal flow in main and R portal veins (previously hepatopedal 06/2021).  - Titrate Lactulose to goal 4-5 BM's daily (baseline); continue home Rifaximin  - Maintain K >4 to improve ammonia clearance  - Collect diagnostic paracentesis if tappable pocket presents  - F/u Bcx, Ucx  - Trend CBC, BMP + LFTs, PT/INR  - Continue home diuresis; if AKI presents, would hold  - No need for albumin challenge or SBP ppx at this time  - Diet <2g Na, avoid NSAIDs      Thank you for involving [place] in the care of your patient. We will continue to follow along with you.    For questions, contact the on-call fellow for the Hepatology Consult Service at [phone].     Reason for Consultation:   The patient is seen in consultation at the request of Benard Halsted* ([facility] for decompensated cirrhosis.    Subjective:   HPI:  History obtained from patient, who is alert and oriented, and patient's spouse.  In late February - early March, patient was prescribed  Azithromycin x5d and Cefdinir x10d.   Two weeks of worsening jaundice and nausea/anorexia, with post-prandial emesis over the past few days. Baseline 4-5 daily BMs with lactulose; over past 2 weeks, has taken less Lactulose d/t nausea/anorexia and looser stools, with only 2-3 BM's daily. For past 2 weeks, has had flipped sleep-wake schedule, awake during night and sleeping during day, and with twitching feet and cramps. This past Tuesday night into Wednesday morning, had first flare of hepatic encephalopathy, with confusion, lethargy, behavioral changes. Responded well to high-dose Lactulose on Wednesday morning (60 mL hourly for 3 hours), with improvement by mid-day, and without worsening since then.  No fevers/chills, bloody stools or hemoptysis, increase in abdominal pain or distension, cough or dysuria, increase in peripheral edema.  Reports adherence to all other medications.  Followed by [facility] with Dr Piedad Climes, is listed for transplant.    Objective:   Physical Exam:  Temp:  [36.6 ??C (97.9 ??F)-37.1 ??C (98.7 ??F)] 36.6 ??C (97.9 ??F)  Heart Rate:  [64-74] 74  SpO2 Pulse:  [61-72] 72  Resp:  [15-18] 16  BP: (123-148)/(67-85) 135/68  SpO2:  [95 %-97 %] 95 %    Gen: Chronically ill-appearing male in NAD, answers questions appropriately  Eyes: Sclera icteric  Abdomen: Normoactive bowel sounds, soft and nontender, no rebound/guarding, +ascites/distension, +hepatomegaly  Extremities: No clubbing, cyanosis, or edema in the BLEs  Neuro: Normal speech. No asterixis.   Skin: Jaundiced. No rashes, lesions on clothed exam  Psych: Alert, normal mood and affect.     Pertinent Labs/Studies Reviewed:  Lab Results   Component Value Date    WBC 10.0 03/10/2022    HGB 14.3 03/10/2022    HCT 39.5 03/10/2022    PLT 146 (L) 03/10/2022       Lab Results   Component Value Date    NA 130 (L) 03/10/2022    K 4.8 03/10/2022    CL 103 03/10/2022    CO2  03/10/2022      Comment:      Specimen icteric.        BUN  03/10/2022      Comment:      Specimen icteric.      CREATININE 1.00 03/10/2022    GLU  03/10/2022      Comment:      Specimen icteric.      CALCIUM 9.5 03/10/2022    MG 1.6 07/25/2021    PHOS 2.7 07/25/2021       Lab Results Component Value Date    BILITOT 33.6 (H) 03/10/2022    BILIDIR >22.50 (H) 03/10/2022    PROT 6.8 03/10/2022    ALBUMIN 3.1 (L) 03/10/2022    ALT 119 (H) 03/10/2022    AST 253 (H) 03/10/2022    ALKPHOS 189 (H) 03/10/2022       Lab Results   Component Value Date    PT 24.6 (H) 03/10/2022    INR 2.11 03/10/2022    APTT 32.8 07/25/2021       Ronna Polio, MD  Internal Medicine & Pediatrics, PGY-1

## 2022-03-10 NOTE — Unmapped (Signed)
Updated [name] MELD score in [place] today with the following labs:  Recertification of  MELD is 31 via UNOS    [name] was also noted to have the following Mild Encephalopathy and Controlled Ascites and this was noted in his MELD upgrade today.  Lab Results   Component Value Date    CREATININE 1.00 03/10/2022    NA 130 (L) 03/10/2022    BILITOT 33.6 (H) 03/10/2022    ALBUMIN 3.1 (L) 03/10/2022    INR 2.11 03/10/2022

## 2022-03-11 LAB — BASIC METABOLIC PANEL
CALCIUM: 9.3 mg/dL (ref 8.7–10.4)
CHLORIDE: 98 mmol/L (ref 98–107)
CREATININE: 0.88 mg/dL
EGFR CKD-EPI (2021) MALE: >90 mL/min/{1.73_m2} (ref >=60)
POTASSIUM: 4.4 mmol/L (ref 3.4–4.8)
SODIUM: 129 mmol/L — ABNORMAL LOW (ref 135–145)

## 2022-03-11 LAB — HEPATIC FUNCTION PANEL
ALKALINE PHOSPHATASE: 179 U/L — ABNORMAL HIGH (ref 46–116)
BILIRUBIN DIRECT: >22.5 mg/dL — ABNORMAL HIGH (ref 0.00–0.30)
BILIRUBIN TOTAL: 33.5 mg/dL — ABNORMAL HIGH (ref 0.3–1.2)

## 2022-03-11 LAB — CBC
HEMATOCRIT: 35.3 % — ABNORMAL LOW (ref 39.0–48.0)
HEMOGLOBIN: 12.7 g/dL — ABNORMAL LOW (ref 12.9–16.5)
MEAN CORPUSCULAR HEMOGLOBIN CONC: 36 g/dL (ref 32.0–36.0)
MEAN CORPUSCULAR HEMOGLOBIN: 34.9 pg — ABNORMAL HIGH (ref 25.9–32.4)
MEAN CORPUSCULAR VOLUME: 96.8 fL — ABNORMAL HIGH (ref 77.6–95.7)
MEAN PLATELET VOLUME: 10.8 fL — ABNORMAL HIGH (ref 6.8–10.7)
PLATELET COUNT: 133 10*9/L — ABNORMAL LOW (ref 150–450)
RED BLOOD CELL COUNT: 3.65 10*12/L — ABNORMAL LOW (ref 4.26–5.60)
RED CELL DISTRIBUTION WIDTH: 19.1 % — ABNORMAL HIGH (ref 12.2–15.2)
WBC ADJUSTED: 8.2 10*9/L (ref 3.6–11.2)

## 2022-03-11 LAB — PROTIME-INR
INR: 2.42
PROTIME: 28.4 s — ABNORMAL HIGH (ref 9.8–12.8)

## 2022-03-11 MED ORDER — LACTULOSE 10 GRAM/15 ML ORAL SOLUTION
Freq: Three times a day (TID) | ORAL | 11 refills | 7 days | Status: CP
Start: 2022-03-11 — End: 2022-06-09

## 2022-03-11 MED ADMIN — baclofen (LIORESAL) tablet 30 mg: 30 mg | ORAL | @ 02:00:00

## 2022-03-11 MED ADMIN — lactulose oral solution: 30 g | ORAL | @ 16:00:00 | Stop: 2022-03-11

## 2022-03-11 MED ADMIN — lactulose oral solution: 30 g | ORAL | @ 02:00:00

## 2022-03-11 MED ADMIN — lactulose oral solution: 30 g | ORAL | @ 12:00:00 | Stop: 2022-03-11

## 2022-03-11 MED ADMIN — rifAXIMin (XIFAXAN) tablet 550 mg: 550 mg | ORAL | @ 02:00:00 | Stop: 2022-04-08

## 2022-03-11 MED ADMIN — pantoprazole (PROTONIX) EC tablet 20 mg: 20 mg | ORAL | @ 13:00:00 | Stop: 2022-03-11

## 2022-03-11 MED ADMIN — traZODone (DESYREL) tablet 50 mg: 50 mg | ORAL | @ 02:00:00

## 2022-03-11 MED ADMIN — furosemide (LASIX) tablet 60 mg: 60 mg | ORAL | @ 12:00:00 | Stop: 2022-03-11

## 2022-03-11 MED ADMIN — spironolactone (ALDACTONE) tablet 150 mg: 150 mg | ORAL | @ 12:00:00 | Stop: 2022-03-11

## 2022-03-11 MED ADMIN — rifAXIMin (XIFAXAN) tablet 550 mg: 550 mg | ORAL | @ 13:00:00 | Stop: 2022-03-11

## 2022-03-11 NOTE — Unmapped (Signed)
Adult Nutrition Assessment Note    Visit Type: MD Consult  Reason for Visit: Assessment (Nutrition)      HPI & PMH:  decompensated cirrhosis 2/2 NASH c/b non-bleeding grade 1 esophageal varices and hepatic encephalopathy, T2DM, obesity, HTN     Anthropometric Data:  Height:   170.2 cm (5' 7.01)   Admission weight: 87.2 kg (192 lb 3.9 oz)  Last recorded weight: 87.2 kg (192 lb 3.9 oz)  IBW:  62.7 kg   BMI: Body mass index is 30.1 kg/m??.   Usual Body Weight: ~207 lbs in September 2022 during last outpatient RD visit     Weight history prior to admission:   Wt Readings from Last 10 Encounters:   03/10/22 87.2 kg (192 lb 3.9 oz)   02/27/22 88.9 kg (196 lb)   12/29/21 89.3 kg (196 lb 12.8 oz)   10/10/21 94 kg (207 lb 4.8 oz)   07/25/21 98 kg (216 lb)   06/06/21 96.2 kg (212 lb)   03/07/21 94.8 kg (209 lb)   12/09/20 94.3 kg (207 lb 12.8 oz)   09/10/20 88.9 kg (196 lb)   09/06/20 93 kg (205 lb)        Weight changes this admission:   Last 5 Recorded Weights    03/10/22 1000   Weight: 87.2 kg (192 lb 3.9 oz)        Nutrition Focused Physical Exam:  Unable to complete at this time due to patient's clinical condition       NUTRITIONALLY RELEVANT DATA     Medications:   Nutritionally pertinent medications reviewed and evaluated for potential food and/or medication interactions.     Labs:   Nutritionally pertinent labs reviewed and include Na: 130 mmol/L    Nutrition History:   Mar 10, 2022: Prior to admission:  Patient reports having a poor appetite for the past 3 weeks, he has had to force himself to eat and drink which is new to him. He has been able to keep drinking oral nutrition supplements 3x per day. He has switched from [employer] to [employer] chocolate shakes due to higher calorie content.     Allergies, Intolerances, Sensitivities, and/or Cultural/Religious Dietary Restrictions: none identified per chart review at this time     Current Nutrition:  Oral intake          Nutritional Needs:   Daily Estimated Nutrient Needs:  Energy: 1880- 2195 kcals 30-35 kcal/kg using ideal body weight, 62.7 kg (03/10/22 1708)]  Protein: 95- 125 gm [1.5-2.0 gm/kg using ideal body weight, 62.7 kg (03/10/22 1708)]  Fluid:   [per MD team]      Malnutrition assessment not yet completed at this time due to inability to complete nutrition focused physical exam (NFPE).    GOALS and EVALUATION     Patient to consume 75% or greater of po intake via combination of meals, snacks, and/or oral supplements within 72 hours.  - New/Progressing    Motivation, Barriers, and Compliance:  Evaluation of motivation, barriers, and compliance pending at this time due to clinical status.     NUTRITION ASSESSMENT     Current nutrition therapy is appropriate and progressing toward meeting meeting nutritional needs at this time.   Patient would benefit from start of oral supplement to better meet nutritional needs.      Discharge Planning:   Monitor for potential discharge needs with multi-disciplinary team.       NUTRITION INTERVENTIONS and RECOMMENDATION     Continue Regular diet  Start Ensure Plus High Protein- Chocolate 4x per day   Weight patient 2x weekly     Follow-Up Parameters:   1-2 times per week (and more frequent as indicated)    Lanelle Bal, RD, LDN, CCTD  Abdominal Transplant Dietitian   Pager: [phone]

## 2022-03-11 NOTE — Unmapped (Signed)
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 Vital signs and laboratory values were stable and within normal limits. Surgical wounds were inspected and found to be consistent with the performed procedure. Discharge plan was discussed, instructions were given, and all questions answered.    Subjective  No acute events overnight.    Objective   BP 131/66  - Pulse 68  - Temp 36.6 ??C (97.9 ??F) (Oral)  - Resp 18  - Wt 87.2 kg (192 lb 3.9 oz)  - SpO2 99%  - BMI 30.10 kg/m??     No intake or output data in the 24 hours ending 03/11/22 1205    Hospital Course:  41yo male with history of 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     MELD-Na: 31 at 03/10/2022  5:13 AM  MELD: 28 at 03/10/2022  5:13 AM  Calculated from:  Serum Creatinine: 1.00 mg/dL at 04/09/4033  7:42 AM  Serum Sodium: 130 mmol/L at 03/10/2022  5:13 AM  Total Bilirubin: 33.6 mg/dL at 03/14/5637  7:56 AM  INR(ratio): 2.11 at 03/10/2022  5:13 AM    Decompensated Cirrhosis 2/2 NASH   C/b Hepatic Encephalopathy (resolved)  Follows with [facility] Hepatology (Dr.Darling). Presented to [facility] with one day of worsening hepatic encephalopathy and worsening liver function, recommended to go to ED by liver transplant team for further evaluation. On exam, was alert and oriented but slow recall, notable jaundice, abdominal distension but no ttp, no asterixis present. This remained the same throughout the hospital course. Labs notable for sustained T bilirubin 34.4 (up from 10 one month prior) AST 271, AST 136, alk phos 203, Na 127, Cr 0.96, BUN pending, INR 2.16, Hgb 14.9, platelets 160, WBC 12.1. MELD-Na 33 on admission, up from MELD-Na ~20 last month. He has been adherent to all medications except for missed doses of lactulose and rifaximin. Last EGD 09/2020 showed Grade 1 varices in lower 1/3 esophagus and mild portal hypertensive gastropathy. Unclear etiology or trigger of his worsening liver function, HE likely worsened iso missed lactulose and rifaximin. Concerned for worsening of his portal hypertension. Lower suspicion for infection including SBP, remains afebrile, no abdominal pain or tenderness however no tappable pocket on bedside ultrasound for paracentesis to confirm. Did not initiate SBP ppx at this time per hepatology recommendations. No need for albumin challenge or urine lytes given no AKI with creatinine at baseline. Patient blood cultures and urine culture also were unremarkable. HE resolved after titrating lactulose to 4-5 BM's per day. Discussed with hepatology and transplant teams who agreed with  plan to discharge patient on home lactulose and rifaximin titrated to 4-5 BM per day. This was discussed with patient and significant other at the bedside who were in agreement with the plan to discharge home.     Hyponatremia (Improved)  Na 127 on admission, from 129 two days prior; hyponatremia likely 2/2 to his decompensated cirrhosis. Now stable at 129. Continued home lasix at discharge.        Outpatient Provider Follow Up Issues:   [ ]  Follow up with Transplant Surgery 05/16  [ ]  Follow up pending lab results (see below)    Pending Test Results:   Pending Labs       Order Current Status    Hepatitis Panel, Acute In process    Blood Culture #1 Preliminary result    Blood Culture #2 Preliminary result            Touchbase with Outpatient Provider:  Discussed with transplant team via telephone on day of discharge.     Condition at Discharge: Improved  Discharge Medications:      Your Medication List        CHANGE how you take these medications      lactulose 10 gram/15 mL solution  Take 45 mL (30 g total) by mouth Three (3) times a day. Titrate for 4-5 bowel movements per day  What changed: See the new instructions.            CONTINUE taking these medications      baclofen 10 MG tablet  Commonly known as: LIORESAL  Take 3 tablets (30 mg total) by mouth in the morning.     CENTRUM ORAL     furosemide 20 MG tablet  Commonly known as: LASIX  Take 3 tablets (60 mg total) by mouth daily.     magnesium oxide 400 mg (241.3 mg elemental) tablet  Commonly known as: MAG-OX  TAKE 1 Tablet BY MOUTH ONCE DAILY     metFORMIN 500 MG 24 hr tablet  Commonly known as: GLUCOPHAGE-XR  Take 500 mg by mouth daily with evening meal.     omeprazole 20 MG capsule  Commonly known as: PriLOSEC  TAKE ONE CAPSULE BY MOUTH EVERY MORNING BEFORE BREAKFAST     ondansetron 4 MG disintegrating tablet  Commonly known as: ZOFRAN-ODT  Take 1 tablet (4 mg total) by mouth every eight (8) hours as needed for nausea.     ONE-A-DAY MEN'S COMPLETE 240 mcg-30 mcg- 300 mcg Tab  Generic drug: multivit,calc,min-FA-K1-lycop  take 1 Tablet by mouth at bedtime     spironolactone 100 MG tablet  Commonly known as: ALDACTONE  take 1 Tablet by mouth in the morning (take with 50mg  tablet TO equal 150mg  total)     XIFAXAN 550 mg Tab  Generic drug: rifAXIMin  Take 1 tablet (550 mg total) by mouth Two (2) times a day.     zinc gluconate 50 mg (7 mg elemental zinc) tablet  Take 50 mg by mouth daily. Taking twice a day                Hospital Radiology:  ECG 12 Lead    Result Date: 03/10/2022  NORMAL SINUS RHYTHM NORMAL ECG WHEN COMPARED WITH ECG OF 25-Jul-2021 16:45, QT HAS LENGTHENED Confirmed by Christella Noa ([id number]) on 03/10/2022 10:41:34 AM    XR Chest Portable    Result Date: 03/09/2022  EXAM: XR CHEST PORTABLE DATE: 03/09/2022 6:54 PM ACCESSION: [id number] UN DICTATED: 03/09/2022 7:08 PM INTERPRETATION LOCATION: Main  Campus     CLINICAL INDICATION: 41 years old Male with DYSPNEA      TECHNIQUE: Single View AP Chest Radiograph.     COMPARISON: 07/22/2021     FINDINGS:     Lungs are clear. No pleural effusion or pneumothorax.     Unremarkable cardiomediastinal silhouette.                 No acute cardiopulmonary abnormality.    MRI abdomen with and without contrast    Result Date: 02/01/2022  EXAM: MRI ABDOMEN W WO CONTRAST DATE: 02/01/2022 9:12 AM ACCESSION: [id number] UN DICTATED: 02/01/2022 9:52 AM INTERPRETATION LOCATION: [facility]     CLINICAL INDICATION: 41 years old Male with pre - liver transplant HCC screen ; Liver disease, chronic, tumor screening ; Liver transplant  - K75.81 - Liver cirrhosis secondary to NASH (CMS - HCC) - K74.60 - Liver cirrhosis secondary to NASH (CMS - HCC) - Z12.9 - Screening for cancer - Z76.82 - Awa      COMPARISON: 07/22/2021     TECHNIQUE: MRI of the abdomen was obtained with and without IV contrast. Multisequence, multiplanar images were obtained.       CONTRAST: 10mL of  Multihance     FINDINGS:     LINES/DEVICES: None. LOWER CHEST: Unremarkable. HEPATOBILIARY: The liver is cirrhotic with a nodular contour and left hepatic hypertrophy. No enhancing or washout lesions identified. Subtle intrinsic T2 nodules along the periphery of the liver likely regenerative. No biliary ductal dilatation. Gallbladder is surgically absent. PANCREAS: Unremarkable. SPLEEN: Enlarged measuring 21 cm in craniocaudal dimension. ADRENAL GLANDS: Unremarkable. KIDNEYS/URETERS: Unremarkable. BOWEL/PERITONEUM/RETROPERITONEUM: No bowel obstruction. No acute inflammatory process. Moderate volume ascites, decreased. VASCULATURE: Abdominal aorta within normal limits for patient's age. Portal and hepatic veins are patent.  Small caliber paraesophageal and perisplenic varices are noted. Unremarkable inferior vena cava. LYMPH NODES: No adenopathy. BONES/SOFT TISSUES: Unremarkable.         Cirrhosis with sequelae of portal hypertension. No suspicious hepatic lesions.     ________________________________________________________ Linus Galas 5 = Definitely hepatocellular carcinoma (concordant with OPTN 5) LI-RADS 4 = Probably hepatocellular carcinoma LI-RADS 3 = Indeterminate LI-RADS 2 = Probably benign LI-RADS 1 = Definitely benign LI-RADS M= Probably or definitely malignant, not necessarily HCC     NOTE:  LI-RADS is only validated in patients with the following risk factors: Cirrhosis, chronic hepatitis B or current/prior hepatocellular carcinoma.  If the patient does not have these risk factors, LI-RADS classification may not be applied.     The LI-RADS / OPTN classification of liver lesions has been adopted to standardize CT and MRI scan reporting in patients at risk for hepatocellular carcinoma. The imaging criteria for definite hepatocellular carcinoma are concordant for the LI-RADS and OPTN systems. LI-RADS criteria and documentation are available online at [employer].  This report utilizes LI-RADS version 2018         US Liver Doppler    Result Date: 03/09/2022  EXAM: US LIVER DOPPLER DATE: 03/09/2022 4:17 PM ACCESSION: [id number] UN DICTATED: 03/09/2022 4:17 PM INTERPRETATION LOCATION: [facility]     CLINICAL INDICATION: 41 years old Male with Decompensated cirrhosis      COMPARISON: Abdominal MRI 02/01/2022, liver Doppler ultrasound 07/04/2021     TECHNIQUE: Ultrasound views of the complete abdomen were obtained using grayscale, color Doppler, and spectral Doppler analysis.     FINDINGS:     LIVER: The liver was heterogeneous and nodular. No focal hepatic lesions. No intrahepatic biliary ductal dilatation. The common bile duct  was not visualized due to overlying bowel gas.      Liver: 16.1 cm      Common bile duct: cm     GALLBLADDER: The gallbladder is surgically absent. PANCREAS: Not visualized due to overlying bowel gas.     SPLEEN: Marked splenomegaly.      Spleen: 21.3 cm     KIDNEYS: Normal in size and echotexture. No solid masses or calculi. No hydronephrosis.      Right kidney: 10.7 cm      Left kidney: 10.7 cm     VESSELS - Portal vein: Main portal vein and right portal vein are patent with hepatofugal flow. The left portal vein demonstrates hepatopedal flow. Slow portal venous velocities measuring 0.05 m/s.     Main portal vein diameter: 0.98 cm          Main portal vein velocity: 0.05 m/s      Anterior right portal vein velocity: 0.09 m/s      Posterior right portal vein velocity: 0.12 m/s      Left portal vein velocity: 0.05-0.09 m/s          Main portal vein flow: hepatofugal      Right portal vein flow: hepatofugal      Left portal vein flow: hepatopetal     - Splenic vein: The midline splenic vein not visualized due to overlying bowel gas. The proximal splenic vein is patent with hepatopedal flow.      Splenic vein midline: Non Vis      Splenic vein proximal: hepatopetal     - Hepatic veins/IVC: The IVC, left, middle and right hepatic veins were patent with bi/triphasic waveforms.      Left hepatic vein flow: triphasic      Middle hepatic vein flow: bi-tri      Right hepatic vein flow: bi-tri      Inferior vena cava flow: bi-tri     - Hepatic artery: Patent with color and spectral Doppler imaging      Common hepatic artery: Patent     - Visualized proximal aorta: Not well visualized due to overlying bowel gas.     OTHER: Moderate volume ascites in all 4 quadrants.         1. Hepatic cirrhosis with sequelae of portal hypertension, including ascites and splenomegaly. 2. Patent hepatic vasculature, however flow within the main portal vein and right portal vein is hepatofugal on today's exam, previously hepatopedal on 07/04/2021 liver Doppler ultrasound. The posterior right portal vein and left portal vein are hepatopedal. Slow flow within the portal venous system.       Most Recent Labs:  Lab Results   Component Value Date    WBC 8.2 03/11/2022    HGB 12.7 (L) 03/11/2022    HCT 35.3 (L) 03/11/2022    PLT 133 (L) 03/11/2022       Lab Results   Component Value Date    NA 129 (L) 03/11/2022    K 4.4 03/11/2022    CL 98 03/11/2022    CO2  03/11/2022      Comment:      Specimen icteric.       BUN  03/11/2022      Comment:      Specimen icteric.       CREATININE 0.88 03/11/2022    CALCIUM 9.3 03/11/2022    MG 1.6 07/25/2021    PHOS 2.7 07/25/2021       Lab Results   Component Value Date  ALKPHOS 179 (H) 03/11/2022    BILITOT 33.5 (H) 03/11/2022    BILIDIR >22.50 (H) 03/11/2022    PROT  03/11/2022      Comment:      Specimen icteric.       ALBUMIN  03/11/2022      Comment:      Specimen icteric.     ALT  03/11/2022      Comment:      Specimen icteric.       AST  03/11/2022      Comment:      Specimen icteric.        Lab Results   Component Value Date    PT 28.4 (H) 03/11/2022    INR 2.42 03/11/2022    APTT 32.8 07/25/2021       No results found for: AMYLASE, LIPASE      Micro:  Microbiology Results (last day)       Procedure Component Value Date/Time Date/Time    Blood Culture #1 [[id number]]  (Normal) Collected: 03/09/22 1821    Lab Status: Preliminary result Specimen: Blood from 1 Peripheral Draw Updated: 03/10/22 1830     Blood Culture, Routine No Growth at 24 hours    Blood Culture #2 [[id number]]  (Normal) Collected: 03/09/22 1821    Lab Status: Preliminary result Specimen: Blood from 1 Peripheral Draw Updated: 03/10/22 1830     Blood Culture, Routine No Growth at 24 hours    Urine Culture [[id number]]  (Normal) Collected: 03/09/22 1821    Lab Status: Final result Specimen: Urine from Clean Catch Updated: 03/10/22 1429     Urine Culture, Comprehensive NO GROWTH    Narrative:      Specimen Source: Clean Catch            Final Pathology:  No results found for: [facility]    Discharge Instructions:    Activity as tolerated.         Future Appointments:  Appointments which have been scheduled for you      Mar 21, 2022 11:00 AM  (Arrive by 10:30 AM)  ALBUMIN 90 with SURTRA INFUSION CHAIR 4  [facility] TRANSPLANT SURGERY [place] [facility] REGION) [address]  [place] [address]  [phone]        Apr 04, 2022 11:00 AM  (Arrive by 10:30 AM)  ALBUMIN 90 with [employer] INFUSION CHAIR 5  [facility] TRANSPLANT SURGERY [place] [facility] REGION) [address]  [place] [address]  [phone]        Apr 10, 2022  1:30 PM  (Arrive by 1:15 PM)  RETURN  HEPATOLOGY with Annie Paras, MD  [facility] GI MEDICINE [place] [facility] REGION) [address]  [place] [address]  [phone]        Apr 18, 2022 11:00 AM  (Arrive by 10:30 AM)  ALBUMIN 90 with SURTRA INFUSION CHAIR 5  [facility] TRANSPLANT SURGERY [facility] REGION) [address]  [place] [address]  [phone]        May 02, 2022 11:00 AM  (Arrive by 10:30 AM)  ALBUMIN 90 with SURTRA INFUSION CHAIR 4  [facility] TRANSPLANT SURGERY [place] [facility] REGION) [address]  [place] [address]  [phone]        May 16, 2022 11:00 AM  (Arrive by 10:30 AM)  ALBUMIN 90 with SURTRA INFUSION CHAIR 4  [facility] TRANSPLANT SURGERY [place] [facility] [place]) [address]  [place] [address]  [phone]        May 30, 2022 11:00 AM  (Arrive by 10:30 AM)  ALBUMIN 90 with SURTRA INFUSION CHAIR 4  [facility] TRANSPLANT SURGERY [place] [facility] REGION) [address]  [place] [address]  [phone]        Jun 13, 2022 11:00 AM  (Arrive by 10:30 AM)  ALBUMIN 90 with SURTRA INFUSION CHAIR 4  [facility] TRANSPLANT SURGERY [facility] REGION) [address]  [place] [address]  [phone]        Jun 27, 2022 11:00 AM  (Arrive by 10:30 AM)  ALBUMIN 90 with SURTRA INFUSION CHAIR 4  [facility] TRANSPLANT SURGERY [place] [facility] REGION) [address]  [place] [address]  [phone]        Jul 11, 2022 11:00 AM  (Arrive by 10:30 AM)  ALBUMIN 90 with [employer] INFUSION CHAIR 4  [facility] TRANSPLANT SURGERY [place] [facility] REGION) [address]  [place] [address]  [phone]        Jul 25, 2022 11:00 AM  (Arrive by 10:30 AM)  ALBUMIN 90 with [employer] INFUSION CHAIR 4  [facility] TRANSPLANT SURGERY [place] [facility] REGION) [address]  [place] [address]  [phone]             Lindell Spar, MD  Internal Medicine PGY2

## 2022-03-11 NOTE — Unmapped (Signed)
Reviewed donor characteristics/parameters in UNOS with Dr. Celine Mans.   Per Dr. Celine Mans, to update's patient's minimum weight in UNOS to 30kg.   Updated in UNOS.     Updated [name] MELD score in [place] today with the following labs:  Recertification of  MELD is 46 via UNOS    [name] was also noted to have the following Mild Encephalopathy and Controlled Ascites and this was noted in his MELD upgrade today.  Lab Results   Component Value Date    CREATININE 0.88 03/11/2022    NA 129 (L) 03/11/2022    BILITOT 33.5 (H) 03/11/2022    ALBUMIN  03/11/2022      Comment:      Specimen icteric.     INR 2.42 03/11/2022

## 2022-03-11 NOTE — Unmapped (Signed)
Patient discharged home with wife. Reviewed AVS with patient and wife. Reports he has lactulose at home and does not need refill now. Wife transporting home. Patient and wife walking to front entrance. Patient ambulatory.

## 2022-03-12 NOTE — Unmapped (Signed)
Received page from pt's wife saying patient was just released from hospital yesterday.     She mentioned he ate breakfast this morning and no issues keeping it down.   About 4 hours ago, he had nausea and she gave him zofran. Then he ate lunch a couple of hours ago and vomited it back up. She gave him zofran about 30 minutes ago and paged to see if anything else needed to be done.   Denies fevers and no diarrhea.     Discussed since he was able to keep breakfast/fluids in him this morning and the vomiting just occurred at lunch, for him to wait another 30-45 minutes and try to take sips of fluid since he took a zofran recently. If he tolerates for him to continue to take in sips of fluid through out the day. Mentioned for dinner to try low sodium broth/soup, baked potato or bland food and fluid. Discussed priority is trying to keep fluids in him for hydration.    Mentioned if he cannot keep fluid down as the day progresses, develops fever or abd pain that he needs to be assessed/go to hospital.

## 2022-03-13 DIAGNOSIS — Z7682 Awaiting organ transplant status: Principal | ICD-10-CM

## 2022-03-13 DIAGNOSIS — K7210 Chronic hepatic failure without coma: Principal | ICD-10-CM

## 2022-03-13 LAB — HEPATITIS PANEL, ACUTE
HEPATITIS A IGM ANTIBODY: NONREACTIVE
HEPATITIS B CORE IGM ANTIBODY: NONREACTIVE
HEPATITIS B SURFACE ANTIGEN: NONREACTIVE
HEPATITIS C ANTIBODY: NONREACTIVE

## 2022-03-13 MED ORDER — FUROSEMIDE 20 MG TABLET
ORAL_TABLET | 3 refills | 0 days | Status: CP
Start: 2022-03-13 — End: ?

## 2022-03-14 ENCOUNTER — Ambulatory Visit: Admit: 2022-03-14 | Discharge: 2022-03-15 | Payer: PRIVATE HEALTH INSURANCE

## 2022-03-14 DIAGNOSIS — K7682 Hepatic encephalopathy: Secondary | ICD-10-CM | POA: Diagnosis not present

## 2022-03-14 DIAGNOSIS — K7460 Unspecified cirrhosis of liver: Secondary | ICD-10-CM | POA: Diagnosis not present

## 2022-03-14 DIAGNOSIS — E119 Type 2 diabetes mellitus without complications: Secondary | ICD-10-CM | POA: Diagnosis not present

## 2022-03-14 DIAGNOSIS — K7581 Nonalcoholic steatohepatitis (NASH): Secondary | ICD-10-CM | POA: Diagnosis not present

## 2022-03-14 LAB — CBC
HEMATOCRIT: 38.5 % — ABNORMAL LOW (ref 39.0–48.0)
HEMOGLOBIN: 13.5 g/dL (ref 12.9–16.5)
MEAN CORPUSCULAR HEMOGLOBIN CONC: 35 g/dL (ref 32.0–36.0)
MEAN CORPUSCULAR HEMOGLOBIN: 34.1 pg — ABNORMAL HIGH (ref 25.9–32.4)
MEAN CORPUSCULAR VOLUME: 97.5 fL — ABNORMAL HIGH (ref 77.6–95.7)
MEAN PLATELET VOLUME: 11.1 fL — ABNORMAL HIGH (ref 6.8–10.7)
PLATELET COUNT: 178 10*9/L (ref 150–450)
RED BLOOD CELL COUNT: 3.95 10*12/L — ABNORMAL LOW (ref 4.26–5.60)
RED CELL DISTRIBUTION WIDTH: 19.5 % — ABNORMAL HIGH (ref 12.2–15.2)
WBC ADJUSTED: 13.3 10*9/L — ABNORMAL HIGH (ref 3.6–11.2)

## 2022-03-14 LAB — COMPREHENSIVE METABOLIC PANEL
ALBUMIN: 2.4 g/dL — ABNORMAL LOW (ref 3.4–5.0)
ALKALINE PHOSPHATASE: 206 U/L — ABNORMAL HIGH (ref 46–116)
ALT (SGPT): 149 U/L — ABNORMAL HIGH (ref 10–49)
AST (SGOT): 268 U/L — ABNORMAL HIGH (ref <=34)
BILIRUBIN TOTAL: 33 mg/dL — ABNORMAL HIGH (ref 0.3–1.2)
CALCIUM: 8.8 mg/dL (ref 8.7–10.4)
CHLORIDE: 93 mmol/L — ABNORMAL LOW (ref 98–107)
CREATININE: 0.69 mg/dL
EGFR CKD-EPI (2021) MALE: >90 mL/min/{1.73_m2} (ref >=60)
POTASSIUM: 5 mmol/L — ABNORMAL HIGH (ref 3.4–4.8)
PROTEIN TOTAL: 7.6 g/dL (ref 5.7–8.2)
SODIUM: 120 mmol/L — ABNORMAL LOW (ref 135–145)

## 2022-03-14 LAB — PROTIME-INR
INR: 2.02
PROTIME: 23.5 s — ABNORMAL HIGH (ref 9.8–12.8)

## 2022-03-14 MED ORDER — TRAZODONE 50 MG TABLET
ORAL_TABLET | Freq: Every evening | ORAL | 3 refills | 30 days | Status: CP
Start: 2022-03-14 — End: ?

## 2022-03-14 MED ADMIN — albumin human 25 % 25 % bottle 50 g: 50 g | INTRAVENOUS | @ 17:00:00 | Stop: 2022-03-14

## 2022-03-14 MED ADMIN — albumin human 25 % 25 % bottle 25 g: 25 g | INTRAVENOUS | @ 19:00:00 | Stop: 2022-03-14

## 2022-03-14 NOTE — Unmapped (Signed)
MELD-Na: 32 at 03/14/2022 12:20 PM  MELD: 27 at 03/14/2022 12:20 PM  Calculated from:  Serum Creatinine: 0.69 mg/dL (Using min of 1 mg/dL) at 11/11/1094 04:54 PM  Serum Sodium: 120 mmol/L (Using min of 125 mmol/L) at 03/14/2022 12:20 PM  Total Bilirubin: 33.0 mg/dL at 0/07/8118 14:78 PM  INR(ratio): 2.02 at 03/14/2022 12:20 PM

## 2022-03-14 NOTE — Unmapped (Signed)
Call placed to make pt aware of opening for albumin and lasix infusion today. Pt plans to arrive at 12. Notified infusion RNs. Pt appreciative of call

## 2022-03-14 NOTE — Unmapped (Signed)
1210 Patient arrived to the Transplant Infusion Room today for albumin, Condition: not well today; Mobility: via wheelchair; accompanied by spouse.   See Flowsheet and MAR for all details of visit.   1213 VS stable.  1228 PIV placed and secured with coban, labs collected and sent, urine no orders found.  1230 Infusion initiated. Patient educated to notify nursing staff if they experience urticaria, erythema, nausea, shortness of breath, nausea, metal taste in mouth, excessive oral or postnasal drainage and any other changes in status which could be side effects from initiating this medication.  1330 Infusion complete.  1515 Additional Albumin 25g IV started as [id number] 1546 Infusion completed  1600 VS stable, PIV removed, patient left clinic today, Condition: well; Mobility: ambulating; accompanied by spouse.    MELD-Na: 32 at 03/14/2022 12:20 PM  MELD: 27 at 03/14/2022 12:20 PM  Calculated from:  Serum Creatinine: 0.69 mg/dL (Using min of 1 mg/dL) at 06/06/1913 78:29 PM  Serum Sodium: 120 mmol/L (Using min of 125 mmol/L) at 03/14/2022 12:20 PM  Total Bilirubin: 33.0 mg/dL at 03/11/2129 86:57 PM  INR(ratio): 2.02 at 03/14/2022 12:20 PM

## 2022-03-14 NOTE — Unmapped (Signed)
Na+ 120 today.  Discussed with Dr Piedad Climes, stop his daily diuretics, gave 75G of Albumin today, repeat labs on Thursday at [facility].     Feeling better than last weekend. No confusion, no cramping.

## 2022-03-16 DIAGNOSIS — Z7682 Awaiting organ transplant status: Secondary | ICD-10-CM | POA: Diagnosis not present

## 2022-03-16 DIAGNOSIS — K7210 Chronic hepatic failure without coma: Secondary | ICD-10-CM | POA: Diagnosis not present

## 2022-03-16 NOTE — Unmapped (Signed)
Returned pt's on-call page - pt was told by [facility] tech that labs obtained may take up to 2 days to result. Pt is worried as he was told his sodium was low and wanted to get results sooner than that and offered to go to PCP office. Informed pt that [facility] and PCP office have the same turn around time. Same day lab results are guaranteed if obtained at [facility] or local hospital. Pt verbalized understanding, will obtain labs at [facility].

## 2022-03-16 NOTE — Unmapped (Signed)
Returned on call page - pt worried as he is not feeling well. Pt c/o bloating and gas pain, feels like I'm pregnant. No N/V or fevers, + flatus. Has not been able to eat since breakfast this morning d/t low appetite and fear of vomiting. Advised pt to try simethicone for symptom relief, report to closest ED if he develops any fever or N/V.

## 2022-03-17 LAB — COMPREHENSIVE METABOLIC PANEL
A/G RATIO: 1.4 (ref 1.2–2.2)
ALBUMIN: 4 g/dL (ref 4.0–5.0)
ALKALINE PHOSPHATASE: 212 IU/L — ABNORMAL HIGH (ref 44–121)
ALT (SGPT): 140 IU/L — ABNORMAL HIGH (ref 0–44)
AST (SGOT): 282 IU/L — ABNORMAL HIGH (ref 0–40)
BILIRUBIN TOTAL (MG/DL) IN SER/PLAS: 37.1 mg/dL (ref 0.0–1.2)
BLOOD UREA NITROGEN: 24 mg/dL (ref 6–24)
BUN / CREAT RATIO: 27 — ABNORMAL HIGH (ref 9–20)
CALCIUM: 9 mg/dL (ref 8.7–10.2)
CHLORIDE: 90 mmol/L — ABNORMAL LOW (ref 96–106)
CO2: 18 mmol/L — ABNORMAL LOW (ref 20–29)
CREATININE: 0.88 mg/dL (ref 0.76–1.27)
GLOBULIN, TOTAL: 2.9 g/dL (ref 1.5–4.5)
GLUCOSE: 165 mg/dL — ABNORMAL HIGH (ref 70–99)
POTASSIUM: 5.6 mmol/L — ABNORMAL HIGH (ref 3.5–5.2)
SODIUM: 123 mmol/L — ABNORMAL LOW (ref 134–144)
TOTAL PROTEIN: 6.9 g/dL (ref 6.0–8.5)

## 2022-03-17 LAB — PROTIME-INR
INR: 1.5 — ABNORMAL HIGH (ref 0.9–1.2)
PROTHROMBIN TIME: 15.9 s — ABNORMAL HIGH (ref 9.1–12.0)

## 2022-03-17 MED ORDER — SODIUM POLYSTYRENE SULFONATE 15 GRAM/60 ML ORAL SUSPENSION
Freq: Once | ORAL | 0 refills | 0 days | Status: CP
Start: 2022-03-17 — End: 2022-03-17

## 2022-03-17 NOTE — Unmapped (Signed)
Received on call page from [facility] for critical value - total bilirubin = 37.1, serum sodium = 123. Note routed to primary [facility] and hepatology provider NP [place].

## 2022-03-17 NOTE — Unmapped (Signed)
0900 Labs reviewed this am . Patient noted to now have Na 123 and K 5.6.  Results routed to providers. Pateint has been off diuretics for almost one week     1500: On call page received from [name]. Call placed and spoke with on call hepatologist Dr. Lovena Neighbours. Review of labs and medications.   Patient to continue holding all diuretics ( confirmed both lasix and aldactone) and have repeat labs Tuesday ( when he comes to clinic for albumin). RX for 15 g Kayexalate sent to local pharmacy ([facility])  Medication not in stock but staff will contact local facilities to obtain for patient.     Per patient - has gained close to 10 lbs and abd is distended though without pain / nausea / vomiting or bleeding. Reviewed s/s that would necessitate an urgent visit to the [facility] ED, he stated understanding.     Message sent to Infusion requesting possible LVP be added to Tuesday's appt.

## 2022-03-18 NOTE — Unmapped (Signed)
[facility] Clinical Assessment & Refill Coordination Note    Derek Boyer, DOB: 1981/05/02  Phone: [phone] (home) [phone] (work)    All above HIPAA information was verified with patient.     Was a Nurse, learning disability used for this call? No    Specialty Medication(s):   Infectious Disease: Xifaxan     Current Outpatient Medications   Medication Sig Dispense Refill    baclofen (LIORESAL) 10 MG tablet Take 3 tablets (30 mg total) by mouth in the morning. 60 tablet 5    furosemide (LASIX) 20 MG tablet TAKE 1 Tablet BY MOUTH ONCE DAILY (take 1 with furosemide 40mg  tablet TO equal 60mg  total daily) 30 tablet 3    lactulose 10 gram/15 mL solution Take 45 mL (30 g total) by mouth Three (3) times a day. Titrate for 4-5 bowel movements per day 900 mL 11    magnesium oxide (MAG-OX) 400 mg (241.3 mg elemental magnesium) tablet TAKE 1 Tablet BY MOUTH ONCE DAILY 90 tablet 3    metFORMIN (GLUCOPHAGE-XR) 500 MG 24 hr tablet Take 500 mg by mouth daily with evening meal.       multivitamin/iron/folic acid (CENTRUM ORAL)       omeprazole (PRILOSEC) 20 MG capsule TAKE ONE CAPSULE BY MOUTH EVERY MORNING BEFORE BREAKFAST 90 capsule 1    ondansetron (ZOFRAN-ODT) 4 MG disintegrating tablet Take 1 tablet (4 mg total) by mouth every eight (8) hours as needed for nausea. 30 tablet 3    ONE-A-DAY MEN'S COMPLETE 240 mcg-30 mcg- 300 mcg Tab take 1 Tablet by mouth at bedtime 100 tablet 0    rifAXIMin (XIFAXAN) 550 mg Tab Take 1 tablet (550 mg total) by mouth Two (2) times a day. 180 tablet 3    spironolactone (ALDACTONE) 100 MG tablet take 1 Tablet by mouth in the morning (take with 50mg  tablet TO equal 150mg  total) 30 tablet 3    traZODone (DESYREL) 50 MG tablet Take 1 tablet (50 mg total) by mouth nightly. 30 tablet 3    zinc gluconate 50 mg (7 mg elemental zinc) tablet Take 50 mg by mouth daily. Taking twice a day       No current facility-administered medications for this visit.        Changes to medications: Derek Boyer Reports stopping the following medications: diuretics are currently being held .  Receiving weekly albumin infusions.    Allergies   Allergen Reactions    Other Dermatitis     Dissolvable suture       Changes to allergies: No    SPECIALTY MEDICATION ADHERENCE     Xifaxan 550 mg: 5 days of medicine on hand       Medication Adherence    Patient reported X missed doses in the last month: 2  Specialty Medication: Xifaxan 550mg   Patient is on additional specialty medications: No  Any gaps in refill history greater than 2 weeks in the last 3 months: no  Demonstrates understanding of importance of adherence: yes  Informant: patient  Provider-estimated medication adherence level: good  Patient is at risk for Non-Adherence: No          Specialty medication(s) dose(s) confirmed: Regimen is correct and unchanged.     Are there any concerns with adherence? No    Adherence counseling provided? Not needed    CLINICAL MANAGEMENT AND INTERVENTION      Clinical Benefit Assessment:    Do you feel the medicine is effective or helping your condition?  Yes    Clinical Benefit counseling provided? Not needed    Adverse Effects Assessment:    Are you experiencing any side effects? No    Are you experiencing difficulty administering your medicine? No    Quality of Life Assessment:      How many days over the past month did your hepatic encephalopathy  keep you from your normal activities? For example, brushing your teeth or getting up in the morning. 0    Have you discussed this with your provider? Not needed    Acute Infection Status:    Acute infections noted within Epic:  No active infections  Patient reported infection: None    Therapy Appropriateness:    Is therapy appropriate and patient progressing towards therapeutic goals? Yes, therapy is appropriate and should be continued    DISEASE/MEDICATION-SPECIFIC INFORMATION      N/A    PATIENT SPECIFIC NEEDS     Does the patient have any physical, cognitive, or cultural barriers? No    Is the patient high risk? No    Does the patient require a Care Management Plan? No   b    SHIPPING     Specialty Medication(s) to be Shipped:   Infectious Disease: Xifaxan    Other medication(s) to be shipped: No additional medications requested for fill at this time     Changes to insurance: No    Delivery Scheduled: Yes, Expected medication delivery date: 03/21/22.     Medication will be delivered via UPS to the confirmed prescription address in [facility].    The patient will receive a drug information handout for each medication shipped and additional FDA Medication Guides as required.  Verified that patient has previously received a Conservation officer, historic buildings and a Surveyor, mining.    The patient or caregiver noted above participated in the development of this care plan and knows that they can request review of or adjustments to the care plan at any time.      All of the patient's questions and concerns have been addressed.    Roderic Palau   [facility] Specialty Pharmacist

## 2022-03-19 ENCOUNTER — Ambulatory Visit: Payer: PRIVATE HEALTH INSURANCE

## 2022-03-19 ENCOUNTER — Emergency Department
Admit: 2022-03-19 | Discharge: 2022-03-20 | Disposition: A | Discharge status: Home / Self Care | Payer: PRIVATE HEALTH INSURANCE | Attending: Emergency Medicine

## 2022-03-19 DIAGNOSIS — R140 Abdominal distension (gaseous): Principal | ICD-10-CM

## 2022-03-19 DIAGNOSIS — R188 Other ascites: Principal | ICD-10-CM

## 2022-03-19 LAB — URINALYSIS WITH MICROSCOPY WITH CULTURE REFLEX
BACTERIA: NONE SEEN /HPF
BLOOD UA: NEGATIVE
GLUCOSE UA: NEGATIVE
HYALINE CASTS: 7 /LPF — ABNORMAL HIGH (ref 0–1)
KETONES UA: NEGATIVE
LEUKOCYTE ESTERASE UA: NEGATIVE
NITRITE UA: NEGATIVE
PH UA: 6 (ref 5.0–9.0)
RBC UA: 1 /HPF (ref <=3)
SPECIFIC GRAVITY UA: 1.024 (ref 1.003–1.030)
SQUAMOUS EPITHELIAL: <1 /HPF (ref 0–5)
UROBILINOGEN UA: <2
WBC UA: 1 /HPF (ref <=2)

## 2022-03-19 LAB — CBC W/ AUTO DIFF
BASOPHILS ABSOLUTE COUNT: 0 10*9/L (ref 0.0–0.1)
BASOPHILS RELATIVE PERCENT: 0.2 %
EOSINOPHILS ABSOLUTE COUNT: 0.2 10*9/L (ref 0.0–0.5)
EOSINOPHILS RELATIVE PERCENT: 2.1 %
HEMATOCRIT: 39.2 % (ref 39.0–48.0)
HEMOGLOBIN: 13.7 g/dL (ref 12.9–16.5)
LYMPHOCYTES ABSOLUTE COUNT: 0.7 10*9/L — ABNORMAL LOW (ref 1.1–3.6)
LYMPHOCYTES RELATIVE PERCENT: 7.5 %
MEAN CORPUSCULAR HEMOGLOBIN CONC: 35.1 g/dL (ref 32.0–36.0)
MEAN CORPUSCULAR HEMOGLOBIN: 34.5 pg — ABNORMAL HIGH (ref 25.9–32.4)
MEAN CORPUSCULAR VOLUME: 98.3 fL — ABNORMAL HIGH (ref 77.6–95.7)
MEAN PLATELET VOLUME: 10.3 fL (ref 6.8–10.7)
MONOCYTES ABSOLUTE COUNT: 1.3 10*9/L — ABNORMAL HIGH (ref 0.3–0.8)
MONOCYTES RELATIVE PERCENT: 13.7 %
NEUTROPHILS ABSOLUTE COUNT: 7 10*9/L (ref 1.8–7.8)
NEUTROPHILS RELATIVE PERCENT: 76.5 %
PLATELET COUNT: 171 10*9/L (ref 150–450)
RED BLOOD CELL COUNT: 3.98 10*12/L — ABNORMAL LOW (ref 4.26–5.60)
RED CELL DISTRIBUTION WIDTH: 20.4 % — ABNORMAL HIGH (ref 12.2–15.2)
WBC ADJUSTED: 9.2 10*9/L (ref 3.6–11.2)

## 2022-03-19 LAB — COMPREHENSIVE METABOLIC PANEL
ALBUMIN: 3 g/dL — ABNORMAL LOW (ref 3.4–5.0)
ALBUMIN: 3.1 g/dL — ABNORMAL LOW (ref 3.4–5.0)
ALKALINE PHOSPHATASE: 233 U/L — ABNORMAL HIGH (ref 46–116)
ALKALINE PHOSPHATASE: 199 U/L — ABNORMAL HIGH (ref 46–116)
ALT (SGPT): 136 U/L — ABNORMAL HIGH (ref 10–49)
AST (SGOT): 250 U/L — ABNORMAL HIGH (ref <=34)
BILIRUBIN TOTAL: 37 mg/dL — ABNORMAL HIGH (ref 0.3–1.2)
BILIRUBIN TOTAL: 32.6 mg/dL — ABNORMAL HIGH (ref 0.3–1.2)
CALCIUM: 8.6 mg/dL — ABNORMAL LOW (ref 8.7–10.4)
CHLORIDE: 97 mmol/L — ABNORMAL LOW (ref 98–107)
CHLORIDE: 97 mmol/L — ABNORMAL LOW (ref 98–107)
CREATININE: 0.92 mg/dL
CREATININE: 0.78 mg/dL
EGFR CKD-EPI (2021) MALE: >90 mL/min/{1.73_m2} (ref >=60)
EGFR CKD-EPI (2021) MALE: >90 mL/min/{1.73_m2} (ref >=60)
POTASSIUM: 4.5 mmol/L (ref 3.4–4.8)
PROTEIN TOTAL: 8.3 g/dL — ABNORMAL HIGH (ref 5.7–8.2)
PROTEIN TOTAL: 7.5 g/dL (ref 5.7–8.2)
SODIUM: 126 mmol/L — ABNORMAL LOW (ref 135–145)
SODIUM: 127 mmol/L — ABNORMAL LOW (ref 135–145)

## 2022-03-19 LAB — APTT
APTT: 33 s (ref 25.1–36.5)
HEPARIN CORRELATION: 0.2

## 2022-03-19 LAB — SLIDE REVIEW

## 2022-03-19 LAB — PROTIME-INR
INR: 2.01
PROTIME: 23.4 s — ABNORMAL HIGH (ref 9.8–12.8)

## 2022-03-19 MED ADMIN — albumin human 25 % bottle 25 g: 25 g | INTRAVENOUS | @ 22:00:00 | Stop: 2022-03-19

## 2022-03-19 MED ADMIN — albumin human 25 % bottle 25 g: 25 g | INTRAVENOUS | @ 21:00:00 | Stop: 2022-03-19

## 2022-03-19 NOTE — Unmapped (Signed)
[facility]  Emergency Department Provider Note       ED Clinical Impression     Final diagnoses:   Abdominal distention (Primary)        Impression, ED Course, Assessment and Plan     Impression:     Patient is a 41 y.o. 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    On exam, the patient is uncomfortable appearing but in no acute distress. VS are grossly unremarkable. Exam remarkable for jaundice. Abdominal distention without TTP.  Benign abdominal exam.    Differential includes increase ascites fluid volume secondary to him being off of his diuretics, not consistent with other etiologies of shortness of breath including pleural effusion, ACS, PE given no tachypnea, tachycardia, hypoxia, no chest pain and notes that his shortness of breath is secondary to his increasing abdominal girth.  Not consistent with SBP given no fevers, no abdominal pain.  His benign abdominal exam.  Not consistent with CHF given no history of heart issues, lungs are clear to auscultation bilaterally    Plan for EKG, basic labs, UA, and chest x-ray. Will contact medicine procedure team to discuss therapeutic paracentesis.     2:45 PM bedside ultrasound with large pocket of ascites fluid in the lower quadrants bilaterally, greater than 2 to 4 cm.    2:58 PM  Spoke with Dr. Janyth Pupa from the procedure team who will come evaluate the patient     3:49 PM  CMP with hyponatremia to 126 improved from prior (123 03/16/2022). Tbili 37 consistent with baseline. CBC with no leukocytosis. Chest x-ray shows low lung volumes with bibasilar atelectasis.  Remainder of labs hemolyzed including potassium.  Will need redraw.    5 PM  Signed out to Dr. Vincent Peyer pending LVP by medicine team, and reassessment.  Anticipate discharge if ascites fluid unremarkable and patient feeling improved symptomatically.  Patient is very much happy with this plan and  wanting to go home if feeling improved  Additional Medical Decision Making       Discussion of Management with other Physicians, QHP or Appropriate Source: Discussion with medicine procedure team detailed above   Independent Interpretation of Studies:   EKG: as below    RADIOLOGY: as below     External Records Reviewed: Patient's most recent discharge summary and Patient's most recent outpatient clinic note  Escalation of Care, Consideration of Admission/Observation/Transfer:  N/A  Social determinants that significantly affected care: None applicable  Prescription drug(s) considered but not prescribed: N/A   Diagnostic tests considered but not performed: N/A   History obtained from other sources: Family    Labs and radiology results that were available during my care of the patient were independently reviewed by me and considered in my medical decision making.    Portions of this record have been created using Scientist, clinical (histocompatibility and immunogenetics). Dictation errors have been sought, but may not have been identified and corrected.  ____________________________________________    Time seen: Mar 19, 2022 1:56 PM     History     Chief Complaint  Abdominal Pain      HPI   [name] is a 41 y.o. 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 Patient reports several days of worsening abdominal swelling and discomfort with associated nausea and mild shortness of breath. He states  his shortness of breath is worsened with laying flat. He also notes a recent 15 lb weight gain over the past week. Of note ,patient has been off of his diuretics (lasix and aldactone) for the past 5 days. He is currently followed by [facility] Transplant surgery and notes he has an appointment scheduled for clinic in 2 days. He has never had a large volume paracentesis performed. He notes history of similar symptoms however states it is typically resoled with taking diuretics. Of note, he was recently admitted 5/4-5/6 for decompensated cirrhosis c/b hepatic encephalopathy. At the time his symptoms resolved during admission and he was discharged on home lactulose and rifaximin. Patient denies fever, chills, vomiting, or diarrhea.           Past Medical History:   Diagnosis Date   ??? Diabetes mellitus (CMS-HCC)        Past Surgical History:   Procedure Laterality Date   ??? PR UPPER GI ENDOSCOPY,DIAGNOSIS N/A 09/10/2020    Procedure: UGI ENDO, INCLUDE ESOPHAGUS, STOMACH, & DUODENUM &/OR JEJUNUM; DX W/WO COLLECTION SPECIMN, BY BRUSH OR WASH;  Surgeon: Janyth Pupa, MD;  [facility];  Service: Gastroenterology       No current facility-administered medications for this encounter.    Current Outpatient Medications:   ???  baclofen (LIORESAL) 10 MG tablet, Take 3 tablets (30 mg total) by mouth in the morning., Disp: 60 tablet, Rfl: 5  ???  furosemide (LASIX) 20 MG tablet, TAKE 1 Tablet BY MOUTH ONCE DAILY (take 1 with furosemide 40mg  tablet TO equal 60mg  total daily), Disp: 30 tablet, Rfl: 3  ???  lactulose 10 gram/15 mL solution, Take 45 mL (30 g total) by mouth Three (3) times a day. Titrate for 4-5 bowel movements per day, Disp: 900 mL, Rfl: 11  ???  magnesium oxide (MAG-OX) 400 mg (241.3 mg elemental magnesium) tablet, TAKE 1 Tablet BY MOUTH ONCE DAILY, Disp: 90 tablet, Rfl: 3  ???  metFORMIN (GLUCOPHAGE-XR) 500 MG 24 hr tablet, Take 500 mg by mouth daily with evening meal. , Disp: , Rfl:   ???  multivitamin/iron/folic acid (CENTRUM ORAL), , Disp: , Rfl:   ???  omeprazole (PRILOSEC) 20 MG capsule, TAKE ONE CAPSULE BY MOUTH EVERY MORNING BEFORE BREAKFAST, Disp: 90 capsule, Rfl: 1  ???  ondansetron (ZOFRAN-ODT) 4 MG disintegrating tablet, Take 1 tablet (4 mg total) by mouth every eight (8) hours as needed for nausea., Disp: 30 tablet, Rfl: 3  ???  ONE-A-DAY MEN'S COMPLETE 240 mcg-30 mcg- 300 mcg Tab, take 1 Tablet by mouth at bedtime, Disp: 100 tablet, Rfl: 0  ??? rifAXIMin (XIFAXAN) 550 mg Tab, Take 1 tablet (550 mg total) by mouth Two (2) times a day., Disp: 180 tablet, Rfl: 3  ???  spironolactone (ALDACTONE) 100 MG tablet, take 1 Tablet by mouth in the morning (take with 50mg  tablet TO equal 150mg  total), Disp: 30 tablet, Rfl: 3  ???  traZODone (DESYREL) 50 MG tablet, Take 1 tablet (50 mg total) by mouth nightly., Disp: 30 tablet, Rfl: 3  ???  zinc gluconate 50 mg (7 mg elemental zinc) tablet, Take 50 mg by mouth daily. Taking twice a day, Disp: , Rfl:     Allergies  Other    No family history on file.    Social History  Social History     Tobacco Use   ??? Smoking status: Never   ??? Smokeless tobacco: Never   Substance Use Topics   ??? Alcohol use:  Not Currently   ??? Drug use: Never            Physical Exam     ED Triage Vitals [03/19/22 1602]   Enc Vitals Group      BP 131/67      Heart Rate 65      SpO2 Pulse       Resp 19      Temp 36.6 ??C (97.9 ??F)      Temp Source Oral      SpO2 95 %      Weight       Height       Head Circumference       Peak Flow       Pain Score       Pain Loc       Pain Edu?       Excl. in GC?        Constitutional: Alert and oriented.Jaundice.  In no acute distress  Eyes: Conjunctivae are icteric.  ENT       Head: Normocephalic and atraumatic.       Nose: No congestion.       Mouth/Throat: Mucous membranes are moist.       Neck: No stridor.  Cardiovascular: Normal rate, regular rhythm.   Respiratory: Normal respiratory effort. Breath sounds are normal.  Gastrointestinal: Abdominal distention without TTP.  Soft and nontender.  Positive fluid wave  Musculoskeletal: No lower extremity tenderness or edema.  Neurologic: Normal speech and language. No gross focal neurologic deficits are appreciated.  Skin:  Skin is warm, dry and intact. No rash noted.  Psychiatric: Mood and affect are normal. Speech and behavior are normal.     EKG     Normal sinus rhythm at 73 bpm, no ST elevations, no ST depressions, no ectopy, no STEMI, no significant change from prior Radiology     XR Chest 2 views   Final Result      Low lung volumes with bibasilar atelectasis.      ED POCUS Right Upper Quadrant    (Results Pending)     Chest x-ray on my read with no pleural effusion, no obvious infiltrate.  Bedside ultrasound was performed by myself and interpreted as above.  I independently visualized these images.     Procedures       Pertinent labs & imaging results that were available during my care of the patient were reviewed by me and considered in my medical decision making (see chart for details).        Documentation assistance was provided by Winferd Humphrey, Scribe, on Mar 19, 2022 at 2:26 PM for Carolee Rota, MD.    Documentation assistance was provided by the scribe in my presence.  The documentation recorded by the scribe has been reviewed by me and accurately reflects the services I personally performed.      Carolee Rota, MD  Clinical Assistant Professor  [facility] Department of Emergency Medicine           Berkley Harvey, MD  03/19/22 [phone]

## 2022-03-19 NOTE — Unmapped (Signed)
Patient hx of cirrhosis of the liver, complaining of abdominal distention and concerned for paracentesis need

## 2022-03-19 NOTE — Unmapped (Signed)
Received on call page. Called pt and he reported increased ascites with shortness of breath and weight gain of 5lbs overnight. Advised pt to go to [facility] ED.

## 2022-03-20 DIAGNOSIS — Z7682 Awaiting organ transplant status: Principal | ICD-10-CM

## 2022-03-20 DIAGNOSIS — K7210 Chronic hepatic failure without coma: Principal | ICD-10-CM

## 2022-03-20 MED FILL — XIFAXAN 550 MG TABLET: ORAL | 30 days supply | Qty: 60 | Fill #3

## 2022-03-20 NOTE — Unmapped (Signed)
Updated [name] MELD score in [place] today with the following labs:  Recertification of  MELD is 31 via UNOS    [name] was also noted to have the following Mild Encephalopathy and Controlled Ascites and this was noted in his MELD upgrade today.  Lab Results   Component Value Date    CREATININE 0.78 03/19/2022    NA 127 (L) 03/19/2022    BILITOT 32.6 (H) 03/19/2022    ALBUMIN 3.1 (L) 03/19/2022    INR 2.01 03/19/2022

## 2022-03-20 NOTE — Unmapped (Signed)
Large Volume Paracentesis Procedure Note    Patient Name:: Derek Boyer  Patient MRN: [id number]    Indications:  Increased abdominal girth    Procedure Details   Informed consent was obtained after explanation of the risks and benefits of the procedure, refer to the consent documentation.    Time-out was performed immediately prior to the procedure.    The head of the bed was placed 30-45 degrees above level and the meniscus of the ascites was evaluated by bedside ultrasound and a large area of ascitic fluid was identified in the left lower quadrant.  The linear probe with color doppler was then used to ensure that a vessel was not located in the proposed needle path.    Local anesthesia with 1 percent lidocaine was introduced subcutaneously then deep to the skin until the parietal peritoneum was anesthetized. A Caldwell needle with a catheter was introduced into this site using the subcutaneous tunneling technique until ascitic fluid was encountered and the catheter was introduced over the needle.  Ascitic fluid and the catheter were removed with minimal bleeding.  A sterile bandage was placed after holding pressure.    Albumin 5%/25% 50 grams was administered at the time of paracentesis.         Findings:  7 liters of yellow ascites fluid was obtained.    The ascites fluid was sent for Cell count and diff and Culture.          Condition:    The patient tolerated the procedure well and remains in the same condition as pre-procedure.    Complications:  None; patient tolerated the procedure well.      Requesting Service: Emergency Medicine    Time Requested: 1410  Time Completed: 1720  Comments: None    Resident(s) Performing Procedure: Raelyn Ensign   Resident Year: PGY3    Significant Labs:  INR   Date Value Ref Range Status   03/19/2022 2.01  Final   03/16/2022 1.5 (H) 0.9 - 1.2 Final     Comment:     Reference interval is for non-anticoagulated patients.  Suggested INR therapeutic range for Vitamin K  antagonist therapy:     Standard Dose (moderate intensity                    therapeutic range):       2.0 - 3.0     Higher intensity therapeutic range       2.5 - 3.5       Prothrombin Time   Date Value Ref Range Status   03/16/2022 15.9 (H) 9.1 - 12.0 sec Final     APTT   Date Value Ref Range Status   03/19/2022 33.0 25.1 - 36.5 sec Final     Platelet   Date Value Ref Range Status   03/19/2022 171 150 - 450 10*9/L Final

## 2022-03-20 NOTE — Unmapped (Signed)
Spoke to patient today for an update.  He had to go to the ED last night for an LVP, he feels significantly better afterward.  He is scheduled for infusion at [facility] tomorrow, will be assessed in person. He is not likely to need another LVP tomorrow but may need one on 5/23 when he returns again for infusion.  Confirmed with infusion staff that they can accommodate both infusion and LVP on 5/23 if needed.

## 2022-03-20 NOTE — Unmapped (Signed)
ED Progress Note    Briefly [name] is a 41 year old male with a past medical history of hepatic cirrhosis secondary to [facility] along with type 2 diabetes presenting with worsening abdominal swelling and discomfort.  Patient was seen by her procedure team and had 7 L of fluid removed from his abdomen.  His BMP shows an improving sodium at 127 and a potassium of 4.5 along with stable liver function test consistent with his cirrhosis.     His fluid analysis shows 94 nucleated cells with low suspicion for infection.    Patient has follow-up on Tuesday.  He desires discharge.  Although he does have hyponatremia he is clinically stable and I believe that his baseline chronically ill state.

## 2022-03-21 ENCOUNTER — Ambulatory Visit: Admit: 2022-03-21 | Discharge: 2022-03-22 | Payer: PRIVATE HEALTH INSURANCE

## 2022-03-21 LAB — COMPREHENSIVE METABOLIC PANEL
ALBUMIN: 3.2 g/dL — ABNORMAL LOW (ref 3.4–5.0)
ALKALINE PHOSPHATASE: 228 U/L — ABNORMAL HIGH (ref 46–116)
ALT (SGPT): 156 U/L — ABNORMAL HIGH (ref 10–49)
AST (SGOT): 291 U/L — ABNORMAL HIGH (ref <=34)
BILIRUBIN TOTAL: 35.1 mg/dL — ABNORMAL HIGH (ref 0.3–1.2)
CALCIUM: 8.6 mg/dL — ABNORMAL LOW (ref 8.7–10.4)
CHLORIDE: 100 mmol/L (ref 98–107)
CREATININE: 0.85 mg/dL
EGFR CKD-EPI (2021) MALE: >90 mL/min/{1.73_m2} (ref >=60)
POTASSIUM: 4.4 mmol/L (ref 3.4–4.8)
PROTEIN TOTAL: 6.2 g/dL (ref 5.7–8.2)
SODIUM: 128 mmol/L — ABNORMAL LOW (ref 135–145)

## 2022-03-21 LAB — SLIDE REVIEW

## 2022-03-21 LAB — CBC W/ AUTO DIFF
BASOPHILS ABSOLUTE COUNT: 0 10*9/L (ref 0.0–0.1)
BASOPHILS RELATIVE PERCENT: 0.2 %
EOSINOPHILS ABSOLUTE COUNT: 0.2 10*9/L (ref 0.0–0.5)
EOSINOPHILS RELATIVE PERCENT: 1.7 %
HEMATOCRIT: 36.6 % — ABNORMAL LOW (ref 39.0–48.0)
HEMOGLOBIN: 13.1 g/dL (ref 12.9–16.5)
LYMPHOCYTES ABSOLUTE COUNT: 0.6 10*9/L — ABNORMAL LOW (ref 1.1–3.6)
LYMPHOCYTES RELATIVE PERCENT: 7.1 %
MEAN CORPUSCULAR HEMOGLOBIN CONC: 35.9 g/dL (ref 32.0–36.0)
MEAN CORPUSCULAR HEMOGLOBIN: 34.5 pg — ABNORMAL HIGH (ref 25.9–32.4)
MEAN CORPUSCULAR VOLUME: 96.2 fL — ABNORMAL HIGH (ref 77.6–95.7)
MEAN PLATELET VOLUME: 9.9 fL (ref 6.8–10.7)
MONOCYTES ABSOLUTE COUNT: 1.5 10*9/L — ABNORMAL HIGH (ref 0.3–0.8)
MONOCYTES RELATIVE PERCENT: 16.8 %
NEUTROPHILS ABSOLUTE COUNT: 6.5 10*9/L (ref 1.8–7.8)
NEUTROPHILS RELATIVE PERCENT: 74.2 %
PLATELET COUNT: 154 10*9/L (ref 150–450)
RED BLOOD CELL COUNT: 3.8 10*12/L — ABNORMAL LOW (ref 4.26–5.60)
RED CELL DISTRIBUTION WIDTH: 19.6 % — ABNORMAL HIGH (ref 12.2–15.2)
WBC ADJUSTED: 8.8 10*9/L (ref 3.6–11.2)

## 2022-03-21 LAB — PROTIME-INR
INR: 1.95
PROTIME: 22.6 s — ABNORMAL HIGH (ref 9.8–12.8)

## 2022-03-21 MED ADMIN — sodium chloride (NS) 0.9 % infusion 500 mL 500 mL: 500 mL | INTRAVENOUS | @ 16:00:00 | Stop: 2022-03-21

## 2022-03-21 MED ADMIN — albumin human 25 % 25 % bottle 50 g: 50 g | INTRAVENOUS | @ 16:00:00 | Stop: 2022-03-21

## 2022-03-21 MED ADMIN — furosemide (LASIX) injection 20 mg: 20 mg | INTRAVENOUS | @ 18:00:00 | Stop: 2022-03-21

## 2022-03-21 NOTE — Unmapped (Signed)
1120 Patient arrived to the Transplant Infusion Room today for albumin and lasix, Condition: not well today; Mobility: ambulating; accompanied by spouse.   See Flowsheet and MAR for all details of visit.   1123 VS stable.  1130 PIV placed and secured with coban, labs collected and sent, urine no orders found.Patient became diaphoretic Vallery Sa NP notified. Patient repositioned  1136 NSS IV Infusion initiated as ordered. Patient educated to notify nursing staff if they experience urticaria, erythema, nausea, shortness of breath, nausea, metal taste in mouth, excessive oral or postnasal drainage and any other changes in status which could be side effects from initiating this medication.  1206 NSS completed  1207 Albumin infusion started  1250 Infusion complete.  1344 Lasix 40 mg IV push administered.  1400 VS stable, PIV removed, patient left clinic today, Condition: well; Mobility: ambulating; accompanied by spouse.    MELD-Na: 32 at 03/21/2022 11:41 AM  MELD: 28 at 03/21/2022 11:41 AM  Calculated from:  Serum Creatinine: 0.85 mg/dL (Using min of 1 mg/dL) at 1/61/0960 45:40 AM  Serum Sodium: 128 mmol/L at 03/21/2022 11:41 AM  Total Bilirubin: 35.1 mg/dL at 9/81/1914 78:29 AM  INR(ratio): 1.95 at 03/21/2022 11:41 AM

## 2022-03-21 NOTE — Unmapped (Signed)
Updated [name] MELD score in [place] today with the following labs:  Recertification of  MELD is 32 via UNOS    Mr. [name] was also noted to have the following Mild Encephalopathy and Controlled Ascites and this was noted in his MELD upgrade today.  Lab Results   Component Value Date    CREATININE 0.85 03/21/2022    NA 128 (L) 03/21/2022    BILITOT 35.1 (H) 03/21/2022    ALBUMIN 3.2 (L) 03/21/2022    INR 1.95 03/21/2022

## 2022-03-23 ENCOUNTER — Ambulatory Visit: Admit: 2022-03-23 | Discharge: 2022-03-24 | Payer: PRIVATE HEALTH INSURANCE

## 2022-03-23 LAB — COMPREHENSIVE METABOLIC PANEL
ALBUMIN: 3.2 g/dL — ABNORMAL LOW (ref 3.4–5.0)
ALKALINE PHOSPHATASE: 208 U/L — ABNORMAL HIGH (ref 46–116)
ALT (SGPT): 148 U/L — ABNORMAL HIGH (ref 10–49)
AST (SGOT): 274 U/L — ABNORMAL HIGH (ref <=34)
BILIRUBIN TOTAL: 37.7 mg/dL — ABNORMAL HIGH (ref 0.3–1.2)
CHLORIDE: 101 mmol/L (ref 98–107)
CREATININE: 0.99 mg/dL
EGFR CKD-EPI (2021) MALE: >90 mL/min/{1.73_m2} (ref >=60)
POTASSIUM: 4.3 mmol/L (ref 3.4–4.8)
SODIUM: 131 mmol/L — ABNORMAL LOW (ref 135–145)

## 2022-03-23 LAB — CBC W/ AUTO DIFF
BASOPHILS ABSOLUTE COUNT: 0 10*9/L (ref 0.0–0.1)
BASOPHILS RELATIVE PERCENT: 0.4 %
EOSINOPHILS ABSOLUTE COUNT: 0.2 10*9/L (ref 0.0–0.5)
EOSINOPHILS RELATIVE PERCENT: 2.4 %
HEMATOCRIT: 36 % — ABNORMAL LOW (ref 39.0–48.0)
HEMOGLOBIN: 12.8 g/dL — ABNORMAL LOW (ref 12.9–16.5)
LYMPHOCYTES ABSOLUTE COUNT: 0.8 10*9/L — ABNORMAL LOW (ref 1.1–3.6)
LYMPHOCYTES RELATIVE PERCENT: 9.3 %
MEAN CORPUSCULAR HEMOGLOBIN CONC: 35.6 g/dL (ref 32.0–36.0)
MEAN CORPUSCULAR HEMOGLOBIN: 34.9 pg — ABNORMAL HIGH (ref 25.9–32.4)
MEAN CORPUSCULAR VOLUME: 98 fL — ABNORMAL HIGH (ref 77.6–95.7)
MEAN PLATELET VOLUME: 9.8 fL (ref 6.8–10.7)
MONOCYTES ABSOLUTE COUNT: 1.2 10*9/L — ABNORMAL HIGH (ref 0.3–0.8)
MONOCYTES RELATIVE PERCENT: 14.6 %
NEUTROPHILS ABSOLUTE COUNT: 6 10*9/L (ref 1.8–7.8)
NEUTROPHILS RELATIVE PERCENT: 73.3 %
PLATELET COUNT: 136 10*9/L — ABNORMAL LOW (ref 150–450)
RED BLOOD CELL COUNT: 3.67 10*12/L — ABNORMAL LOW (ref 4.26–5.60)
RED CELL DISTRIBUTION WIDTH: 19.7 % — ABNORMAL HIGH (ref 12.2–15.2)
WBC ADJUSTED: 8.2 10*9/L (ref 3.6–11.2)

## 2022-03-23 LAB — SLIDE REVIEW

## 2022-03-23 LAB — PROTIME-INR
INR: 1.8
PROTIME: 20.8 s — ABNORMAL HIGH (ref 9.8–12.8)

## 2022-03-23 MED ADMIN — albumin human 25 % 25 % bottle 50 g: 50 g | INTRAVENOUS | @ 16:00:00 | Stop: 2022-03-23

## 2022-03-23 NOTE — Unmapped (Signed)
Paracentesis      Indications:   Diuretic refractory ascites      Procedure:  The risks and benefits of the procedure were explained and informed consent was obtained.  Refer to the consent documentation.  The head of the bed was placed 30-45 degrees above level and the meniscus of the ascites was evaluated by percussion with maneuvers.   Anatomic landmarks were used to mark the intended site of paracentesis in the right lower quadrant.  Ultrasound was used to confirm the site.     Local anesthesia with 1 percent lidocaine was introduced subcutaneously then deep to the skin until the parietal peritoneum was anesthetized. A paracentesis needle with a catheter was introduced into this site until ascitic fluid was encountered and the catheter was introduced over the needle.  Ascitic fluid and the catheter were removed with minimal bleeding.  A sterile bandage was placed after holding pressure.  The patient tolerated the procedure well and remains in the same condition as pre-procedure.    Albumin 25% 50 grams was administered at the time of paracentesis.       Complications:  none     Disposition of specimen(s)    Fluid Type:   Ascitic Fluid   Volume:   6.0 Liters   Character:   clear and yellow   Sent for:   Cell count and diff and Cytology

## 2022-03-23 NOTE — Unmapped (Signed)
Patient seen and examined in infusion room during visit for paracentesis.    General: chronically ill-appearing, jaundiced.  Pulmonary: Mildly increased work-of breathing.  Abdomen: Soft and non-tender but distended with ascites. Small, reducible, non-tender umbilical hernia.    Large volume paracentesis completed with 6L removed. Albumin 25% 50 grams administered.    Plan to increase Lasix to 60 mg daily and spironolactone to 75 mg daily.    Repeat labs during next visit 5/23.    Patient advised to seek care if worsening shortness of breath or any concerning symptoms. He and his wife verbalized understanding.    MELD-Na: 30 at 03/23/2022 11:50 AM  MELD: 27 at 03/23/2022 11:50 AM  Calculated from:  Serum Creatinine: 0.99 mg/dL (Using min of 1 mg/dL) at 1/61/0960 45:40 AM  Serum Sodium: 131 mmol/L at 03/23/2022 11:50 AM  Total Bilirubin: 37.7 mg/dL at 9/81/1914 78:29 AM  INR(ratio): 1.80 at 03/23/2022 11:50 AM

## 2022-03-23 NOTE — Unmapped (Signed)
1145 Patient arrived to the Transplant Infusion Room today for LVP, Condition: not well today, Mobility: via wheelchair, accompanied by spouse.  See Flowsheet and MAR for all details of visit.    1149 VS stable.  1156 PIV placed and secured with coban, labs collected and sent; urine no orders found.  1203 Albumin Infusion initiated.  1215 Provider Ace Gins NP and Edrick Kins PA with patient, Consent signed, Timeout performed.  1300 Albumin Infusion complete.  1307 LVP complete for 6 L of ascitic fluid.  1442 VS stable, PIV removed and site secured with coban. Patient left clinic today, Condition: well, Mobility: via wheelchair, accompanied by spouse.     MELD-Na: 30 at 03/23/2022 11:50 AM  MELD: 27 at 03/23/2022 11:50 AM  Calculated from:  Serum Creatinine: 0.99 mg/dL (Using min of 1 mg/dL) at 1/61/0960 45:40 AM  Serum Sodium: 131 mmol/L at 03/23/2022 11:50 AM  Total Bilirubin: 37.7 mg/dL at 9/81/1914 78:29 AM  INR(ratio): 1.80 at 03/23/2022 11:50 AM

## 2022-03-27 ENCOUNTER — Ambulatory Visit: Admit: 2022-03-27 | Discharge: 2022-04-06 | Disposition: E | Payer: PRIVATE HEALTH INSURANCE

## 2022-03-27 ENCOUNTER — Ambulatory Visit: Admit: 2022-03-27 | Discharge: 2022-03-28 | Payer: PRIVATE HEALTH INSURANCE

## 2022-03-27 ENCOUNTER — Ambulatory Visit: Admit: 2022-03-27 | Discharge: 2022-03-31 | Payer: PRIVATE HEALTH INSURANCE

## 2022-03-27 ENCOUNTER — Encounter: Admit: 2022-03-27 | Discharge: 2022-04-06 | Disposition: E | Payer: PRIVATE HEALTH INSURANCE

## 2022-03-27 DIAGNOSIS — K7210 Chronic hepatic failure without coma: Principal | ICD-10-CM

## 2022-03-27 DIAGNOSIS — Z7682 Awaiting organ transplant status: Principal | ICD-10-CM

## 2022-03-27 DIAGNOSIS — K7460 Unspecified cirrhosis of liver: Principal | ICD-10-CM

## 2022-03-27 DIAGNOSIS — K7290 Hepatic failure, unspecified without coma: Principal | ICD-10-CM

## 2022-03-27 DIAGNOSIS — D72829 Elevated white blood cell count, unspecified: Principal | ICD-10-CM

## 2022-03-27 LAB — COMPREHENSIVE METABOLIC PANEL
ALBUMIN: 3.1 g/dL — ABNORMAL LOW (ref 3.4–5.0)
ALKALINE PHOSPHATASE: 203 U/L — ABNORMAL HIGH (ref 46–116)
ALT (SGPT): 159 U/L — ABNORMAL HIGH (ref 10–49)
AST (SGOT): 285 U/L — ABNORMAL HIGH (ref <=34)
BILIRUBIN TOTAL: 41.5 mg/dL — ABNORMAL HIGH (ref 0.3–1.2)
CHLORIDE: 96 mmol/L — ABNORMAL LOW (ref 98–107)
CREATININE: 1.13 mg/dL — ABNORMAL HIGH
EGFR CKD-EPI (2021) MALE: 83 mL/min/{1.73_m2} (ref >=60)
POTASSIUM: 4.5 mmol/L (ref 3.4–4.8)
SODIUM: 125 mmol/L — ABNORMAL LOW (ref 135–145)

## 2022-03-27 LAB — BLOOD GAS CRITICAL CARE PANEL, VENOUS
BASE EXCESS VENOUS: -3.8 — ABNORMAL LOW (ref -2.0–2.0)
CALCIUM IONIZED VENOUS (MG/DL): 4.89 mg/dL (ref 4.40–5.40)
GLUCOSE WHOLE BLOOD: 136 mg/dL (ref 70–179)
HCO3 VENOUS: 20 mmol/L — ABNORMAL LOW (ref 22–27)
HEMOGLOBIN BLOOD GAS: 12.6 g/dL — ABNORMAL LOW
LACTATE BLOOD VENOUS: 2.1 mmol/L — ABNORMAL HIGH (ref 0.5–1.8)
O2 SATURATION VENOUS: 91.2 % — ABNORMAL HIGH (ref 40.0–85.0)
PCO2 VENOUS: 31 mmHg — ABNORMAL LOW (ref 40–60)
PH VENOUS: 7.43 (ref 7.32–7.43)
PO2 VENOUS: 66 mmHg — ABNORMAL HIGH (ref 30–55)
POTASSIUM WHOLE BLOOD: 4.6 mmol/L (ref 3.4–4.6)
SODIUM WHOLE BLOOD: 128 mmol/L — ABNORMAL LOW (ref 135–145)

## 2022-03-27 LAB — SLIDE REVIEW

## 2022-03-27 LAB — URINALYSIS WITH MICROSCOPY
BLOOD UA: NEGATIVE
GLUCOSE UA: NEGATIVE
KETONES UA: NEGATIVE
LEUKOCYTE ESTERASE UA: NEGATIVE
NITRITE UA: NEGATIVE
PH UA: 5.5 (ref 5.0–9.0)
RBC UA: 2 /HPF (ref <=3)
SPECIFIC GRAVITY UA: 1.022 (ref 1.003–1.030)
SQUAMOUS EPITHELIAL: 1 /HPF (ref 0–5)
UROBILINOGEN UA: <2
WBC UA: 6 /HPF — ABNORMAL HIGH (ref <=2)

## 2022-03-27 LAB — CBC
HEMATOCRIT: 37.4 % — ABNORMAL LOW (ref 39.0–48.0)
HEMOGLOBIN: 13.2 g/dL (ref 12.9–16.5)
MEAN CORPUSCULAR HEMOGLOBIN CONC: 35.4 g/dL (ref 32.0–36.0)
MEAN CORPUSCULAR HEMOGLOBIN: 34.4 pg — ABNORMAL HIGH (ref 25.9–32.4)
MEAN CORPUSCULAR VOLUME: 97.2 fL — ABNORMAL HIGH (ref 77.6–95.7)
MEAN PLATELET VOLUME: 10.5 fL (ref 6.8–10.7)
PLATELET COUNT: 158 10*9/L (ref 150–450)
RED BLOOD CELL COUNT: 3.84 10*12/L — ABNORMAL LOW (ref 4.26–5.60)
RED CELL DISTRIBUTION WIDTH: 19.6 % — ABNORMAL HIGH (ref 12.2–15.2)
WBC ADJUSTED: 12.3 10*9/L — ABNORMAL HIGH (ref 3.6–11.2)

## 2022-03-27 LAB — ADDON DIFFERENTIAL ONLY
BASOPHILS ABSOLUTE COUNT: 0 10*9/L (ref 0.0–0.1)
BASOPHILS RELATIVE PERCENT: 0.3 %
EOSINOPHILS ABSOLUTE COUNT: 0.1 10*9/L (ref 0.0–0.5)
EOSINOPHILS RELATIVE PERCENT: 1.2 %
LYMPHOCYTES ABSOLUTE COUNT: 0.6 10*9/L — ABNORMAL LOW (ref 1.1–3.6)
LYMPHOCYTES RELATIVE PERCENT: 5.3 %
MONOCYTES ABSOLUTE COUNT: 1.6 10*9/L — ABNORMAL HIGH (ref 0.3–0.8)
MONOCYTES RELATIVE PERCENT: 12.9 %
NEUTROPHILS ABSOLUTE COUNT: 9.8 10*9/L — ABNORMAL HIGH (ref 1.8–7.8)
NEUTROPHILS RELATIVE PERCENT: 80.3 %

## 2022-03-27 LAB — PROTIME-INR
INR: 1.89
PROTIME: 21.9 s — ABNORMAL HIGH (ref 9.8–12.8)

## 2022-03-27 MED ADMIN — ondansetron (ZOFRAN) injection 4 mg: 4 mg | INTRAVENOUS | @ 16:00:00 | Stop: 2022-03-27

## 2022-03-27 NOTE — Unmapped (Signed)
Pt brought in by family from clinic - went to have LVP today but labs were abnormal. NA 125 - pt is jaundiced. Awaiting liver transplant.

## 2022-03-27 NOTE — Unmapped (Addendum)
Assessment/Plan:  Patient with decompensated NASH cirrhosis. MELD-Na is 33 today. He was advised to go to the ED for further evaluation/management or leukocytosis and dyspnea.  Recommend Infection workup including blood cultures, CXR, UA.   Ascitic fluid cell count and culture sent.    Subjective:  He reports nausea with vomiting today. He also notes recent cough and shortness of breath, particularly when lying down. He notes worsening abdominal distention as well.    Exam:   General: Chronically-ill appearing, deeply jaundiced.  Pulmonary: mild tachypnea.  Abdomen: Distended with ascites. LLQ ecchymosis.    Lab Results   Component Value Date    WBC 12.3 (H) 03/16/2022    HGB 13.2 03/28/2022    HCT 37.4 (L) 03/22/2022    PLT 158 03/09/2022     Lab Results   Component Value Date    NA 125 (L) 03/24/2022    K 4.5 03/16/2022    CL 96 (L) 04/01/2022    CO2  03/19/2022      Comment:      Specimen icteric.     BUN  03/12/2022      Comment:      Specimen icteric.     CREATININE 1.13 (H) 03/30/2022     Lab Results   Component Value Date    BILITOT 41.5 (H) 03/30/2022    BILIDIR >22.50 (H) 03/11/2022    PROT  04/03/2022      Comment:      Specimen icteric.     ALBUMIN 3.1 (L) 03/06/2022    ALT 159 (H) 03/08/2022    AST 285 (H) 03/19/2022    ALKPHOS 203 (H) 03/07/2022       Lab Results   Component Value Date    PT 21.9 (H) 03/08/2022    INR 1.89 04/04/2022    APTT 33.0 03/19/2022     MELD-Na: 33 at 03/07/2022 12:04 PM  MELD: 29 at 03/22/2022 12:04 PM  Calculated from:  Serum Creatinine: 1.13 mg/dL at 1/61/0960 45:40 PM  Serum Sodium: 125 mmol/L at 03/23/2022 12:04 PM  Total Bilirubin: 41.5 mg/dL at 9/81/1914 78:29 PM  INR(ratio): 1.89 at 04/03/2022 12:04 PM

## 2022-03-27 NOTE — Unmapped (Signed)
Documentation of expanded criteria donor (ECD) and Hepatitis C Virus infected liver offers status    Derek Boyer is listed for liver transplantation.  I had a long discussion with him about considering accepting a liver that is considered expanded criteria donor (ECD) including livers infected with the hepatitis C virus (HCV).      We discussed at length the pros and cons of being open to hearing about ECD liver offers. he understands that all such offers would be biopsied before accepting the liver for transplant.  he understands that the current treatments for hepatitis C are highly successful (>95%) and well-tolerated.  Treatment would be offered shortly after surgery, often as soon as he is able to swallow pills.  he also understands, while treatment is highly successful at curing the hepatitis C (getting rid of the virus from the donated liver permanently), treatment does not guarantee a cure. If treatment were to fail, the hepatitis C could lead to damage of the transplanted liver, and even liver failure eventually.      With respect to Hepatitis C positive organs, the transplant team strongly feels the smaller risk of hepatitis C infection after transplant is far outweighed by his risk of dying on the list while waiting for another liver.     All questions and concerns were addressed. [name] agreed that it would be best to consider ECD livers offered for transplant. Decision to accept such offers will be made case-by-case with the patient and the transplant team.    Status on willingness to consider ECD offers follows:    Anti-HBc livers  Donation after cardiac death  HCV Ab (+)/NAT (-) livers  HCV Ab (+)/NAT (+) livers  [x] Yes  [] Yes  [x] Yes  [x] Yes [] No  [x] No  [] No  [] No         [name]. Sherryll Burger, MD  Assistant Professor of Medicine   [facility]  Division of Gastroenterology & Hepatology  p: [phone] - f: [phone]  E-mail: [email or web address]

## 2022-03-27 NOTE — Unmapped (Addendum)
Paracentesis      Indications:   Diuretic refractory ascites      Procedure:  The risks and benefits of the procedure were explained and informed consent was obtained.  Refer to the consent documentation.  The head of the bed was placed 30-45 degrees above level and the meniscus of the ascites was evaluated by percussion with maneuvers.   Anatomic landmarks were used to mark the intended site of paracentesis in theleft lower quadrant.  Ultrasound was used to confirm the site.     Local anesthesia with 1 percent lidocaine was introduced subcutaneously then deep to the skin until the parietal peritoneum was anesthetized. A parcentesis needle was introduced into this site until ascitic fluid was encountered.  Ascitic fluid was noted to be bloody. The needle was removed with minimal bleeding.  A sterile bandage was placed after holding pressure.  The patient tolerated the procedure well and remains in the same condition as pre-procedure.       Complications:  none     Disposition of specimen(s)    Fluid Type:   Ascitic Fluid   Volume:   8 cc   Character:   clear and bloody   Sent for:   Cell count and diff and Culture

## 2022-03-27 NOTE — Unmapped (Signed)
1159 Patient arrived to the Transplant Infusion Room today for LVP, Condition: not well today, Mobility: via wheelchair, accompanied by spouse.  See Flowsheet and MAR for all details of visit.    1201 VS stable.  1218 PIV placed and secured with coban, labs collected and sent; urine no orders found.  1220 Provider Edrick Kins PA with patient  1228 Patient vomiting at present, Zofran 4mg  IV given as ordered  1525 Provider Edrick Kins PA with patient, Consent signed, Timeout performed.  1541 LVP complete for diagnostic  1550 VS stable, PIV rsite secured with coban. Patient left clinic today, Condition: well, Mobility: via wheelchair, accompanied by spouse. Patient transferred to the ED after giving report to [facility] in charge.    MELD-Na: 33 at 03/12/2022 12:04 PM  MELD: 29 at 03/17/2022 12:04 PM  Calculated from:  Serum Creatinine: 1.13 mg/dL at 1/61/0960 45:40 PM  Serum Sodium: 125 mmol/L at 03/12/2022 12:04 PM  Total Bilirubin: 41.5 mg/dL at 9/81/1914 78:29 PM  INR(ratio): 1.89 at 03/14/2022 12:04 PM

## 2022-03-27 NOTE — Unmapped (Signed)
[facility]  Emergency Department Provider Note     ED Clinical Impression     Final diagnoses:   Decompensated hepatic cirrhosis (CMS-HCC) (Primary)   Hypervolemia, unspecified hypervolemia type      Impression, Medical Decision Making, ED Course     Impression: 41 y.o. male who has a past medical history of Diabetes mellitus (CMS-HCC). who presents at the request of his transplant surgery team for admission due to worsening abdominal distention with recent labs showing hyponatremia to 125, creatinine to 1.13, and total bilirubin to 41.5 as described below.     ED Course, Independent Interpretation of Studies, Discussion of Management With Other Providers, Considerations Regarding Disposition/Escalation of Care  ED Course as of 2022/04/10 [name] 04-10-2022   543 41 year old male with history of NASH cirrhosis, currently awaiting liver transplant, who presents with concerns for worsening liver failure.  Patient was seen earlier today in transplant clinic and was told to present to the emergency department for further evaluation given sodium of 125 and worsening labs.    Physical exam is notable for stable vitals, diffuse jaundice, icteric sclera.    Initial clinical impression is for worsening liver failure in setting of Nash cirrhosis.  Considered SBP, though does not have any current symptoms.  Underwent diagnostic paracentesis earlier today with significant number of RBCs, 246 WBCs, patient reports that per hepatologist, will not empirically treat until culture results.    Will page MAO for admission.   1658 Patient is ED services until ADT1.   1823 Discussed plan of care with admitting team, patient to be admitted.   1823 XR Chest Portable  Independently interpreted, left lower lung atelectasis.     ____________________________________________    The case was discussed with the attending physician, who is in agreement with the above assessment and plan.      History     Chief Complaint  Chief Complaint Patient presents with    Abnormal Lab       HPI   Derek Boyer is a 41 y.o. male with past medical history as below who presents with abnormal labs and abdominal distention. The patient reports to the ED at the request of his transplant surgery team for admission due to worsening abdominal distention with recent labs showing hyponatremia to 125, creatinine to 1.13, and total bilirubin to 41.5. He is currently awaiting a liver transplant for decompensated hepatic cirrhosis 2/2 NASH. He was scheduled to have paracentesis today, but they were unable to do this due to his abnormal labs. Per chart review, his last paracentesis was on 03/23/2022 during which time he had 6L of fluid removed. He denies fevers, chills, or any pain at this time.     Outside Historian(s): N/A    External Records Reviewed: I have reviewed recent and relevant previous record, including: Outpatient notes - Transplant infusion note from today discussing the patient's abnormal labs and plan for transfer to ED and Transplant infusion note from 03/23/2022 discussing the patient's most recent paracentesis with removal of 6L of fluid. and ED Provider Notes from 03/19/2022 discussing the patient's recent abdominal distention and 15 lb weight gain in the setting of no longer taking diuretics.    Past Medical History:   Diagnosis Date    Diabetes mellitus (CMS-HCC)        Past Surgical History:   Procedure Laterality Date    PR UPPER GI ENDOSCOPY,DIAGNOSIS N/A 09/10/2020    Procedure: UGI ENDO, INCLUDE ESOPHAGUS, STOMACH, & DUODENUM &/  OR JEJUNUM; DX W/WO COLLECTION SPECIMN, BY BRUSH OR WASH;  Surgeon: Janyth Pupa, MD;  [facility];  Service: Gastroenterology         Current Facility-Administered Medications:     dextrose (D10W) 10% bolus 125 mL, 12.5 g, Intravenous, Q10 Min PRN, Margret Chance, MD    glucagon injection 1 mg, 1 mg, Intramuscular, Once PRN, Margret Chance, MD    glucose chewable tablet 16 g, 16 g, Oral, Q10 Min PRN, Margret Chance, MD    insulin lispro (HumaLOG) injection 0-20 Units, 0-20 Units, Subcutaneous, ACHS, Margret Chance, MD    Current Outpatient Medications:     baclofen (LIORESAL) 10 MG tablet, Take 3 tablets (30 mg total) by mouth in the morning., Disp: 60 tablet, Rfl: 5    furosemide (LASIX) 20 MG tablet, TAKE 1 Tablet BY MOUTH ONCE DAILY (take 1 with furosemide 40mg  tablet TO equal 60mg  total daily), Disp: 30 tablet, Rfl: 3    lactulose 10 gram/15 mL solution, Take 45 mL (30 g total) by mouth Three (3) times a day. Titrate for 4-5 bowel movements per day, Disp: 900 mL, Rfl: 11    magnesium oxide (MAG-OX) 400 mg (241.3 mg elemental magnesium) tablet, TAKE 1 Tablet BY MOUTH ONCE DAILY, Disp: 90 tablet, Rfl: 3    metFORMIN (GLUCOPHAGE-XR) 500 MG 24 hr tablet, Take 500 mg by mouth daily with evening meal. , Disp: , Rfl:     multivitamin/iron/folic acid (CENTRUM ORAL), , Disp: , Rfl:     omeprazole (PRILOSEC) 20 MG capsule, TAKE ONE CAPSULE BY MOUTH EVERY MORNING BEFORE BREAKFAST, Disp: 90 capsule, Rfl: 1    ondansetron (ZOFRAN-ODT) 4 MG disintegrating tablet, Take 1 tablet (4 mg total) by mouth every eight (8) hours as needed for nausea., Disp: 30 tablet, Rfl: 3    ONE-A-DAY MEN'S COMPLETE 240 mcg-30 mcg- 300 mcg Tab, take 1 Tablet by mouth at bedtime, Disp: 100 tablet, Rfl: 0    rifAXIMin (XIFAXAN) 550 mg Tab, Take 1 tablet (550 mg total) by mouth Two (2) times a day., Disp: 180 tablet, Rfl: 3    spironolactone (ALDACTONE) 100 MG tablet, take 1 Tablet by mouth in the morning (take with 50mg  tablet TO equal 150mg  total), Disp: 30 tablet, Rfl: 3    zinc gluconate 50 mg (7 mg elemental zinc) tablet, Take 50 mg by mouth daily. Taking twice a day, Disp: , Rfl:     Facility-Administered Medications Ordered in Other Encounters:     albumin human 25 % 25 % bottle 50 g, 50 g, Intravenous, Once, [employer], ANP    Allergies  Other    Family History  History reviewed. No pertinent family history.    Social History  Social History     Tobacco Use    Smoking status: Never    Smokeless tobacco: Never   Substance Use Topics    Alcohol use: Not Currently    Drug use: Never        Physical Exam     VITAL SIGNS:      Vitals:    04-04-22 1608 Apr 04, 2022 1609 Apr 04, 2022 1648   BP:  130/74 140/80   Pulse: 81  76   Resp:  16 16   Temp:  36.9 ??C (98.4 ??F)    TempSrc:  Oral    SpO2: 99%  99%       Constitutional: Alert and oriented. No acute distress.  Eyes: Icteric sclera.   HEENT: Normocephalic and atraumatic.  Conjunctivae clear. No congestion. Moist mucous membranes.   Cardiovascular: Rate as above, regular rhythm. Normal and symmetric distal pulses. Brisk capillary refill. Normal skin turgor.  Respiratory: Normal respiratory effort. Breath sounds are normal. There are no wheezing or crackles heard.  Gastrointestinal: Soft, non-distended, non-tender.  Genitourinary: Deferred.  Musculoskeletal: Non-tender with normal range of motion in all extremities.  Neurologic: Normal speech and language. No gross focal neurologic deficits are appreciated. Patient is moving all extremities equally, face is symmetric at rest and with speech.  Skin: Diffuse jaundice. Skin is warm, dry and intact.  Psychiatric: Mood and affect are normal. Speech and behavior are normal.     Radiology     XR Chest Portable   Final Result      Left lower lung linear atelectasis. Low lung volumes.      US Liver Doppler    (Results Pending)       Pertinent labs & imaging results that were available during my care of the patient were independently interpreted by me and considered in my medical decision making (see chart for details).    Portions of this record have been created using Scientist, clinical (histocompatibility and immunogenetics). Dictation errors have been sought, but may not have been identified and corrected.    Documentation assistance was provided by Johnathan Hausen, Scribe on 04/13/22 at 4:23 PM for Romeo Rabon, MD.    Documentation assistance provided by the above mentioned scribe. I was present during the time the encounter was recorded. The information recorded by the scribe was done at my direction and has been reviewed and validated by me.          Jesse Fall, MD  Resident  April 13, 2022 [phone]

## 2022-03-27 NOTE — Unmapped (Signed)
Updated [name] MELD score in [place] today with the following labs:  Recertification of  MELD is 47 via UNOS    [name] was also noted to have the following Mild Encephalopathy and Moderate Ascites and this was noted in his MELD upgrade today.  Lab Results   Component Value Date    CREATININE 1.13 (H) Apr 06, 2022    NA 125 (L) Apr 06, 2022    BILITOT 41.5 (H) 04/06/22    ALBUMIN 3.1 (L) 2022-04-06    INR 1.89 2022/04/06

## 2022-03-27 NOTE — Unmapped (Cosign Needed)
Internal Medicine (MEDU) History & Physical    Assessment & Plan:   [name] is a 41 y.o. male whose presentation is complicated by decompensated cirrhosis 2/2 NASH c/b hepatic encephalopathy, T2DM, who presented to ED per recommendation of his liver transplant team for leukocytosis/dyspnea, cough, concern for infection, and worsening liver function.     Principal Problem:    Decompensated hepatic cirrhosis (CMS-HCC)  Active Problems:    Dyspnea  Resolved Problems:    * No resolved hospital problems. *    Decompensated NASH Cirrhosis - Ascites - G1EV   History of hepatic encephalopathy  MELD-Na: 33 at 03/29/2022 12:04 PM  MELD: 29 at 03/06/2022 12:04 PM  Calculated from:  Serum Creatinine: 1.13 mg/dL at 1/61/0960 45:40 PM  Serum Sodium: 125 mmol/L at 03/11/2022 12:04 PM  Total Bilirubin: 41.5 mg/dL at 9/81/1914 78:29 PM  INR(ratio): 1.89 at 03/12/2022 12:04 PM  Patient has history of NASH cirrhosis. Endorses 8lb weight gain, reaccumulating ascites, last LVP 5/18. Was holding diuretics per liver but restarted after LVP on lasix 40, spiro 75. He held off today because he felt unwell. Diagnostic para with 246 PMNs, afebrile, mild leukocytosis, otherwise stable and not infectious appearing, low concern for SBP so will hold off on treating, but will obtain full infectious workup. Had recent admission for HE. Has been adherent to lactulose/rifaximin and having ~5 BM daily. No concern for encephalopathy. Hepatic function mildly worsened (t bili 41.5 from 37.7; LFTs increased slightly). Patient does have G1EV (last EGD 09/2020) with no c/f hematemesis, though he has been vomiting when eating which typically occurs when he is volume overloaded. Suspect volume contributing to symptoms but with recent increase in frequency of reaccumulation, will obtain liver dopplers.   -S/p diagnostic para 5/22   -F/up cultures  -Bcx, U/A, Ucx  -Hepatology consult, appreciate recs   -Trend MELD labs   -Liver dopplers   -F/up diuretic plan   -continue lactulose, rifaximin  -Consider LVP inpatient if no c/f sepsis/infection/SBP    Cough - Headache - Leukocytosis - Malaise  Patient endorses recent onset headache, cough, malaise and has leukocytosis of 13. Had RVP with Rhinovirus/enterovirus which is the likely cause of his symptoms. CXR clear.  -Supportive care  -Encourage PO intake  -Tylenol prn    AKI - Hyponatremia  Patient has chronic hyponatremia and is 125 today. Has reported low PO intake over past few days due to feeling generally unwell and increased ascites. Patient also recently restarted diuretics which were previously held because of electrolyte imbalances. Patient also has AKI with Cr of 1.13 (baseline 0.8-0.9), which is likely iso viral illness, low PO intake, restarting diuretics.  -Encourage PO intake  -Hold diuretics   -Daily BMP    Chronic Problems  T2DM: holding metformin, SSI, hypoglycemia protocol  Spasms: holding baclofen    The patient's presentation is complicated by the following clinically significant conditions requiring additional evaluation and treatment or having a significant effect of this patient's care: - Hypercoagulable state requiring additional attention to DVT prophylaxis and treatment  - Metabolic Encephalopathy POA requiring further investigation or monitoring  - Hyponatremia POA requiring further investigation, treatment, or monitoring       Checklist:  Diet: Regular Diet  DVT PPx: Lovenox 40mg  q24h  Code Status: Full Code  Dispo: Patient appropriate for Observation based on expectation of ongoing need for hospitalization less than two midnights and/or low intensity of services provided    Chief Concern:   Decompensated hepatic cirrhosis (CMS-HCC)  Subjective:   [name] is a 41 y.o. male with a history of decompensated cirrhosis 2/2 NASH c/b hepatic encephalopathy, T2DM, who presented to ED per recommendation of his liver transplant team for leukocytosis/dyspnea, cough, concern for infection, and worsening liver function.     History obtained from patient and patient's spouse by Dr. Sherryll Burger and Dr. Malachi Bonds.     HPI:  Patient presented from hepatology clinic appointment for LVP, where he was found to have a new productive cough, dyspnea, increase in bilirubin and LFTs, and concern for infection so was sent to the emergency department for evaluation.  A diagnostic paracentesis was taken in clinic with 246 PMNs.  Last LVP was 5/18.    Of note, patient did have recent admission for hepatic encephalopathy earlier this month.  He has been doing well with no further episodes of confusion on lactulose and rifaximin.  His main complaint is discomfort from ascites and this is preventing him from sleeping at night.  He has gained 8 pounds since his last LVP and has been reaccumulating fluid faster than usual over the past several weeks.  He has not been dyspneic, just uncomfortable.  He does report a new productive cough.  He does not have sick contacts but does have a 2-year-old son who goes to daycare no sick symptoms recently.    His wife reports a an episode of rigors, but patient denies subjective fever or chills.  Denies any hematemesis.  Denies abdominal pain.  Denies lower extremity edema. He reports he is undergoing transplant evaluation.    Designated Environmental health practitioner:  Mr. [name] currently has decisional capacity for healthcare decision-making and is able to designate a surrogate healthcare decision maker. Mr. Harriett Rush designated healthcare decision maker(s) is/are Markus Daft (the patient's spouse) as denoted by stated patient preference.    Objective:   Physical Exam:  Temp:  [36.4 ??C (97.6 ??F)-36.9 ??C (98.4 ??F)] 36.9 ??C (98.4 ??F)  Heart Rate:  [73-81] 76  SpO2 Pulse:  [76] 76  Resp:  [16-18] 16  BP: (126-140)/(62-80) 140/80  SpO2:  [98 %-99 %] 99 %    Gen: NAD, converses, jaundiced  Eyes: Sclera icteric, EOMI grossly normal   HENT: atraumatic, normocephalic. Mucous membranes mildly dry.  Neck: trachea midline  Heart: RRR, no m/r/g  Lungs: CTAB, no crackles or wheezes  Abdomen: soft, NT, distended, fluid wave present.   Extremities: No LE edema.  Neuro: grossly symmetric   Skin:  No rashes, lesions on clothed exam  Psych: Alert, oriented    Margret Chance, MD  PGY-1, Internal Medicine

## 2022-03-28 LAB — HEPATIC FUNCTION PANEL
ALBUMIN: 3 g/dL — ABNORMAL LOW (ref 3.4–5.0)
ALKALINE PHOSPHATASE: 185 U/L — ABNORMAL HIGH (ref 46–116)
ALT (SGPT): 156 U/L — ABNORMAL HIGH (ref 10–49)
AST (SGOT): 315 U/L — ABNORMAL HIGH (ref <=34)
BILIRUBIN DIRECT: 34.1 mg/dL — ABNORMAL HIGH (ref 0.00–0.30)
BILIRUBIN TOTAL: 38.8 mg/dL — ABNORMAL HIGH (ref 0.3–1.2)
PROTEIN TOTAL: 7.5 g/dL (ref 5.7–8.2)

## 2022-03-28 LAB — CBC W/ AUTO DIFF
BASOPHILS ABSOLUTE COUNT: 0 10*9/L (ref 0.0–0.1)
BASOPHILS RELATIVE PERCENT: 0.4 %
EOSINOPHILS ABSOLUTE COUNT: 0.2 10*9/L (ref 0.0–0.5)
EOSINOPHILS RELATIVE PERCENT: 1.5 %
HEMATOCRIT: 35.4 % — ABNORMAL LOW (ref 39.0–48.0)
HEMOGLOBIN: 12.8 g/dL — ABNORMAL LOW (ref 12.9–16.5)
LYMPHOCYTES ABSOLUTE COUNT: 0.8 10*9/L — ABNORMAL LOW (ref 1.1–3.6)
LYMPHOCYTES RELATIVE PERCENT: 6.2 %
MEAN CORPUSCULAR HEMOGLOBIN CONC: 36 g/dL (ref 32.0–36.0)
MEAN CORPUSCULAR HEMOGLOBIN: 34.9 pg — ABNORMAL HIGH (ref 25.9–32.4)
MEAN CORPUSCULAR VOLUME: 96.8 fL — ABNORMAL HIGH (ref 77.6–95.7)
MEAN PLATELET VOLUME: 10.2 fL (ref 6.8–10.7)
MONOCYTES ABSOLUTE COUNT: 1.6 10*9/L — ABNORMAL HIGH (ref 0.3–0.8)
MONOCYTES RELATIVE PERCENT: 12.7 %
NEUTROPHILS ABSOLUTE COUNT: 10.2 10*9/L — ABNORMAL HIGH (ref 1.8–7.8)
NEUTROPHILS RELATIVE PERCENT: 79.2 %
PLATELET COUNT: 160 10*9/L (ref 150–450)
RED BLOOD CELL COUNT: 3.66 10*12/L — ABNORMAL LOW (ref 4.26–5.60)
RED CELL DISTRIBUTION WIDTH: 19.4 % — ABNORMAL HIGH (ref 12.2–15.2)
WBC ADJUSTED: 12.9 10*9/L — ABNORMAL HIGH (ref 3.6–11.2)

## 2022-03-28 LAB — BASIC METABOLIC PANEL
CHLORIDE: 97 mmol/L — ABNORMAL LOW (ref 98–107)
CREATININE: 1.24 mg/dL — ABNORMAL HIGH
EGFR CKD-EPI (2021) MALE: 74 mL/min/{1.73_m2} (ref >=60)
POTASSIUM: 4.4 mmol/L (ref 3.4–4.8)
SODIUM: 125 mmol/L — ABNORMAL LOW (ref 135–145)

## 2022-03-28 LAB — PROTIME-INR
INR: 1.85
PROTIME: 21.5 s — ABNORMAL HIGH (ref 9.8–12.8)

## 2022-03-28 LAB — SLIDE REVIEW

## 2022-03-28 LAB — BILIRUBIN, DIRECT: BILIRUBIN DIRECT: 34.1 mg/dL — ABNORMAL HIGH (ref 0.00–0.30)

## 2022-03-28 LAB — BODY FLUID, PATH REVIEW

## 2022-03-28 MED ADMIN — lactulose oral solution: 30 g | ORAL | @ 21:00:00

## 2022-03-28 MED ADMIN — magnesium oxide (MAG-OX) tablet 1 tablet: 1 | ORAL | @ 13:00:00

## 2022-03-28 MED ADMIN — lactulose oral solution: 30 g | ORAL | @ 01:00:00

## 2022-03-28 MED ADMIN — baclofen (LIORESAL) tablet 30 mg: 30 mg | ORAL | @ 01:00:00

## 2022-03-28 MED ADMIN — rifAXIMin (XIFAXAN) tablet 550 mg: 550 mg | ORAL | @ 13:00:00 | Stop: 2023-03-27

## 2022-03-28 MED ADMIN — lactulose oral solution: 30 g | ORAL | @ 13:00:00

## 2022-03-28 MED ADMIN — ondansetron (ZOFRAN-ODT) disintegrating tablet 4 mg: 4 mg | ORAL

## 2022-03-28 MED ADMIN — albumin human 25 % bottle 50 g: 50 g | INTRAVENOUS | @ 21:00:00 | Stop: 2022-03-28

## 2022-03-28 MED ADMIN — pantoprazole (PROTONIX) EC tablet 20 mg: 20 mg | ORAL | @ 13:00:00

## 2022-03-28 MED ADMIN — rifAXIMin (XIFAXAN) tablet 550 mg: 550 mg | ORAL | @ 01:00:00 | Stop: 2023-03-27

## 2022-03-28 MED ADMIN — magnesium oxide (MAG-OX) tablet 1 tablet: 1 | ORAL | @ 01:00:00

## 2022-03-28 MED ADMIN — zinc sulfate (ZINCATE) capsule 220 mg: 220 mg | ORAL | @ 13:00:00

## 2022-03-28 NOTE — Unmapped (Signed)
Bed: 05-A  Expected date:   Expected time:   Means of arrival:   Comments:  charge

## 2022-03-28 NOTE — Unmapped (Signed)
MELD-Na: 33 at 03/28/2022  4:32 AM  MELD: 29 at 03/28/2022  4:32 AM  Calculated from:  Serum Creatinine: 1.24 mg/dL at 1/61/0960  4:54 AM  Serum Sodium: 125 mmol/L at 03/28/2022  4:32 AM  Total Bilirubin: 38.8 mg/dL at 0/98/1191  4:78 AM  INR(ratio): 1.85 at 03/28/2022  4:32 AM

## 2022-03-28 NOTE — Unmapped (Signed)
Hepatology Consult Service   Initial Consultation         Assessment and Recommendations:   Derek Boyer is a 76M w/ decompensated cirrhosis 2/2 NASH (HE, ascites, G1EV), T2DM who presented to ED via Liver clinic for nausea, vomiting, cough, SOB and worsening ascites.    Patient presenting w/ URI sx, found to have worsened LFTs, AKI and rhinovirus +. Infectious workup thusfar (incl dx para) negative. Presume that rhinovirus infection led to decompensation from volume and AKI perspective. Not currently decompensated in other ways. Recommend holding diuretics, LVP, albumin and monitoring Cr. He is listed for and awaiting liver transplant.    Recommendations:  - Daily CBC, CMP, INR  - Currently listed for transplant  - Continue home lactulose and rifaximin  - Hold diuretics pending Cr tomorrow AM  - Med M for LVP today   - Give albumin 25% 8g per 1L removed   - Max 7L    MELD-Na: 33 at 03/28/2022  4:32 AM  MELD: 29 at 03/28/2022  4:32 AM  Calculated from:  Serum Creatinine: 1.24 mg/dL at 2/95/6213  0:86 AM  Serum Sodium: 125 mmol/L at 03/28/2022  4:32 AM  Total Bilirubin: 38.8 mg/dL at 5/78/4696  2:95 AM  INR(ratio): 1.85 at 03/28/2022  4:32 AM    I have communicated recommendations with the patient's primary team    Thank you for involving [place] in the care of your patient. We will continue to follow along with you.    For questions, contact the on-call fellow for the Hepatology Consult Service at [phone].     Reason for Consultation:   The patient is seen in consultation at the request of Jacqualin Combes, MD ([facility] ([facility])) for decompensated cirrhosis.    Subjective:   76M w/ decompensated cirrhosis 2/2 NASH (HE, ascites, G1EV), T2DM who presented to ED via Liver clinic for nausea, vomiting, cough, SOB and worsening ascites.    Patient reports he has had a cough, SOB and URI symptoms for the ~1w. His ascites has been progressively worsening and he was scheduled for outpatient LVP on 5/23. He has been taking his diuretics. He denies GI bleeding. He denies encephalopathy.     Dx para done in clinic negative for SBP. Labs remarkable for positive rhinovirus, mild AKI, INR 1.85.    Objective:   Physical Exam:  Temp:  [36.7 ??C (98 ??F)-36.9 ??C (98.4 ??F)] 36.7 ??C (98.1 ??F)  Heart Rate:  [73-81] 74  SpO2 Pulse:  [76] 76  Resp:  [16-24] 20  BP: (115-140)/(43-80) 131/69  SpO2:  [94 %-99 %] 97 %    Gen: Chronically ill-appearing male in NAD, answers questions appropriately  Eyes: Sclera icteric  Abdomen: Normoactive bowel sounds, soft, NTND, no rebound/guarding, no hepatosplenomegaly  Extremities: No clubbing, cyanosis, or edema in the BLEs  Neuro: Normal speech. No asterixis.   Skin: No rashes, lesions on clothed exam  Psych: Alert, normal mood and affect.     Pertinent Labs/Studies Reviewed and commented on in HPI

## 2022-03-28 NOTE — Unmapped (Cosign Needed)
Internal Medicine (MEDU) Progress Note    Assessment & Plan:   Derek Boyer is a 41 y.o. 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 Infectious workup unremarkable. Now monitoring Cr s/p LVP (6L) 5/23.     Principal Problem:    Decompensated hepatic cirrhosis (CMS-HCC)  Active Problems:    Hepatic encephalopathy (CMS-HCC)    Type 2 diabetes mellitus without complication, without long-term current use of insulin (CMS-HCC)    Dyspnea    Hyponatremia  Resolved Problems:    * No resolved hospital problems. *    ??  Decompensated NASH Cirrhosis - Ascites - G1EV   History of hepatic encephalopathy  MELD-Na: 33 at 03/28/2022  4:32 AM  MELD: 29 at 03/28/2022  4:32 AM  Calculated from:  Serum Creatinine: 1.24 mg/dL at 1/61/0960  4:54 AM  Serum Sodium: 125 mmol/L at 03/28/2022  4:32 AM  Total Bilirubin: 38.8 mg/dL at 0/98/1191  4:78 AM  INR(ratio): 1.85 at 03/28/2022  4:32 AM  Endorses 8lb weight gain, reaccumulating ascites, had LVP on 5/14 (7L) and 5/18 (6L). On lasix 40, spiro 75 as outpatient. Diagnostic para unremarkable. S/p LVP 5/23 with 6L of bloody fluid drained. Liver doppler significant for new hepatofugal flow within posterior right portal vein and left portal vein, suspected 2/2 to known cirrhosis. Infectious workup unremarkable with exception of RPP results (below).   -S/p diagnostic para 5/22              -F/up cultures  -f/u Bcx, Ucx  -Hepatology consult, appreciate recs              -Trend MELD labs              -Hold diuretics              -continue lactulose, rifaximin  ?? -IV albumin 25% 8g per 1L removed    Cough - Headache - Leukocytosis - Rhinovirus/Enterovirus  Patient endorses recent onset headache, cough, malaise and has leukocytosis of 13. Had RVP with Rhinovirus/enterovirus which is the likely cause of his symptoms. CXR clear.  -Supportive care  -Encourage PO intake  -Tylenol prn  ??  AKI - Hyponatremia  Patient has chronic hyponatremia and is 125. Has reported low PO intake over past few days due to feeling generally unwell and increased ascites. Patient also recently restarted diuretics which were previously held because of electrolyte imbalances. Patient also has AKI with Cr of 1.24 (baseline 0.8-0.9), which is likely iso viral illness, low PO intake, diuretics.  -Encourage PO intake  -Hold diuretics   -IV albumin as above  -Daily BMP  ??  Chronic Problems  T2DM: holding metformin, SSI, hypoglycemia protocol  Spasms: holding baclofen    ??    Daily Checklist:  Diet: Regular Diet  DVT PPx: Lovenox 40mg  q24h  Electrolytes: Replete Potassium to >/= 3.6 and Magnesium to >/= 1.8  Code Status: Full Code  Dispo: Goal Discharge: Home    Team Contact Information:   Primary Team: Internal Medicine (MEDU)  Primary Resident: Gibson Ramp, MD  Resident's Pager: [phone] (Gen [facility] Intern - Cliffton Asters)    Interval History:   No acute events overnight. Continues to endorse abdominal pressure. Had LVP done today without any complications.     ROS: Denies abdominal pain, fever, CP, or SOB    Objective:   Temp:  [  36.7 ??C (98 ??F)-37 ??C (98.6 ??F)] 37 ??C (98.6 ??F)  Heart Rate:  [73-81] 74  SpO2 Pulse:  [76] 76  Resp:  [12-24] 12  BP: (115-140)/(43-80) 118/53  SpO2:  [94 %-100 %] 100 %, No intake or output data in the 24 hours ending 03/28/22 1432, No intake/output data recorded.    Gen: NAD, answers questions appropriately, jaundiced  Eyes: sclera icteric, EOMI  HENT: atraumatic, MMM, OP w/o erythema or exudate   Heart: RRR, S1, S2, no M/R/G, no chest wall tenderness  Lungs: CTAB, no crackles or wheezes, no use of accessory muscles  Abdomen: Normoactive bowel sounds, soft, distended, fluid wave present  Extremities: no clubbing, cyanosis, or edema in the BLEs  Psych: AOx4, appropriate mood and affect    Labs/Studies: Labs and Studies from the last 24hrs per EMR and Reviewed    Ward Givens, MD  PGY-1, Internal Medicine

## 2022-03-28 NOTE — Unmapped (Signed)
Care Management  Initial Transition Planning Assessment    Per chart review, patient is a 41 y.o. 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 At baseline patient is independent with ADL's. He does not use home health services or DME [name] will transport home and assist with basic care.               General  Care Manager assessed the patient by : In person interview with patient, In person interview with family, Medical record review, Discussion with Clinical Care team (Cm met with patient and wife at bedside.)  Orientation Level: Oriented X4  Functional level prior to admission: Independent  Reason for referral: Discharge Planning    Contact/Decision Maker  Extended Emergency Contact Information  Primary Emergency Contact: Badami,hayley  Mobile Phone: [phone]  Relation: Spouse    Legal Next of Kin / Guardian / POA / Advance Directives     HCDM (patient stated preference): Markus Daft - Spouse - [phone]    Advance Directive (Medical Treatment)  Does patient have an advance directive covering medical treatment?: Patient does not have advance directive covering medical treatment.  Reason patient does not have an advance directive covering medical treatment:: Patient does not wish to complete one at this time.    Health Care Decision Maker [HCDM] (Medical & Mental Health Treatment)  Healthcare Decision Maker: HCDM documented in the HCDM/Contact Info section.  Information offered on HCDM, Medical & Mental Health advance directives:: Patient declined information.    Advance Directive (Mental Health Treatment)  Does patient have an advance directive covering mental health treatment?: Patient does not have advance directive covering mental health treatment.  Reason patient does not have an advance directive covering mental health treatment:: HCDM documented in the HCDM/Contact Info section.    Readmission Information    Have you been hospitalized in the last 30 days?: No       Patient Information  Lives with: Spouse/significant other, Children Markus Daft and 3 yo son.)    Type of Residence: Private residence (Single level home wit one step to enter.)        Location/Detail: [address]  [place], [place] [address]    Support Systems/Concerns: Spouse    Responsibilities/Dependents at home?: Yes (Describe) (3 yo son)    Home Care services in place prior to admission?: No       Equipment Currently Used at Home: none       Currently receiving outpatient dialysis?: No       Financial Information       Need for financial assistance?: No       Social Determinants of Health  Social Determinants of Health     Financial Resource Strain: Low Risk     Difficulty of Paying Living Expenses: Not very hard   Internet Connectivity: Not on file   Food Insecurity: No Food Insecurity    Worried About Running Out of Food in the Last Year: Never true    Ran Out of Food in the Last Year: Never true   Tobacco Use: Low Risk     Smoking Tobacco Use: Never    Smokeless Tobacco Use: Never  Passive Exposure: Not on file   Housing/Utilities: Low Risk     Within the past 12 months, have you ever stayed: outside, in a car, in a tent, in an overnight shelter, or temporarily in someone else's home (i.e. couch-surfing)?: No    Are you worried about losing your housing?: No    Within the past 12 months, have you been unable to get utilities (heat, electricity) when it was really needed?: No   Alcohol Use: Not on file   Transportation Needs: No Transportation Needs    Lack of Transportation (Medical): No    Lack of Transportation (Non-Medical): No   Substance Use: Not on file   Health Literacy: Not on file   Physical Activity: Not on file   Interpersonal Safety: Not on file   Stress: Not on file   Intimate Partner Violence: Not on file   Depression: Not at risk    PHQ-2 Score: 0   Social Connections: Not on file       Complex Discharge Information    Is patient identified as a difficult/complex discharge?: No    Discharge Needs Assessment  Concerns to be Addressed: discharge planning    Clinical Risk Factors: New Diagnosis, Multiple Diagnoses (Chronic)    Barriers to taking medications: No    Prior overnight hospital stay or ED visit in last 90 days: Yes       Patient's Choice of Community Agency(s): No preference stated.    Anticipated Changes Related to Illness: other (see comments) (Likely need time to recover before resuming usual activities.)    Equipment Needed After Discharge: other (see comments) (CM  will follow for DME needs.)    Discharge Facility/Level of Care Needs: other (see comments) (CM will follow for DC needs.)    Readmission  Risk of Unplanned Readmission Score:  %  Predictive Model Details   No score data available for [facility] Risk of Unplanned Readmission     Readmitted Within the Last 30 Days? (No if blank)   Patient at risk for readmission?: Yes    Discharge Plan  Screen findings are: Discharge planning needs identified or anticipated (Comment). (CM will follow for DC needs.)    Expected Discharge Date:     Expected Transfer from Critical Care:         Patient and/or family were provided with choice of facilities / services that are available and appropriate to meet post hospital care needs?: Other (Comment) (Choice of facilities/services will be provided if deemed medically necessary.)       Initial Assessment complete?: Yes

## 2022-03-28 NOTE — Unmapped (Signed)
Large Volume Paracentesis Procedure Note (CPT [phone])    Pre-procedural Planning     Patient Name:: Derek Boyer  Patient MRN: [id number]    Indications:  Diuretic refractory ascites    Known Bleeding Diathesis: Patient/caregiver denies any known bleeding or platelet disorder.     Antiplatelet Agents: This patient is not on an antiplatelet agent.    Systemic Anticoagulation: This patient is not on full systemic anticoagulation.    Significant Labs:  INR   Date Value Ref Range Status   03/28/2022 1.85  Final   03/16/2022 1.5 (H) 0.9 - 1.2 Final     Comment:     Reference interval is for non-anticoagulated patients.  Suggested INR therapeutic range for Vitamin K  antagonist therapy:     Standard Dose (moderate intensity                    therapeutic range):       2.0 - 3.0     Higher intensity therapeutic range       2.5 - 3.5       PT   Date Value Ref Range Status   03/28/2022 21.5 (H) 9.8 - 12.8 sec Final     APTT   Date Value Ref Range Status   03/19/2022 33.0 25.1 - 36.5 sec Final     Platelet   Date Value Ref Range Status   03/28/2022 160 150 - 450 10*9/L Final       Consent: Informed consent for the procedure was obtained from Patient after explanation of the risks, including bleeding, infection, bowel perforation, and leaking, and benefits, including symptomatic improvement and diagnostic information. Potential alternatives were discussed and all questions answered.  Refer to the consent documentation.     Procedure Details     Time-out was performed immediately prior to the procedure.    The head of the bed was placed 45 degrees above level and a large area of ascitic fluid was identified using ultrasound in the left lower quadrant.  The linear probe with color doppler was used to ensure that a vessel was not located in the proposed needle path.          Skin was cleaned with Chlorhexidine. Local anesthesia with 1 percent lidocaine was introduced subcutaneously then deep to the skin until the parietal peritoneum was anesthetized. A small incision was made at the skin site.  A Caldwell needle with a catheter was introduced into this site using the subcutaneous tunneling technique until ascitic fluid was encountered and the catheter was introduced over the needle.  Ascitic fluid and the catheter were removed with minimal bleeding. Skin glue and Pressure bandage was applied after holding pressure and achieving hemostasis.    Albumin 25% 50 grams was administered at the time of paracentesis.     Findings     6 liters of bloody ascites fluid was obtained.    Condition     The patient tolerated the procedure well and remains in the same condition as pre-procedure.    Complications     None; patient tolerated the procedure well.      Requesting Service: Med Engineer, production (MDU)

## 2022-03-28 NOTE — Unmapped (Signed)
Bed: 70-D  Expected date:   Expected time:   Means of arrival:   Comments:  5

## 2022-03-28 NOTE — Unmapped (Signed)
Problem: Adult Inpatient Plan of Care  Goal: Plan of Care Review  Outcome: Progressing  Goal: Patient-Specific Goal (Individualized)  Outcome: Progressing  Goal: Absence of Hospital-Acquired Illness or Injury  Outcome: Progressing  Intervention: Identify and Manage Fall Risk  Recent Flowsheet Documentation  Taken Apr 15, 2022 2026 by Charlynn Court, RN  Safety Interventions:   low bed   lighting adjusted for tasks/safety   environmental modification  Intervention: Prevent Skin Injury  Recent Flowsheet Documentation  Taken 04/15/22 2026 by Charlynn Court, RN  Skin Protection:   adhesive use limited   tubing/devices free from skin contact  Goal: Optimal Comfort and Wellbeing  Outcome: Progressing  Goal: Readiness for Transition of Care  Outcome: Progressing  Goal: Rounds/Family Conference  Outcome: Progressing     Problem: Infection  Goal: Absence of Infection Signs and Symptoms  Outcome: Progressing     Problem: Diabetes Comorbidity  Goal: Blood Glucose Level Within Targeted Range  Outcome: Progressing     Problem: Pain Acute  Goal: Acceptable Pain Control and Functional Ability  Outcome: Progressing     Problem: Gastrointestinal Complications (Liver Failure)  Goal: Optimal Gastrointestinal Function  Outcome: Progressing     Problem: Impaired Coagulation (Liver Failure)  Goal: Optimal Coagulation Function  Outcome: Progressing     Problem: Pain (Liver Failure)  Goal: Optimal Pain Control  Outcome: Progressing    Alert and oriented, family at bedside, pt complains of pain in abdomen related to fluid. Pt able to move independently. Care plan reviewed, no questions at this time. Bed low to floor, call light in reach, non skid socks on

## 2022-03-28 NOTE — Unmapped (Signed)
Updated [name] MELD score in [place] today with the following labs:  Recertification of  MELD is 34 via [facility]    Derek Boyer was also noted to have the following Mild Encephalopathy and Moderate Ascites and this was noted in his MELD upgrade today.  Lab Results   Component Value Date    CREATININE 1.24 (H) 03/28/2022    NA 125 (L) 03/28/2022    BILITOT 38.8 (H) 03/28/2022    ALBUMIN 3.0 (L) 03/28/2022    INR 1.85 03/28/2022

## 2022-03-28 NOTE — Unmapped (Signed)
Bed: HALL-01A  Expected date:   Expected time:   Means of arrival:   Comments:

## 2022-03-29 LAB — BLOOD GAS CRITICAL CARE PANEL, ARTERIAL
BASE EXCESS ARTERIAL: -16.3 — ABNORMAL LOW (ref -2.0–2.0)
BASE EXCESS ARTERIAL: -15.9 — ABNORMAL LOW (ref -2.0–2.0)
BASE EXCESS ARTERIAL: -17.2 — ABNORMAL LOW (ref -2.0–2.0)
BASE EXCESS ARTERIAL: -14.5 — ABNORMAL LOW (ref -2.0–2.0)
CALCIUM IONIZED ARTERIAL (MG/DL): 4.21 mg/dL — ABNORMAL LOW (ref 4.40–5.40)
CALCIUM IONIZED ARTERIAL (MG/DL): 4.41 mg/dL (ref 4.40–5.40)
CALCIUM IONIZED ARTERIAL (MG/DL): 4.49 mg/dL (ref 4.40–5.40)
CALCIUM IONIZED ARTERIAL (MG/DL): 4.58 mg/dL (ref 4.40–5.40)
GLUCOSE WHOLE BLOOD: 117 mg/dL (ref 70–179)
GLUCOSE WHOLE BLOOD: 138 mg/dL (ref 70–179)
GLUCOSE WHOLE BLOOD: 165 mg/dL (ref 70–179)
GLUCOSE WHOLE BLOOD: 250 mg/dL — ABNORMAL HIGH (ref 70–179)
HCO3 ARTERIAL: 9 mmol/L — ABNORMAL LOW (ref 22–27)
HCO3 ARTERIAL: 10 mmol/L — ABNORMAL LOW (ref 22–27)
HCO3 ARTERIAL: 8 mmol/L — ABNORMAL LOW (ref 22–27)
HCO3 ARTERIAL: 11 mmol/L — ABNORMAL LOW (ref 22–27)
HEMOGLOBIN BLOOD GAS: 8.9 g/dL — ABNORMAL LOW
HEMOGLOBIN BLOOD GAS: 8.8 g/dL — ABNORMAL LOW
HEMOGLOBIN BLOOD GAS: 9 g/dL — ABNORMAL LOW
HEMOGLOBIN BLOOD GAS: 9.1 g/dL — ABNORMAL LOW
LACTATE BLOOD ARTERIAL: 9.6 mmol/L (ref <1.3)
LACTATE BLOOD ARTERIAL: 9.9 mmol/L (ref <1.3)
LACTATE BLOOD ARTERIAL: 13.2 mmol/L (ref <1.3)
LACTATE BLOOD ARTERIAL: 11.8 mmol/L (ref <1.3)
O2 SATURATION ARTERIAL: 97.6 % (ref 94.0–100.0)
O2 SATURATION ARTERIAL: 94.8 % (ref 94.0–100.0)
O2 SATURATION ARTERIAL: 99.1 % (ref 94.0–100.0)
O2 SATURATION ARTERIAL: 97.1 % (ref 94.0–100.0)
PCO2 ARTERIAL: 19 mmHg — CL (ref 35.0–45.0)
PCO2 ARTERIAL: 22.9 mmHg — ABNORMAL LOW (ref 35.0–45.0)
PCO2 ARTERIAL: 18.5 mmHg — CL (ref 35.0–45.0)
PCO2 ARTERIAL: 22 mmHg — ABNORMAL LOW (ref 35.0–45.0)
PH ARTERIAL: 7.3 — ABNORMAL LOW (ref 7.35–7.45)
PH ARTERIAL: 7.26 — ABNORMAL LOW (ref 7.35–7.45)
PH ARTERIAL: 7.28 — ABNORMAL LOW (ref 7.35–7.45)
PH ARTERIAL: 7.31 — ABNORMAL LOW (ref 7.35–7.45)
PO2 ARTERIAL: 98 mmHg (ref 80.0–110.0)
PO2 ARTERIAL: 80.7 mmHg (ref 80.0–110.0)
PO2 ARTERIAL: 129 mmHg — ABNORMAL HIGH (ref 80.0–110.0)
PO2 ARTERIAL: 99.4 mmHg (ref 80.0–110.0)
POTASSIUM WHOLE BLOOD: 5.6 mmol/L — ABNORMAL HIGH (ref 3.4–4.6)
POTASSIUM WHOLE BLOOD: 4.9 mmol/L — ABNORMAL HIGH (ref 3.4–4.6)
POTASSIUM WHOLE BLOOD: 5.5 mmol/L — ABNORMAL HIGH (ref 3.4–4.6)
POTASSIUM WHOLE BLOOD: 5.3 mmol/L — ABNORMAL HIGH (ref 3.4–4.6)
SODIUM WHOLE BLOOD: 127 mmol/L — ABNORMAL LOW (ref 135–145)
SODIUM WHOLE BLOOD: 127 mmol/L — ABNORMAL LOW (ref 135–145)
SODIUM WHOLE BLOOD: 129 mmol/L — ABNORMAL LOW (ref 135–145)
SODIUM WHOLE BLOOD: 122 mmol/L — ABNORMAL LOW (ref 135–145)

## 2022-03-29 LAB — URINALYSIS WITH MICROSCOPY
HYALINE CASTS: 3 /LPF — ABNORMAL HIGH (ref 0–1)
KETONES UA: NEGATIVE
LEUKOCYTE ESTERASE UA: NEGATIVE
NITRITE UA: NEGATIVE
PH UA: 5 (ref 5.0–9.0)
RBC UA: <1 /HPF (ref <=3)
SPECIFIC GRAVITY UA: 1.046 — ABNORMAL HIGH (ref 1.003–1.030)
SQUAMOUS EPITHELIAL: 2 /HPF (ref 0–5)
UROBILINOGEN UA: <2
WBC UA: <1 /HPF (ref <=2)

## 2022-03-29 LAB — D-DIMER, QUANTITATIVE
D-DIMER QUANTITATIVE (CW,ML,HL,HS,CH,JS,JC,RX): 12920 ng{FEU}/mL — ABNORMAL HIGH (ref <=500)
D-DIMER QUANTITATIVE (CW,ML,HL,HS,CH,JS,JC,RX): 14548 ng{FEU}/mL — ABNORMAL HIGH (ref <=500)
D-DIMER QUANTITATIVE (CW,ML,HL,HS,CH,JS,JC,RX): 16632 ng{FEU}/mL — ABNORMAL HIGH (ref <=500)
D-DIMER QUANTITATIVE (CW,ML,HL,HS,CH,JS,JC,RX): 16848 ng{FEU}/mL — ABNORMAL HIGH (ref <=500)
D-DIMER QUANTITATIVE (CW,ML,HL,HS,CH,JS,JC,RX): 15302 ng{FEU}/mL — ABNORMAL HIGH (ref <=500)
D-DIMER QUANTITATIVE (CW,ML,HL,HS,CH,JS,JC,RX): 17544 ng{FEU}/mL — ABNORMAL HIGH (ref <=500)

## 2022-03-29 LAB — BASIC METABOLIC PANEL
CALCIUM: 8.7 mg/dL (ref 8.7–10.4)
CALCIUM: 8.8 mg/dL (ref 8.7–10.4)
CALCIUM: 9.2 mg/dL (ref 8.7–10.4)
CALCIUM: 9.7 mg/dL (ref 8.7–10.4)
CHLORIDE: 98 mmol/L (ref 98–107)
CHLORIDE: 97 mmol/L — ABNORMAL LOW (ref 98–107)
CHLORIDE: 96 mmol/L — ABNORMAL LOW (ref 98–107)
CHLORIDE: 97 mmol/L — ABNORMAL LOW (ref 98–107)
CHLORIDE: 96 mmol/L — ABNORMAL LOW (ref 98–107)
CHLORIDE: 92 mmol/L — ABNORMAL LOW (ref 98–107)
CREATININE: 1.31 mg/dL — ABNORMAL HIGH
CREATININE: 1.51 mg/dL — ABNORMAL HIGH
CREATININE: 1.98 mg/dL — ABNORMAL HIGH
CREATININE: 2.02 mg/dL — ABNORMAL HIGH
CREATININE: 2.16 mg/dL — ABNORMAL HIGH
CREATININE: 2.65 mg/dL — ABNORMAL HIGH
EGFR CKD-EPI (2021) MALE: 70 mL/min/{1.73_m2} (ref >=60)
EGFR CKD-EPI (2021) MALE: 59 mL/min/{1.73_m2} — ABNORMAL LOW (ref >=60)
EGFR CKD-EPI (2021) MALE: 42 mL/min/{1.73_m2} — ABNORMAL LOW (ref >=60)
EGFR CKD-EPI (2021) MALE: 41 mL/min/{1.73_m2} — ABNORMAL LOW (ref >=60)
EGFR CKD-EPI (2021) MALE: 38 mL/min/{1.73_m2} — ABNORMAL LOW (ref >=60)
EGFR CKD-EPI (2021) MALE: 30 mL/min/{1.73_m2} — ABNORMAL LOW (ref >=60)
POTASSIUM: 4.8 mmol/L (ref 3.4–4.8)
POTASSIUM: 5.6 mmol/L — ABNORMAL HIGH (ref 3.4–4.8)
POTASSIUM: 6.4 mmol/L (ref 3.4–4.8)
POTASSIUM: 5.4 mmol/L — ABNORMAL HIGH (ref 3.4–4.8)
POTASSIUM: 6 mmol/L — ABNORMAL HIGH (ref 3.4–4.8)
POTASSIUM: 5.5 mmol/L — ABNORMAL HIGH (ref 3.4–4.8)
SODIUM: 128 mmol/L — ABNORMAL LOW (ref 135–145)
SODIUM: 126 mmol/L — ABNORMAL LOW (ref 135–145)
SODIUM: 126 mmol/L — ABNORMAL LOW (ref 135–145)
SODIUM: 127 mmol/L — ABNORMAL LOW (ref 135–145)
SODIUM: 126 mmol/L — ABNORMAL LOW (ref 135–145)
SODIUM: 124 mmol/L — ABNORMAL LOW (ref 135–145)

## 2022-03-29 LAB — PROTIME-INR
INR: 2.25
INR: 2.41
INR: 3.04
INR: 3.07
INR: 2.46
INR: 2.87
INR: 3.06
INR: 3.24
PROTIME: 26.3 s — ABNORMAL HIGH (ref 9.8–12.8)
PROTIME: 28.3 s — ABNORMAL HIGH (ref 9.8–12.8)
PROTIME: 36 s — ABNORMAL HIGH (ref 9.8–12.8)
PROTIME: 36.3 s — ABNORMAL HIGH (ref 9.8–12.8)
PROTIME: 28.9 s — ABNORMAL HIGH (ref 9.8–12.8)
PROTIME: 33.9 s — ABNORMAL HIGH (ref 9.8–12.8)
PROTIME: 36.2 s — ABNORMAL HIGH (ref 9.8–12.8)
PROTIME: 38.5 s — ABNORMAL HIGH (ref 9.8–12.8)

## 2022-03-29 LAB — CBC W/ AUTO DIFF
BASOPHILS ABSOLUTE COUNT: 0.1 10*9/L (ref 0.0–0.1)
BASOPHILS ABSOLUTE COUNT: 0 10*9/L (ref 0.0–0.1)
BASOPHILS ABSOLUTE COUNT: 0.1 10*9/L (ref 0.0–0.1)
BASOPHILS RELATIVE PERCENT: 0.3 %
BASOPHILS RELATIVE PERCENT: 0.2 %
BASOPHILS RELATIVE PERCENT: 0.2 %
EOSINOPHILS ABSOLUTE COUNT: 0.2 10*9/L (ref 0.0–0.5)
EOSINOPHILS ABSOLUTE COUNT: 0.1 10*9/L (ref 0.0–0.5)
EOSINOPHILS ABSOLUTE COUNT: 0 10*9/L (ref 0.0–0.5)
EOSINOPHILS RELATIVE PERCENT: 1 %
EOSINOPHILS RELATIVE PERCENT: 0.4 %
EOSINOPHILS RELATIVE PERCENT: 0.2 %
HEMATOCRIT: 27.3 % — ABNORMAL LOW (ref 39.0–48.0)
HEMATOCRIT: 24.4 % — ABNORMAL LOW (ref 39.0–48.0)
HEMATOCRIT: 28 % — ABNORMAL LOW (ref 39.0–48.0)
HEMOGLOBIN: 9.8 g/dL — ABNORMAL LOW (ref 12.9–16.5)
HEMOGLOBIN: 8.6 g/dL — ABNORMAL LOW (ref 12.9–16.5)
HEMOGLOBIN: 9.8 g/dL — ABNORMAL LOW (ref 12.9–16.5)
LYMPHOCYTES ABSOLUTE COUNT: 0.6 10*9/L — ABNORMAL LOW (ref 1.1–3.6)
LYMPHOCYTES ABSOLUTE COUNT: 0.7 10*9/L — ABNORMAL LOW (ref 1.1–3.6)
LYMPHOCYTES ABSOLUTE COUNT: 1.1 10*9/L (ref 1.1–3.6)
LYMPHOCYTES RELATIVE PERCENT: 3.7 %
LYMPHOCYTES RELATIVE PERCENT: 3.3 %
LYMPHOCYTES RELATIVE PERCENT: 3.5 %
MEAN CORPUSCULAR HEMOGLOBIN CONC: 36 g/dL (ref 32.0–36.0)
MEAN CORPUSCULAR HEMOGLOBIN CONC: 35.2 g/dL (ref 32.0–36.0)
MEAN CORPUSCULAR HEMOGLOBIN CONC: 35 g/dL (ref 32.0–36.0)
MEAN CORPUSCULAR HEMOGLOBIN: 34.8 pg — ABNORMAL HIGH (ref 25.9–32.4)
MEAN CORPUSCULAR HEMOGLOBIN: 34.5 pg — ABNORMAL HIGH (ref 25.9–32.4)
MEAN CORPUSCULAR HEMOGLOBIN: 33.5 pg — ABNORMAL HIGH (ref 25.9–32.4)
MEAN CORPUSCULAR VOLUME: 96.6 fL — ABNORMAL HIGH (ref 77.6–95.7)
MEAN CORPUSCULAR VOLUME: 98.2 fL — ABNORMAL HIGH (ref 77.6–95.7)
MEAN CORPUSCULAR VOLUME: 95.8 fL — ABNORMAL HIGH (ref 77.6–95.7)
MEAN PLATELET VOLUME: 10.1 fL (ref 6.8–10.7)
MEAN PLATELET VOLUME: 10.2 fL (ref 6.8–10.7)
MEAN PLATELET VOLUME: 10.4 fL (ref 6.8–10.7)
MONOCYTES ABSOLUTE COUNT: 2.3 10*9/L — ABNORMAL HIGH (ref 0.3–0.8)
MONOCYTES ABSOLUTE COUNT: 2.8 10*9/L — ABNORMAL HIGH (ref 0.3–0.8)
MONOCYTES ABSOLUTE COUNT: 3.9 10*9/L — ABNORMAL HIGH (ref 0.3–0.8)
MONOCYTES RELATIVE PERCENT: 13.9 %
MONOCYTES RELATIVE PERCENT: 13.7 %
MONOCYTES RELATIVE PERCENT: 12.2 %
NEUTROPHILS ABSOLUTE COUNT: 13.6 10*9/L — ABNORMAL HIGH (ref 1.8–7.8)
NEUTROPHILS ABSOLUTE COUNT: 17.1 10*9/L — ABNORMAL HIGH (ref 1.8–7.8)
NEUTROPHILS ABSOLUTE COUNT: 26.7 10*9/L — ABNORMAL HIGH (ref 1.8–7.8)
NEUTROPHILS RELATIVE PERCENT: 81.1 %
NEUTROPHILS RELATIVE PERCENT: 82.4 %
NEUTROPHILS RELATIVE PERCENT: 83.9 %
PLATELET COUNT: 218 10*9/L (ref 150–450)
PLATELET COUNT: 241 10*9/L (ref 150–450)
PLATELET COUNT: 291 10*9/L (ref 150–450)
RED BLOOD CELL COUNT: 2.82 10*12/L — ABNORMAL LOW (ref 4.26–5.60)
RED BLOOD CELL COUNT: 2.48 10*12/L — ABNORMAL LOW (ref 4.26–5.60)
RED BLOOD CELL COUNT: 2.92 10*12/L — ABNORMAL LOW (ref 4.26–5.60)
RED CELL DISTRIBUTION WIDTH: 19.6 % — ABNORMAL HIGH (ref 12.2–15.2)
RED CELL DISTRIBUTION WIDTH: 20.1 % — ABNORMAL HIGH (ref 12.2–15.2)
RED CELL DISTRIBUTION WIDTH: 21.6 % — ABNORMAL HIGH (ref 12.2–15.2)
WBC ADJUSTED: 16.8 10*9/L — ABNORMAL HIGH (ref 3.6–11.2)
WBC ADJUSTED: 20.7 10*9/L — ABNORMAL HIGH (ref 3.6–11.2)
WBC ADJUSTED: 31.8 10*9/L — ABNORMAL HIGH (ref 3.6–11.2)

## 2022-03-29 LAB — APTT
APTT: 33.9 s (ref 25.1–36.5)
APTT: 42.9 s — ABNORMAL HIGH (ref 25.1–36.5)
APTT: 43.3 s — ABNORMAL HIGH (ref 25.1–36.5)
APTT: 37.3 s — ABNORMAL HIGH (ref 25.1–36.5)
APTT: 37 s — ABNORMAL HIGH (ref 25.1–36.5)
APTT: 39.2 s — ABNORMAL HIGH (ref 25.1–36.5)
APTT: 39.6 s — ABNORMAL HIGH (ref 25.1–36.5)
HEPARIN CORRELATION: 0.2
HEPARIN CORRELATION: 0.2
HEPARIN CORRELATION: 0.2
HEPARIN CORRELATION: 0.2
HEPARIN CORRELATION: 0.2
HEPARIN CORRELATION: 0.2
HEPARIN CORRELATION: 0.2

## 2022-03-29 LAB — CBC
HEMATOCRIT: 27.1 % — ABNORMAL LOW (ref 39.0–48.0)
HEMATOCRIT: 26.8 % — ABNORMAL LOW (ref 39.0–48.0)
HEMOGLOBIN: 9.4 g/dL — ABNORMAL LOW (ref 12.9–16.5)
HEMOGLOBIN: 9.5 g/dL — ABNORMAL LOW (ref 12.9–16.5)
MEAN CORPUSCULAR HEMOGLOBIN CONC: 34.8 g/dL (ref 32.0–36.0)
MEAN CORPUSCULAR HEMOGLOBIN CONC: 35.6 g/dL (ref 32.0–36.0)
MEAN CORPUSCULAR HEMOGLOBIN: 33.8 pg — ABNORMAL HIGH (ref 25.9–32.4)
MEAN CORPUSCULAR HEMOGLOBIN: 33.9 pg — ABNORMAL HIGH (ref 25.9–32.4)
MEAN CORPUSCULAR VOLUME: 97 fL — ABNORMAL HIGH (ref 77.6–95.7)
MEAN CORPUSCULAR VOLUME: 95.2 fL (ref 77.6–95.7)
MEAN PLATELET VOLUME: 10.1 fL (ref 6.8–10.7)
MEAN PLATELET VOLUME: 10.1 fL (ref 6.8–10.7)
PLATELET COUNT: 291 10*9/L (ref 150–450)
PLATELET COUNT: 289 10*9/L (ref 150–450)
RED BLOOD CELL COUNT: 2.79 10*12/L — ABNORMAL LOW (ref 4.26–5.60)
RED BLOOD CELL COUNT: 2.82 10*12/L — ABNORMAL LOW (ref 4.26–5.60)
RED CELL DISTRIBUTION WIDTH: 21.7 % — ABNORMAL HIGH (ref 12.2–15.2)
RED CELL DISTRIBUTION WIDTH: 21.9 % — ABNORMAL HIGH (ref 12.2–15.2)
WBC ADJUSTED: 35 10*9/L — ABNORMAL HIGH (ref 3.6–11.2)
WBC ADJUSTED: 36.7 10*9/L — ABNORMAL HIGH (ref 3.6–11.2)

## 2022-03-29 LAB — BLOOD GAS CRITICAL CARE PANEL, VENOUS
BASE EXCESS VENOUS: -3.8 — ABNORMAL LOW (ref -2.0–2.0)
CALCIUM IONIZED VENOUS (MG/DL): 4.76 mg/dL (ref 4.40–5.40)
GLUCOSE WHOLE BLOOD: 128 mg/dL (ref 70–179)
HCO3 VENOUS: 21 mmol/L — ABNORMAL LOW (ref 22–27)
HEMOGLOBIN BLOOD GAS: 10.1 g/dL — ABNORMAL LOW (ref 13.50–17.50)
LACTATE BLOOD VENOUS: 2 mmol/L — ABNORMAL HIGH (ref 0.5–1.8)
O2 SATURATION VENOUS: 75.1 % (ref 40.0–85.0)
PCO2 VENOUS: 34 mmHg — ABNORMAL LOW (ref 40–60)
PH VENOUS: 7.39 (ref 7.32–7.43)
PO2 VENOUS: 42 mmHg (ref 30–55)
POTASSIUM WHOLE BLOOD: 4.9 mmol/L — ABNORMAL HIGH (ref 3.4–4.6)
SODIUM WHOLE BLOOD: 124 mmol/L — ABNORMAL LOW (ref 135–145)

## 2022-03-29 LAB — SLIDE REVIEW

## 2022-03-29 LAB — COMPREHENSIVE METABOLIC PANEL
ALBUMIN: 2.9 g/dL — ABNORMAL LOW (ref 3.4–5.0)
ALKALINE PHOSPHATASE: 136 U/L — ABNORMAL HIGH (ref 46–116)
ALT (SGPT): 152 U/L — ABNORMAL HIGH (ref 10–49)
AST (SGOT): 326 U/L — ABNORMAL HIGH (ref <=34)
BILIRUBIN TOTAL: 34.2 mg/dL — ABNORMAL HIGH (ref 0.3–1.2)
CALCIUM: 8.7 mg/dL (ref 8.7–10.4)
CHLORIDE: 98 mmol/L (ref 98–107)
CREATININE: 1.99 mg/dL — ABNORMAL HIGH
EGFR CKD-EPI (2021) MALE: 42 mL/min/{1.73_m2} — ABNORMAL LOW (ref >=60)
POTASSIUM: 6.3 mmol/L (ref 3.4–4.8)
PROTEIN TOTAL: 5.8 g/dL (ref 5.7–8.2)
SODIUM: 127 mmol/L — ABNORMAL LOW (ref 135–145)

## 2022-03-29 LAB — FIBRINOGEN
FIBRINOGEN LEVEL: 217 mg/dL (ref 175–500)
FIBRINOGEN LEVEL: 179 mg/dL (ref 175–500)
FIBRINOGEN LEVEL: 175 mg/dL (ref 175–500)
FIBRINOGEN LEVEL: 224 mg/dL (ref 175–500)
FIBRINOGEN LEVEL: 190 mg/dL (ref 175–500)
FIBRINOGEN LEVEL: 213 mg/dL (ref 175–500)

## 2022-03-29 LAB — BLOOD GAS, VENOUS
BASE EXCESS VENOUS: -15.3 — ABNORMAL LOW (ref -2.0–2.0)
HCO3 VENOUS: 11 mmol/L — ABNORMAL LOW (ref 22–27)
O2 SATURATION VENOUS: 81 % (ref 40.0–85.0)
PCO2 VENOUS: 25 mmHg — ABNORMAL LOW (ref 40–60)
PH VENOUS: 7.24 — ABNORMAL LOW (ref 7.32–7.43)
PO2 VENOUS: 51 mmHg (ref 30–55)

## 2022-03-29 LAB — HIGH SENSITIVITY TROPONIN I - SINGLE: HIGH SENSITIVITY TROPONIN I: 8 ng/L (ref <=53)

## 2022-03-29 LAB — HEPATIC FUNCTION PANEL
ALBUMIN: 2.9 g/dL — ABNORMAL LOW (ref 3.4–5.0)
ALBUMIN: 2.6 g/dL — ABNORMAL LOW (ref 3.4–5.0)
ALKALINE PHOSPHATASE: 166 U/L — ABNORMAL HIGH (ref 46–116)
ALKALINE PHOSPHATASE: 142 U/L — ABNORMAL HIGH (ref 46–116)
ALT (SGPT): 126 U/L — ABNORMAL HIGH (ref 10–49)
ALT (SGPT): 121 U/L — ABNORMAL HIGH (ref 10–49)
AST (SGOT): 246 U/L — ABNORMAL HIGH (ref <=34)
AST (SGOT): 243 U/L — ABNORMAL HIGH (ref <=34)
BILIRUBIN DIRECT: >22.5 mg/dL — ABNORMAL HIGH (ref 0.00–0.30)
BILIRUBIN DIRECT: >22.5 mg/dL — ABNORMAL HIGH (ref 0.00–0.30)
BILIRUBIN TOTAL: 35.5 mg/dL — ABNORMAL HIGH (ref 0.3–1.2)
BILIRUBIN TOTAL: 33 mg/dL — ABNORMAL HIGH (ref 0.3–1.2)
PROTEIN TOTAL: 6.3 g/dL (ref 5.7–8.2)
PROTEIN TOTAL: 5.6 g/dL — ABNORMAL LOW (ref 5.7–8.2)

## 2022-03-29 LAB — POTASSIUM: POTASSIUM: 5.9 mmol/L — ABNORMAL HIGH (ref 3.4–4.8)

## 2022-03-29 LAB — HIGH SENSITIVITY TROPONIN I - 2 HOUR SERIAL
HIGH SENSITIVITY TROPONIN - DELTA (0-2H): 7 ng/L (ref <=7)
HIGH-SENSITIVITY TROPONIN I - 2 HOUR: 120 ng/L (ref <=53)

## 2022-03-29 LAB — PLATELET COUNT
PLATELET COUNT: 198 10*9/L (ref 150–450)
PLATELET COUNT: >274 10*9/L (ref 150–450)
PLATELET COUNT: 291 10*9/L (ref 150–450)
PLATELET COUNT: 289 10*9/L (ref 150–450)

## 2022-03-29 LAB — HEMOGLOBIN AND HEMATOCRIT, BLOOD
HEMATOCRIT: 29.7 % — ABNORMAL LOW (ref 39.0–48.0)
HEMOGLOBIN: 10.6 g/dL — ABNORMAL LOW (ref 12.9–16.5)

## 2022-03-29 LAB — BLOOD GAS, ARTERIAL
BASE EXCESS ARTERIAL: -11.6 — ABNORMAL LOW (ref -2.0–2.0)
HCO3 ARTERIAL: 13 mmol/L — ABNORMAL LOW (ref 22–27)
O2 SATURATION ARTERIAL: 97.5 % (ref 94.0–100.0)
PCO2 ARTERIAL: 25.1 mmHg — ABNORMAL LOW (ref 35.0–45.0)
PH ARTERIAL: 7.34 — ABNORMAL LOW (ref 7.35–7.45)
PO2 ARTERIAL: 97.7 mmHg (ref 80.0–110.0)

## 2022-03-29 LAB — LACTATE, VENOUS, WHOLE BLOOD: LACTATE BLOOD VENOUS: 2.6 mmol/L — ABNORMAL HIGH (ref 0.5–1.8)

## 2022-03-29 LAB — HIGH SENSITIVITY TROPONIN I - SERIAL: HIGH SENSITIVITY TROPONIN I: 113 ng/L (ref <=53)

## 2022-03-29 MED ADMIN — iohexoL (OMNIPAQUE) 350 mg iodine/mL solution 100 mL: 100 mL | INTRAVENOUS | @ 06:00:00 | Stop: 2022-03-29

## 2022-03-29 MED ADMIN — fentaNYL (PF) (SUBLIMAZE) injection 50 mcg: 50 ug | INTRAVENOUS | @ 18:00:00 | Stop: 2022-03-31

## 2022-03-29 MED ADMIN — heparin (porcine) 1000 unit/mL injection 2,000 Units: 2 mL | @ 20:00:00

## 2022-03-29 MED ADMIN — lactated ringers bolus 500 mL: 500 mL | INTRAVENOUS | @ 11:00:00 | Stop: 2022-03-29

## 2022-03-29 MED ADMIN — calcium gluconate in sodium chloride (NS) 0.9% 2 gram/100 mL IVPB 2 g: 2 g | INTRAVENOUS | @ 14:00:00 | Stop: 2022-03-29

## 2022-03-29 MED ADMIN — dextrose (D10W) 10% bolus 250 mL: 25 g | INTRAVENOUS | @ 20:00:00 | Stop: 2022-03-29

## 2022-03-29 MED ADMIN — norepinephrine 8 mg in dextrose 5 % 250 mL (32 mcg/mL) infusion PMB: 0-30 ug/min | INTRAVENOUS | @ 12:00:00

## 2022-03-29 MED ADMIN — ondansetron (ZOFRAN) injection 4 mg: 4 mg | INTRAVENOUS | @ 12:00:00

## 2022-03-29 MED ADMIN — baclofen (LIORESAL) tablet 30 mg: 30 mg | ORAL

## 2022-03-29 MED ADMIN — lactated ringers bolus 1,500 mL: 1500 mL | INTRAVENOUS | @ 12:00:00 | Stop: 2022-03-29

## 2022-03-29 MED ADMIN — lidocaine (LIDODERM) 5 % patch 1 patch: 1 | TRANSDERMAL | @ 04:00:00

## 2022-03-29 MED ADMIN — sodium bicarbonate 150 mEq in dextrose 5 % 1,150 mL continuous infusion: INTRAVENOUS | @ 18:00:00 | Stop: 2022-03-29

## 2022-03-29 MED ADMIN — heparin (porcine) 1000 unit/mL injection 1,200 Units: 1.2 mL | @ 20:00:00

## 2022-03-29 MED ADMIN — norepinephrine bitartrate-D5W 8 mg/250 mL (32 mcg/mL) infusion: INTRAVENOUS | @ 12:00:00 | Stop: 2022-03-29

## 2022-03-29 MED ADMIN — vasopressin 0.2 unit/mL: INTRAVENOUS | @ 12:00:00 | Stop: 2022-03-29

## 2022-03-29 MED ADMIN — vancomycin (VANCOCIN) IVPB 1000 mg (premix): 1000 mg | INTRAVENOUS | @ 21:00:00 | Stop: 2022-04-04

## 2022-03-29 MED ADMIN — sodium chloride (NS) 0.9 % infusion: INTRAVENOUS | @ 12:00:00

## 2022-03-29 MED ADMIN — phytonadione (vitamin K1) (AQUA-MEPHYTON) 10 mg in sodium chloride (NS) 0.9 % 50 mL IVPB: 10 mg | INTRAVENOUS | @ 13:00:00 | Stop: 2022-04-01

## 2022-03-29 MED ADMIN — fentaNYL (PF) (SUBLIMAZE) injection 25 mcg: 25 ug | INTRAVENOUS | @ 17:00:00 | Stop: 2022-03-29

## 2022-03-29 MED ADMIN — cefepime (MAXIPIME) 2 g in sodium chloride 0.9 % (NS) 100 mL IVPB-MBP: 2 g | INTRAVENOUS | @ 20:00:00 | Stop: 2022-04-05

## 2022-03-29 MED ADMIN — fentaNYL (PF) (SUBLIMAZE) 50 mcg/mL injection: INTRAVENOUS | @ 17:00:00 | Stop: 2022-03-29

## 2022-03-29 MED ADMIN — sodium bicarbonate 1 mEq/mL (8.4 %) injection: INTRAVENOUS | @ 18:00:00 | Stop: 2022-03-29

## 2022-03-29 MED ADMIN — desmopressin (DDAVP) 25 mcg in sodium chloride (NS) 0.9 % 50 mL IVPB: 25 ug | INTRAVENOUS | @ 16:00:00 | Stop: 2022-03-29

## 2022-03-29 MED ADMIN — insulin lispro (HumaLOG) injection 0-20 Units: 0-20 [IU] | SUBCUTANEOUS | Stop: 2022-03-29

## 2022-03-29 MED ADMIN — rifAXIMin (XIFAXAN) tablet 550 mg: 550 mg | ORAL | Stop: 2023-03-27

## 2022-03-29 MED ADMIN — albumin human 25 % 25 % bottle 25 g: 25 g | INTRAVENOUS | @ 10:00:00 | Stop: 2022-03-29

## 2022-03-29 MED ADMIN — metroNIDAZOLE (FLAGYL) IVPB 500 mg: 500 mg | INTRAVENOUS | @ 20:00:00 | Stop: 2022-04-05

## 2022-03-29 MED ADMIN — MORPhine 4 mg/mL injection 4 mg: 4 mg | INTRAVENOUS | @ 06:00:00 | Stop: 2022-03-29

## 2022-03-29 MED ADMIN — fentaNYL (PF) (SUBLIMAZE) injection 50 mcg: 50 ug | INTRAVENOUS | @ 23:00:00 | Stop: 2022-03-31

## 2022-03-29 MED ADMIN — lactated ringers bolus 500 mL: 500 mL | INTRAVENOUS | @ 06:00:00 | Stop: 2022-03-29

## 2022-03-29 MED ADMIN — insulin regular (HumuLIN,NovoLIN) injection 10 Units: 10 [IU] | INTRAVENOUS | @ 14:00:00 | Stop: 2022-03-29

## 2022-03-29 MED ADMIN — dextrose (D10W) 10% bolus 250 mL: 25 g | INTRAVENOUS | @ 14:00:00 | Stop: 2022-03-29

## 2022-03-29 MED ADMIN — vasopressin 20 units in 100 mL (0.2 units/mL) infusion premade vial: .03 [IU]/min | INTRAVENOUS | @ 12:00:00

## 2022-03-29 MED ADMIN — lactulose oral solution: 30 g | ORAL | @ 19:00:00

## 2022-03-29 MED ADMIN — vasopressin 20 units in 100 mL (0.2 units/mL) infusion premade vial: .03 [IU]/min | INTRAVENOUS | @ 21:00:00

## 2022-03-29 MED ADMIN — sodium bicarbonate injection 50 mEq: 50 meq | INTRAVENOUS | @ 18:00:00 | Stop: 2022-03-29

## 2022-03-29 MED ADMIN — fentaNYL (PF) (SUBLIMAZE) injection 50 mcg: 50 ug | INTRAVENOUS | @ 21:00:00 | Stop: 2022-03-31

## 2022-03-29 MED ADMIN — insulin regular (HumuLIN,NovoLIN) injection 5 Units: 5 [IU] | INTRAVENOUS | @ 20:00:00 | Stop: 2022-03-29

## 2022-03-29 MED ADMIN — lidocaine (XYLOCAINE) 10 mg/mL (1 %) injection: SUBCUTANEOUS | @ 15:00:00 | Stop: 2022-03-29

## 2022-03-29 MED ADMIN — ondansetron (ZOFRAN) injection 4 mg: 4 mg | INTRAVENOUS | @ 05:00:00

## 2022-03-29 MED ADMIN — norepinephrine 8 mg in dextrose 5 % 250 mL (32 mcg/mL) infusion PMB: 0-30 ug/min | INTRAVENOUS | @ 18:00:00

## 2022-03-29 MED ADMIN — iohexoL (OMNIPAQUE) 300 mg iodine/mL solution 70 mL: 70 mL | INTRAVENOUS | @ 16:00:00 | Stop: 2022-03-29

## 2022-03-29 MED ADMIN — fentaNYL (PF) (SUBLIMAZE) injection 50 mcg: 50 ug | INTRAVENOUS | @ 19:00:00 | Stop: 2022-03-31

## 2022-03-29 MED ADMIN — carboxymethylcellulose sodium (THERATEARS) 0.25 % ophthalmic solution 1 drop: 1 [drp] | OPHTHALMIC | @ 21:00:00

## 2022-03-29 MED ADMIN — cefTRIAXone (ROCEPHIN) 1,000 mg in sodium chloride 0.9 % (NS) 100 mL IVPB-connector bag: 1000 mg | INTRAVENOUS | @ 08:00:00 | Stop: 2022-03-29

## 2022-03-29 MED ADMIN — thiamine (B-1) injection 200 mg: 200 mg | INTRAVENOUS

## 2022-03-29 MED ADMIN — simethicone (MYLICON) chewable tablet 80 mg: 80 mg | ORAL | @ 04:00:00

## 2022-03-29 MED ADMIN — sodium zirconium cyclosilicate (LOKELMA) packet 10 g: 10 g | ORAL | @ 13:00:00

## 2022-03-29 MED ADMIN — ondansetron (ZOFRAN) injection 4 mg: 4 mg | INTRAVENOUS | @ 19:00:00

## 2022-03-29 MED ADMIN — albumin human 25 % 25 % bottle 25 g: 25 g | INTRAVENOUS | Stop: 2022-03-29

## 2022-03-29 MED ADMIN — prochlorperazine (COMPAZINE) injection 5 mg: 5 mg | INTRAVENOUS | @ 06:00:00 | Stop: 2022-03-29

## 2022-03-29 NOTE — Unmapped (Cosign Needed)
VASCULAR INTERVENTIONAL RADIOLOGY (NEW) INPATIENT CONSULTATION     Requesting Attending Physician: Richrd Prime Fischer, *  Service Requesting Consult: Medical ICU (MDI)    Date of Service: 03/29/2022  Consulting Interventional Radiologist: Andrena Mews MD     HPI:     Reason for consult: Hemorrhagic shock requiring pressors in the setting of a positive CTA revealing bleeding from a right internal iliac artery distribution.     History of Present Illness:   [name] is a 41 y.o. male seen in consultation at the request of Luz Brazen, * for evaluation of intervention upon bleeding likely from a left inferior epigastric artery in the setting of recent paracentesis seen on recent CTA. The patient has a PMHx of  NASH cirrhosis c/b hepatic encephalopathy, T2DM, who presented to ED per recommendation of his liver transplant team for leukocytosis/dyspnea, cough, concern for infection, and worsening liver function. Admit to OBS 5/22. Now s/p LVP 5/23 (6L bloody output, alb 50g given), previous diag para 5/18 in clinic. RRT 5/24 early AM for softer BPs w worsened abd pain central and RLQ w nausea. CTA with subtle arterial bleed at para site lateral to LEFT inf epigastric artery. Patient's hgb decreased from 12.8 yesterday to 8.5 overnight. Patient is currently on pressors.       Medical History:     Past Medical History:  Past Medical History:   Diagnosis Date    Diabetes mellitus (CMS-HCC)    NASH Cirrhosis.     Surgical History:  Past Surgical History:   Procedure Laterality Date    PR UPPER GI ENDOSCOPY,DIAGNOSIS N/A 09/10/2020    Procedure: UGI ENDO, INCLUDE ESOPHAGUS, STOMACH, & DUODENUM &/OR JEJUNUM; DX W/WO COLLECTION SPECIMN, BY BRUSH OR WASH;  Surgeon: Janyth Pupa, MD;  [facility];  Service: Gastroenterology       Family History:  The patient's family history is not on file..    Medications:   Current Facility-Administered Medications   Medication Dose Route Frequency Provider Last Rate Last Admin    baclofen (LIORESAL) tablet 30 mg  30 mg Oral Nightly Horald Pollen, MD   30 mg at 03/28/22 2016    [START ON 03/30/2022] cefTRIAXone (ROCEPHIN) 1,000 mg in sodium chloride 0.9 % (NS) 100 mL IVPB-connector bag  1,000 mg Intravenous Q24H Qurratul-Ayn Dibble, PA        dextrose (D10W) 10% bolus 125 mL  12.5 g Intravenous Q10 Min PRN Horald Pollen, MD        glucagon injection 1 mg  1 mg Intramuscular Once PRN Horald Pollen, MD        glucose chewable tablet 16 g  16 g Oral Q10 Min PRN Horald Pollen, MD        lactated ringers bolus 1,500 mL  1,500 mL Intravenous Once Qurratul-Ayn Dibble, PA   1,500 mL at 03/29/22 0811    lactulose oral solution  30 g Oral BID Horald Pollen, MD   30 g at 03/28/22 1653    lidocaine (LIDODERM) 5 % patch 1 patch  1 patch Transdermal Q24H Horald Pollen, MD   1 patch at 03/28/22 2341    norepinephrine 8 mg in dextrose 5 % 250 mL (32 mcg/mL) infusion PMB  0-30 mcg/min Intravenous Continuous Qurratul-Ayn Dibble, PA 37.5 mL/hr at 03/29/22 0811 20 mcg/min at 03/29/22 0811    ondansetron (ZOFRAN-ODT) disintegrating tablet 4 mg  4 mg Oral Q6H PRN Horald Pollen, MD  Or    ondansetron (ZOFRAN) injection 4 mg  4 mg Intravenous Q6H PRN Horald Pollen, MD   4 mg at 03/29/22 0808    pantoprazole (PROTONIX) EC tablet 20 mg  20 mg Oral Daily Horald Pollen, MD   20 mg at 03/28/22 0841    phenylephrine 0.8 mg/10 mL (80 mcg/mL) injection             phytonadione (vitamin K1) (AQUA-MEPHYTON) 10 mg in sodium chloride (NS) 0.9 % 50 mL IVPB  10 mg Intravenous Daily Honey Lynnae January, ACNP 174 mL/hr at 03/29/22 0853 10 mg at 03/29/22 0853    rifAXIMin (XIFAXAN) tablet 550 mg  550 mg Oral BID Horald Pollen, MD   550 mg at 03/28/22 2016    simethicone (MYLICON) chewable tablet 80 mg  80 mg Oral Q6H PRN Horald Pollen, MD   80 mg at 03/29/22 0020    sodium chloride (NS) 0.9 % infusion Intravenous Continuous Amber Marcy Panning, AGNP   New Bag at 03/29/22 0824    sodium zirconium cyclosilicate (LOKELMA) packet 10 g  10 g Oral Daily Amber Lynn McVey, AGNP   10 g at 03/29/22 0834    vasopressin 20 units in 100 mL (0.2 units/mL) infusion premade vial  0.03 Units/min Intravenous Continuous Amber Lynn McVey, AGNP 9 mL/hr at 03/29/22 0824 0.03 Units/min at 03/29/22 0824    zinc sulfate (ZINCATE) capsule 220 mg  220 mg Oral Daily Horald Pollen, MD   220 mg at 03/28/22 0841       Allergies:  Other    Social History:  Social History     Tobacco Use    Smoking status: Never    Smokeless tobacco: Never   Substance Use Topics    Alcohol use: Not Currently    Drug use: Never       Objective:      Vital Signs:  Temp:  [36 ??C (96.8 ??F)-37 ??C (98.6 ??F)] 36.4 ??C (97.5 ??F)  Heart Rate:  [65-103] 78  SpO2 Pulse:  [71-78] 78  Resp:  [12-21] 13  BP: (90-134)/(24-63) 128/43  MAP (mmHg):  [47-82] 69  A BP-2: (87-124)/(43-55) 124/55  MAP:  [57 mmHg-76 mmHg] 76 mmHg  SpO2:  [97 %-100 %] 99 %    Physical Exam:  ASA Grade: ASA 4 - Patient with severe systemic disease that is a constant threat to life  GEN: Somnelent lying flat in bed  HEENT: Sclera icteric Mallampati Class III.  CARD: Regular rate and rhythm.  PULM: Respirations non-labored.  GI: Distended mildly ttp  NEURO: Somenlent.   SKIN: Jaundice with scattered telangiectasias.       Diagnostic Studies:  I reviewed all pertinent diagnostic studies, including:      Labs:    Recent Labs     03/13/2022  1204 03/28/22  0432 03/28/22  2353 03/29/22  0133 03/29/22  0339 03/29/22  0722   WBC 12.3* 12.9*  --   --  16.8*  --    HGB 13.2 12.8* 10.6*  --  9.8* 8.6*   HCT 37.4* 35.4* 29.7*  --  27.3* 24.4*   PLT 158 160  --  198 218 241     Recent Labs     03/29/22  0010 03/29/22  0133 03/29/22  0339 03/29/22  0722   NA 128* 124* 126* 127*   K 4.8 4.9* 5.6* 6.3*   CL 98  --  97* 98   CREATININE  1.31*  --  1.51* 1.99*     Recent Labs     03/29/22  0010 03/29/22  0339 03/29/22  0722   PROT 6.3 5.6* 5.8   ALBUMIN 2.9* 2.6* 2.9*   AST 246* 243* 326*   ALT 126* 121* 152*   ALKPHOS 166* 142* 136*   BILITOT 35.5* 33.0* 34.2*     Recent Labs     03/28/22  0432 03/29/22  0133 03/29/22  0339 03/29/22  0722   INR 1.85 2.25 2.41  --    APTT  --  33.9  --  42.9*   FIBRINOGEN  --  217  --   --        Imaging:  CTA A/P 03/29/2022 images personally reviewed.    Assessment and Recommendations:     Mr. [name] is a 41 y.o. male with a PMHx of NASH cirrhosis c/b hepatic encephalopathy and ascities. Now s/p LVP 5/23 (6L bloody output, alb 50g given), complicated by arterial bleed at para site lateral to LEFT inf epigastric artery . Patient's hgb decreased from 12.8 yesterday to 8.5 overnight. Patient is currently on pressors.  Patient with an INR of 3, fibrinogen of 217.     Recommendations:  - Arteriogram with possible intervention.  - ICU transport possibly [place].   - Anticipated procedure date: ASAP this morning.   - NPO  - Appreciate MICU recs.     Informed Consent:  This procedure has been fully reviewed with the patient/patient???s authorized representative. The risks, benefits and alternatives have been explained, and the patient/patient???s authorized representative has consented to the procedure.  --The patient will accept blood products in an emergent situation.  --The patient does not have a Do Not Resuscitate order in effect.    The patient was discussed with Dr. Andrena Mews.     Thank you for involving [place] in the care of this patient. Please page the VIR consult pager [phone]) with further questions, concerns, or if new issues arise.    Bing Quarry Phoenix, MD  PGY-5, [facility] VIR

## 2022-03-29 NOTE — Unmapped (Signed)
Arterial Line Insertion Procedure Note     Date of Service: 03/29/2022    Patient Name:: Derek Boyer  Patient MRN: [id number]    Indications: Hemodynamic monitoring    Consent:   I explained the potential benefits and risks of the procedure, including  temporary vascular occlusion, thrombosis, ischemia, hematoma formation, local/catheter-related infection and nerve/tissue damage. I explained potential alternatives. The patient/HCDM understands these risks, agrees to the procedure, and signed the informed consent form.    Procedure Details:   Time-out was performed immediately prior to the procedure to verify correct patient, procedure, site, positioning, and special equipment if applicable.    Freida Busman???s test was performed to ensure adequate perfusion. The patient???s right wrist was prepped and draped in sterile fashion. 1% Lidocaine was used to anesthetize the area. A 20 G Arrow line was introduced into the radial artery with ultrasound guidance. The catheter was threaded over the guide wire and the needle was removed with appropriate pulsatile blood return. The catheter was then sutured in place to the skin and a sterile CHG dressing applied. Perfusion to the extremity distal to the point of catheter insertion was checked and found to be adequate.  A pressure transducer was connected sterilely to the arterial line and an arterial line waveform was noted on the monitor.     Estimated Blood Loss: 1 ml    Condition:  The patient tolerated the procedure well and remains in the same condition as pre-procedure.    Complications:  The patient tolerated the procedure well and there were no complications.    Plan:  - cont hemodynamic monitoring    Tandi Hanko Fonnie Mu, ACNP

## 2022-03-29 NOTE — Unmapped (Signed)
Central Venous Catheter Insertion Procedure Note     Date of Service: 03/29/2022    Patient Name: Derek Boyer  Patient MRN: [id number]    Line type:  Hemodialysis Catheter, trialysis    Indications:  Medications requiring central access and Hemodialysis    Consent:  I explained the potential benefits and risks of the procedure, including  catheter malposition, pneumothorax, cardiac complications, thrombosis, hematoma formation, arterial cannulation, air embolism local/catheter-related infection, nerve/tissue damage and sepsis. I explained potential alternatives. The patient/HCDM understands these risks, agrees to the procedure, and signed the informed consent form.    Procedure Details:   Time-out was performed immediately prior to the procedure.    The right internal jugular vein was identified using bedside ultrasound. This area was prepped and draped in the usual sterile fashion. Maximum sterile technique was used including antiseptics, cap, gloves, gown, hand hygiene, mask, and sterile sheet.  The patient was placed in Trendelenburg position. Local anesthesia with 1% lidocaine was applied subcutaneously then deep to the skin. The finder was then inserted into the internal jugular vein using ultrasound guidance.    Using the Seldinger Technique a Trialysis was placed with each port easily flushed and freely drawing venous blood.    The catheter was secured with sutures. A sterile CHG drsg was applied to the site.    Condition:  The patient tolerated the procedure well and remains in the same condition as pre-procedure.    Complications:  None; patient tolerated the procedure well.    Plan:  CXR confirmed appropriate placement of central line. CVAD order placed OK to use     Toma Aran, PA

## 2022-03-29 NOTE — Unmapped (Signed)
Received notification from Dr. Johna Sheriff that Derek Boyer needs to be placed on hold on the [facility] Liver Waitlist because of the following reason: temporarilty too sick.  Successfully updated Derek Boyer status in [place] to inactive 03/29/22 3:15 PM Dorette Hartel W Zaydon Kinser, RN    Notified patient and family via letter that patient status in [facility]OS on Liver Waitlist changed to waitlist inactive/on hold due to temporarilty too sick. 03/29/22 3:21 PM Trinette Vera Nadyne Coombes, RN

## 2022-03-29 NOTE — Unmapped (Signed)
Updated [name] MELD score in [place] today with the following labs:  Recertification of  MELD is 39 via UNOS    [name] was also noted to have the following Mild Encephalopathy and Moderate Ascites and this was noted in his MELD upgrade today.  Lab Results   Component Value Date    CREATININE 1.98 (H) 03/29/2022    NA 126 (L) 03/29/2022    BILITOT 34.2 (H) 03/29/2022    ALBUMIN 2.9 (L) 03/29/2022    INR 3.07 03/29/2022

## 2022-03-29 NOTE — Unmapped (Signed)
[facility] INTERVENTIONAL RADIOLOGY - Operative Note     VIR Post-Procedure Note    Procedure Name: angiogram with embolization     Pre-Op Diagnosis: bleeding after paracentesis     Post-Op Diagnosis: Same as pre-operative diagnosis    VIR Providers    Operator: Raquel James    Time out: Prior to the procedure, a time out was performed with all team members present. During the time out, the patient, procedure and procedure site when applicable were verbally verified by the team members and Raquel James and Ivar Bury.    Description of procedure: Successful angiogram with active extravasation from a branch of the left inferior epigastric artery. Embolization with hydrogel embolic. Left groin closure with angioseal.     Sedation:Moderate sedation    Estimated Blood Loss: approximately <10 mL  Complications: None    See detailed procedure note with images in PACS ([email or web address]).    The patient tolerated the procedure well without incident or complication and left the room in stable condition.    Raquel James  03/29/2022 11:52 AM

## 2022-03-29 NOTE — Unmapped (Signed)
MICU Evening Summary     Date of Service: 03/29/2022    Interval History: [name] is a 41 y.o. male with NASH cirrhosis c/b hepatic encephalopathy, T2DM, who presented to ED per recommendation of his liver transplant team for leukocytosis/dyspnea, cough, concern for infection, and worsening liver function. Admit to OBS 5/22. Now s/p LVP 5/23 (6L bloody output, alb 50g given), previous diag para 5/18 in clinic. RRT 5/24 early AM for softer BPs w worsened abd pain central and RLQ w nausea. CTA with subtle arterial bleed at para site lateral to inf epigastric artery with ~1L blood as well as separate mod ascites. Repleted LR and tx to MICU. Critical care services are indicated for:    Principal Problem:    Decompensated hepatic cirrhosis (CMS-HCC)  Active Problems:    Hepatic encephalopathy (CMS-HCC)    Type 2 diabetes mellitus without complication, without long-term current use of insulin (CMS-HCC)    Dyspnea    Hyponatremia  Resolved Problems:    * No resolved hospital problems. *        Assessment & Plan     N: non focal neuro motor, cont baclofen and lido, morphine given for pain w good effect  P: NAI  CV: goal SBP>100, LR 500 given w good response, lactate uptrending, repleted albumin  GI: home diuretics held as outpt d/t metabolic derangement which led to wt gain and inc ascites, cont lactulose, ppi, rifaximin, remain NPO for now, last LVP 5/23 for 6L bloody output, abd distended/tender  R: holding home lasix and spiro, cont home magox and zinc  H: hold chemo ppx for now, coagulopathic, started Vit K x 3d, LVP puncture site c/d/i, CTA per report by rad c/f subtle extrav at para site lateral to inferior epigastric artery. There is hemoperitoneum, with blood tracking inferiorly along the left paracolic gutter and into the pelvis however embo likely difficult d/t caliber of vessels, VIR c/s no intervention for now will consider embo if worsening clinical status, trend hgb q6  E: accu check q6, hold home metformin  ID: start CTX 5/24     Critical Care Attestation     This patient is critically ill or injured with the impairment of vital organ systems such that there is a high probability of imminent or life threatening deterioration in the patient's condition. This patient must remain in the ICU for ongoing evaluation of the comprehensive management plan outlined in this note. I directly provided critical care services as documented in this note and the critical care time spent (40 min) is exclusive of separately billable procedures.  In addition to time spent for critical care management, I also provided advance care planning services for  0  minutes (see GOC above or ACP note for details)???. Total billable critical care time 40 minutes.    Emy Angevine Fonnie Mu, ACNP

## 2022-03-29 NOTE — Unmapped (Addendum)
Pt being transferred to MICU for closer monitoring. Report Called to [place], Charity fundraiser. Pt going to [id number]. Jason RR RN to transport.

## 2022-03-29 NOTE — Unmapped (Signed)
Error

## 2022-03-29 NOTE — Unmapped (Signed)
ADULT SPECIALTY CARE TEAM  Transport Summary Note     Departing Unit: MICU Departure Time: 0930   Unit Returned To: MICU Return Time: 1215           Report received from primary nurse via [facility]. Patient prepared to transport to Interventional Radiology via stretcher under ICU Transport Protocol. Vital signs during transport, see vital signs section of DocFlowsheet for further details. Patient is  RASS -1 . O2 via  [place]  @  2 Lpm. Patient tolerated procedure well. Droplet/contact/falls/aspiration precautions maintained throughout transport.     Returned to MICU, update and care given to primary nurse. See Doc Flowsheet for additional transport documentation.     Per MD:  Pt to lie flat for 2hrs.  Primary RN informed to monitor L groin for bleeding/hematoma as well.

## 2022-03-29 NOTE — Unmapped (Signed)
Central Venous Catheter Insertion Procedure Note     Date of Service: 03/29/2022    Patient Name: Derek Boyer  Patient MRN: [id number]    Line type:  Triple Lumen    Indications:  Inadequate peripheral access, Medications requiring central access, Hypovolemia, and Severe bleeding    Consent:  I explained the potential benefits and risks of the procedure, including  catheter malposition, pneumothorax, cardiac complications, thrombosis, hematoma formation, arterial cannulation, air embolism local/catheter-related infection, nerve/tissue damage and sepsis. I explained potential alternatives. The patient/HCDM understands these risks, agrees to the procedure, and signed the informed consent form.    Procedure Details:   Time-out was performed immediately prior to the procedure.    The left internal jugular vein was identified using bedside ultrasound. This area was prepped and draped in the usual sterile fashion. Maximum sterile technique was used including antiseptics, cap, gloves, gown, hand hygiene, mask, and sterile sheet.  The patient was placed in Trendelenburg position. Local anesthesia with 1% lidocaine was applied subcutaneously then deep to the skin. The finder was then inserted into the internal jugular vein using ultrasound guidance.    Using the Seldinger Technique a Triple Lumen was placed with each port easily flushed and freely drawing venous blood.    The catheter was secured with sutures. A sterile CHG drsg was applied to the site.    Condition:  The patient tolerated the procedure well and remains in the same condition as pre-procedure.    Complications:  None; patient tolerated the procedure well.    Plan:  CXR confirmed appropriate placement of central line. CVAD order placed OK to use     Lavaughn Bisig L Digby Groeneveld, AGNP

## 2022-03-29 NOTE — Unmapped (Cosign Needed)
Nephrology Consult Note    Requesting Attending Physician :  Richrd Prime Fischer, *  Service Requesting Consult : Medical ICU (MDI)  Reason for Consult: AKI    Assessment: Derek Boyer is a 41 y.o. male who presented to [facility] with decompensation of severe liver disease with course c/b paracentesis-related hemoperitoneum. Nephrology has been consulted for AKI.    # AKI, Severe: Suspected HRS physiology at baseline due to decompensated cirrhosis + ascites,with worsening perfusion in context of hemorrhagic shock. Complicated by severe metabolic acidosis (in part a shock + liver disease-associated lactic acidosis) and hyperkalemia.  - UOP not significantly impaired, however given this acute severe insult with metabolic derangements and tenuous clinical status at baseline while awaiting transplant, will initiate CRRT (pulling little fluid as to not worsen renal perfusion) for better metabolic control. Ideally will be a short time.    # Decompensated liver disease  - Listed for transplant   - Evaluation and management per primary team  - No changes to management from a nephrology standpoint at this time    RECOMMENDATIONS:   - Will initiate CRRT  - We will continue to follow.     Discussed recommendations with primary team.    Elmer Picker, MD  03/29/2022 3:17 PM  _____________________________________________________________________________________    History of Present Illness: Derek Boyer is 41 y.o. male with decompensated NASH cirrhosis (ascites, HE) who is seen in consultation at the request of Luz Brazen, * and Medical ICU (MDI). Nephrology has been consulted for AKI, metabolic derangments. History obtained from chart review of internal medical records, the patient, and chart review of outside hospital / outside ambulatory records. Pt presented from hepatology clinic for dyspnea, productive cough, and worsening labs. He gets periodic LVPs, last 5/18 and per chart review has had weight gain and worsening ascites since that time. Workup notable for Bili > 40, sCr 1.13 (baseline 0.8-0.9), hyponatremia to 125. Since admission his sCr had worsened and he's become more acidemic, with hyperkalemia this AM. He decompensated this AM with hypotension and abd pain with CTA notable for extrav from an epigastric vessel, now s/p VIR embo. Most recently labs note Lactate 10.4, K 5.4, ABG 7.23 with bicarb 8.4. Pt in discomfort at time of my evaluation but able to make jokes. We discussed dialysis and dialysis in the hospital as well as CRRT and pt verbalized understanding of his need for the procedure.     INPATIENT MEDICATIONS:    Current Facility-Administered Medications:     baclofen (LIORESAL) tablet 30 mg, Oral, Nightly    [START ON 03/30/2022] cefTRIAXone (ROCEPHIN) 1,000 mg in sodium chloride 0.9 % (NS) 100 mL IVPB-connector bag, Intravenous, Q24H    dextrose (D10W) 10% bolus 125 mL, Intravenous, Q10 Min PRN    fentaNYL (PF) (SUBLIMAZE) injection 50 mcg, Intravenous, Q30 Min PRN    glucagon injection 1 mg, Intramuscular, Once PRN    glucose chewable tablet 16 g, Oral, Q10 Min PRN    lactulose oral solution, Oral, BID    lidocaine (LIDODERM) 5 % patch 1 patch, Transdermal, Q24H    norepinephrine 8 mg in dextrose 5 % 250 mL (32 mcg/mL) infusion PMB, Intravenous, Continuous    ondansetron (ZOFRAN-ODT) disintegrating tablet 4 mg, Oral, Q6H PRN **OR** ondansetron (ZOFRAN) injection 4 mg, Intravenous, Q6H PRN    pantoprazole (PROTONIX) EC tablet 20 mg, Oral, Daily    phenylephrine 0.8 mg/10 mL (80 mcg/mL) injection, ,     phytonadione (vitamin K1) (AQUA-MEPHYTON) 10 mg in sodium  chloride (NS) 0.9 % 50 mL IVPB, Intravenous, Daily    rifAXIMin (XIFAXAN) tablet 550 mg, Oral, BID    simethicone (MYLICON) chewable tablet 80 mg, Oral, Q6H PRN    sodium bicarbonate 150 mEq in dextrose 5 % 1,150 mL continuous infusion, Intravenous, Continuous    sodium chloride (NS) 0.9 % infusion, Intravenous, Continuous    sodium chloride (NS) 0.9 % infusion, Intravenous, Continuous    sodium chloride (NS) 0.9 % infusion, Intravenous, Continuous    sodium zirconium cyclosilicate (LOKELMA) packet 10 g, Oral, Daily    vasopressin 20 units in 100 mL (0.2 units/mL) infusion premade vial, Intravenous, Continuous    zinc sulfate (ZINCATE) capsule 220 mg, Oral, Daily    OUTPATIENT MEDICATIONS:  Prior to Admission medications    Medication Dose, Route, Frequency   baclofen (LIORESAL) 10 MG tablet 30 mg, Oral, Daily   furosemide (LASIX) 20 MG tablet TAKE 1 Tablet BY MOUTH ONCE DAILY (take 1 with furosemide 40mg  tablet TO equal 60mg  total daily)   lactulose 10 gram/15 mL solution 30 g, Oral, 3 times a day (standard), Titrate for 4-5 bowel movements per day   magnesium oxide (MAG-OX) 400 mg (241.3 mg elemental magnesium) tablet TAKE 1 Tablet BY MOUTH ONCE DAILY   metFORMIN (GLUCOPHAGE-XR) 500 MG 24 hr tablet 500 mg, Oral, Daily   multivitamin/iron/folic acid (CENTRUM ORAL) No dose, route, or frequency recorded.   omeprazole (PRILOSEC) 20 MG capsule TAKE ONE CAPSULE BY MOUTH EVERY MORNING BEFORE BREAKFAST   ondansetron (ZOFRAN-ODT) 4 MG disintegrating tablet 4 mg, Oral, Every 8 hours PRN   ONE-A-DAY MEN'S COMPLETE 240 mcg-30 mcg- 300 mcg Tab take 1 Tablet by mouth at bedtime   rifAXIMin (XIFAXAN) 550 mg Tab 550 mg, Oral, 2 times a day (standard)   spironolactone (ALDACTONE) 100 MG tablet take 1 Tablet by mouth in the morning (take with 50mg  tablet TO equal 150mg  total)   zinc gluconate 50 mg (7 mg elemental zinc) tablet 50 mg, Oral, Daily (standard), Taking twice a day        ALLERGIES:  Other    MEDICAL HISTORY:  Past Medical History:   Diagnosis Date    Diabetes mellitus (CMS-HCC)      Past Surgical History:   Procedure Laterality Date    PR UPPER GI ENDOSCOPY,DIAGNOSIS N/A 09/10/2020    Procedure: UGI ENDO, INCLUDE ESOPHAGUS, STOMACH, & DUODENUM &/OR JEJUNUM; DX W/WO COLLECTION SPECIMN, BY BRUSH OR WASH;  Surgeon: Janyth Pupa, MD;  [facility]: [facility];  Service: Gastroenterology     SOCIAL HISTORY  Social History     Social History Narrative    Not on file      reports that he has never smoked. He has never used smokeless tobacco. He reports that he does not currently use alcohol. He reports that he does not use drugs.   FAMILY HISTORY  History reviewed. No pertinent family history.     Physical Exam:   Vitals:    03/29/22 1205 03/29/22 1210 03/29/22 1215 03/29/22 1400   BP:       Pulse: 92 94 90 94   Resp: 24 26 22 26    Temp: 36.4 ??C (97.5 ??F) 36 ??C (96.8 ??F) 36.4 ??C (97.5 ??F) 36.3 ??C (97.3 ??F)   TempSrc:       SpO2: 98% 97% 99% 98%     I/O this shift:  In: 2791.4 [I.V.:266.7; Blood:685; IV Piggyback:1839.7]  Out: -     Intake/Output Summary (Last 24 hours)  at 03/29/2022 1517  Last data filed at 03/29/2022 1400  Gross per 24 hour   Intake 2891.41 ml   Output --   Net 2891.41 ml       General: jaundiced, ill appearing  Heart: warm extremities, borderline tachy  Lungs: incr RR  Abd: distended, tender  Ext: 1+ edema

## 2022-03-29 NOTE — Unmapped (Signed)
Updated [name] MELD score in [place] today with the following labs:  Recertification of  MELD is 36 via UNOS    Mr. [name] was also noted to have the following Mild Encephalopathy and Moderate Ascites and this was noted in his MELD upgrade today.  Lab Results   Component Value Date    CREATININE 1.51 (H) 03/29/2022    NA 126 (L) 03/29/2022    BILITOT 33.0 (H) 03/29/2022    ALBUMIN 2.6 (L) 03/29/2022    INR 2.41 03/29/2022

## 2022-03-29 NOTE — Unmapped (Signed)
Critical Response Nurse Consult Note    Critical Response Nurse consult was ordered for Multiple Interventions    On Unit  [facility] 1224    The patient???s primary language is [place].    Interpreter services were N/A    The consult was Not triggered by DI      The patient???s DI score at time of consult was 31-40  DI: 32.05    Interventions included:  Consult converted to Rapid Response    The time frame for follow-up reassessment See significant event note    Primary nurse was educated to submit page at the above time frame established, but if patient exhibits any acute deviations from baseline and or have signs and symptoms of patient deterioration, the recommendation is to activate the rapid response team.    Thank you for your consult,    Carloyn Jaeger RN

## 2022-03-29 NOTE — Unmapped (Signed)
Pt axox4. IV albumin admin this shift. No complaints of pain. Family member remains at bedside. Call bell within reach.     Problem: Adult Inpatient Plan of Care  Goal: Plan of Care Review  Outcome: Progressing  Goal: Patient-Specific Goal (Individualized)  Outcome: Progressing  Goal: Absence of Hospital-Acquired Illness or Injury  Outcome: Progressing  Intervention: Identify and Manage Fall Risk  Recent Flowsheet Documentation  Taken 03/28/2022 0800 by Alphonzo Dublin, RN  Safety Interventions: family at bedside  Goal: Optimal Comfort and Wellbeing  Outcome: Progressing  Goal: Readiness for Transition of Care  Outcome: Progressing  Goal: Rounds/Family Conference  Outcome: Progressing     Problem: Infection  Goal: Absence of Infection Signs and Symptoms  Outcome: Progressing     Problem: Diabetes Comorbidity  Goal: Blood Glucose Level Within Targeted Range  Outcome: Progressing     Problem: Pain Acute  Goal: Acceptable Pain Control and Functional Ability  Outcome: Progressing     Problem: Gastrointestinal Complications (Liver Failure)  Goal: Optimal Gastrointestinal Function  Outcome: Progressing     Problem: Impaired Coagulation (Liver Failure)  Goal: Optimal Coagulation Function  Outcome: Progressing     Problem: Pain (Liver Failure)  Goal: Optimal Pain Control  Outcome: Progressing

## 2022-03-29 NOTE — Unmapped (Signed)
Critical Response Nurse Consult Note    Critical Response Nurse consult was ordered for transfer to micu    On [facility]    The patient???s primary language is [place].    Interpreter services were N/A    The consult was Not triggered by DI      The patient???s DI score at time of consult was 21-30    Interventions included:  transfer to micu as stepdown    The time frame for follow-up reassessment No follow-up is needed at this time    Primary nurse was educated to submit page at the above time frame established, but if patient exhibits any acute deviations from baseline and or have signs and symptoms of patient deterioration, the recommendation is to activate the rapid response team.    Thank you for your consult,    Mervin Hack RN

## 2022-03-29 NOTE — Unmapped (Signed)
MICU Daily Progress Note     Date of Service: 03/29/2022    Problem List:   Principal Problem:    Decompensated hepatic cirrhosis (CMS-HCC)  Active Problems:    Hepatic encephalopathy (CMS-HCC)    Obesity (BMI 30-39.9)    Type 2 diabetes mellitus without complication, without long-term current use of insulin (CMS-HCC)    Dyspnea    Hyponatremia    Abdominal hemorrhage    AKI (acute kidney injury) (CMS-HCC)  Resolved Problems:    * No resolved hospital problems. *      Interval history: [name] is a 41 y.o. 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 Admitted to observation on 5/22 for decompensated cirrhosis . Now s/p diagnostic paracentesis on 5/18 in clinic and LVP 5/23 (6L bloody output, alb 50g given). RRT 5/24 early AM for softer BPs with worsened abdominal pain in umbilical region and RLQ with nausea. CTA abdomen demonstrated with subtle arterial bleed at para site lateral to inferior epigastric artery with ~1L blood as well as separate moderate ascites. Transferred to MICU for further management and decompensated requiring pressors and blood products.       Neurological   Pain:  - fentanyl q30 minutes for worsening abdominal pain in the setting of hemoperitoneum   - chronic back pain/spams: baclofen nightly and lidocaine patch    Pulmonary   Tachypnea: compensating for metabolic acidosis  - RA    Cardiovascular   Hemorrhagic Shock: likely complication for paracentesis likely on 5/23 as opposed to 5/18. CTA AP demonstrated    - bolus 2 L LR on arrival to the MICU  - continue levo and vaso for MAP goal > 65    Renal   Metabolic Acidosis  Hyperkalemia  AKI: Baseline creatinine of 0.8 - 0.9 on admission was 1.13, with worsening metabolic acidosis, hyperkalemia, and creatinine of 1.99 on arrival to MICU.  - nephrology following  - shifted x2 for hyperkalemia   - Lokelma to be stopped once CRRT starts  - started on CRRT on 5/24  - bicarb push and started on a gtt plan to stop after 6 hours of CRRT or until pH > 7.30  - ABGs q2h    Infectious Disease/Autoimmune   Concern for Infection: worsening pressors requirement and leukocytosis 20.7 on transfer to MICU. Empirically started on BSA for concern of infection from an abdominal source in the setting of cirrhosis with increased ascites and worsening pressor requirement.  - flagyl, cefepime, and vanc started   - f/u cultures    Rhinovirus/Enterovirus: Patient reports headache, cough, malaise and with leukocytosis of 12.3 on admission. RVP positive for Rhinovirus/enterovirus.  - CTM      Cultures:  Blood Culture, Routine (no units)   Date Value   03/09/2022 No Growth at 24 hours     Urine Culture, Comprehensive (no units)   Date Value   03/09/2022 NO GROWTH     WBC (10*9/L)   Date Value   03/29/2022 35.0 (H)     WBC, UA (/HPF)   Date Value   03/29/2022 <1        FEN/GI   Hemoperitoneum   Decompensated NASH Cirrhosis:  diag para 5/18 in clinic and LVP 5/23.  - lactulose BID for 3 -5 bowel movements a day  - rifaximin  - Protonix   - holding home  lasix  - held ceftriaxone for SBP ppx   - see blood transfusion plan below  - continue home zinc    Malnutrition Assessment: Not done yet.  There is no height or weight on file to calculate BMI.        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 There is hemoperitoneum, with blood tracking inferiorly along the left paracolic gutter and into the pelvis however embo likely difficult d/t caliber of vessels. Now s/p VIR embolized branch of the left inferior epigastric artery.  - vit K x 3 doses for elevated INR  - transfused 2 PRBC, 2FFP, and 1U Cryo   - s/p DDAVP during VIR procedure on 5/24  - holding DVT ppx  - monitor left groin site  - q8h DIC panel, q4h CBC  - fibrinogen goal > 150, hgb > 7, Platelets > 50      Endocrine   DM:   - holding home metformin    Integumentary     - WOCN consulted for high risk skin assessment No. Reason: mobile.  - cont pressure mitigating precautions per skin policy    Prophylaxis/LDA/Restraints/Consults   Can CVC be removed? No: need for medications requiring central access (e.g. pressors), dialysis catheter   Can A-line be removed? No: >moderate dose pressors  Can Foley be removed? No: Need continuous I/O  Mobility plan: Step 2 - Head of bed elevation (>60 degrees)    Bowel regimen yes  Indwelling catheters & duration discussed  yes  De-escalation of catheters & antimicrobials discussed  yes    Feeding: Oral diet  Analgesia: Pain not adequately controlled, titrating medications  Sedation SAT/SBT: N/A  Thromboembolic ppx: Mechanical only, chemical contraindicated secondary to active bleeding in last 48 hours  Head of bed >30 degrees: Yes  Ulcer ppx: Yes, coagulopathy  Glucose within target range: Yes, in range    Does patient need/have an active type/screen? Yes    RASS at goal? N/A, not on sedation  [place] Agitation Assessment Scale (RASS) : 0 (03/29/2022  4:00 PM)     Can antipsychotics be stopped? N/A, not on antipsychotics       Would hospice care be appropriate for this patient? No, patient requiring support not compatible with hospice  Any unaddressed hospice/palliative care needs? no    Patient Lines/Drains/Airways Status     Active Active Lines, Drains, & Airways     Name Placement date Placement time Site Days    CVC Triple Lumen 03/29/22 Non-tunneled Left Internal jugular 03/29/22  0813  Internal jugular  less than 1    Hemodialysis Catheter With Distal Infusion Port 03/29/22 Right Internal jugular 1.2 mL 1.2 mL 03/29/22  1508  Internal jugular  less than 1    Urethral Catheter 03/29/22  0840  --  less than 1    Peripheral IV 03/29/22 Right Antecubital 03/29/22  0145  Antecubital  less than 1    Peripheral IV 03/29/22 Anterior;Left Forearm 03/29/22  0729  Forearm  less than 1    Arterial Line 03/29/22 Right Radial 03/29/22  0733  Radial  less than 1              Patient Lines/Drains/Airways Status     Active Wounds     None  Goals of Care     Code Status: Full Code    Public relations account executive Maker:  Mr. Harriett Rush current decisional capacity for healthcare decision-making is Full capacity. His designated Educational psychologist) is/are   HCDM (patient stated preference): Markus Daft - Spouse - [phone].      Subjective   Reports worsening abdominal pain     Objective     Vitals - past 24 hours  Temp:  [36 ??C (96.8 ??F)-37 ??C (98.6 ??F)] 36.3 ??C (97.3 ??F)  Heart Rate:  [65-103] 97  SpO2 Pulse:  [71-97] 97  Resp:  [13-29] 18  BP: (90-140)/(24-63) 140/48  SpO2:  [96 %-100 %] 99 % Intake/Output  I/O last 3 completed shifts:  In: 100 [IV Piggyback:100]  Out: -      Physical Exam:    General: ill appearing, mild distress, laying in pain in discomfort  HEENT: moist mucus membrane  CV: RRR, no BLE edema  Pulm: tachypnenic, non labored, clear breath sounds  GI: distented, soft, tender to palpation, umbilical hernia   MSK: MAE  Skin:  Juandice   Neuro: alert following commands, solmulent      Continuous Infusions:   ??? norepinephrine bitartrate-NS 18 mcg/min (03/29/22 1331)   ??? NxStage RFP 400 (+/- BB) 5000 mL - contains 2 mEq/L of potassium     ??? NxStage/multiBic RFP 401 (+/- BB) 5000 mL - contains 4 mEq/L of potassium     ??? sodium bicarbonate in 1000 mL continuous infusion 100 mL/hr at 03/29/22 1356   ??? sodium chloride     ??? sodium chloride     ??? sodium chloride     ??? vasopressin 0.03 Units/min (03/29/22 0824)       Scheduled Medications:   ??? baclofen  30 mg Oral Nightly   ??? Cefepime  2 g Intravenous Q12H   ??? lactulose  30 g Oral BID   ??? lidocaine  1 patch Transdermal Q24H ??? metroNIDAZOLE  500 mg Intravenous Q8H   ??? pantoprazole  20 mg Oral Daily   ??? phenylephrine       ??? phytonadione (AQUA-MEPHYTON) intravenous  10 mg Intravenous Daily   ??? rifAXIMin  550 mg Oral BID   ??? sodium zirconium cyclosilicate  10 g Oral Daily   ??? vancomycin  1,000 mg Intravenous Q24H   ??? zinc sulfate  220 mg Oral Daily       PRN medications:  dextrose in water, fentaNYL (PF), glucagon, glucose, heparin (porcine), heparin (porcine), heparin (porcine), heparin (porcine), ondansetron **OR** ondansetron, simethicone    Data/Imaging Review: Reviewed in Epic and personally interpreted on 03/29/2022. See EMR for detailed results.       Critical Care Attestation     This patient is critically ill or injured with the impairment of vital organ systems such that there is a high probability of imminent or life threatening deterioration in the patient's condition. This patient must remain in the ICU for ongoing evaluation of the comprehensive management plan outlined in this note. I directly provided critical care services as documented in this note and the critical care time spent (90 min) is exclusive of separately billable procedures.  In addition to time spent for critical care management, I also provided advance care planning services for 0 minutes (see GOC above or ACP note for details)???. Total billable critical care time 90 minutes.    Toma Aran, PA

## 2022-03-29 NOTE — Unmapped (Addendum)
Vancomycin Therapeutic Monitoring Pharmacy Note    [name] is a 41 y.o. male starting vancomycin. Date of therapy initiation: 03/29/22    Indication: Bacteremia/Sepsis    Prior Dosing Information: None/new initiation     Goals:  Therapeutic Drug Levels  Vancomycin trough goal: 10-20 mg/L    Additional Clinical Monitoring/Outcomes  Renal function, volume status (intake and output)    Results: Not applicable    Wt Readings from Last 1 Encounters:   03/10/22 87.2 kg (192 lb 3.9 oz)     Creatinine   Date Value Ref Range Status   03/29/2022 2.02 (H) 0.60 - 1.10 mg/dL Final   16/08/9603 5.40 (H) 0.60 - 1.10 mg/dL Final   98/09/9146 8.29 (H) 0.60 - 1.10 mg/dL Final        Pharmacokinetic Considerations and Significant Drug Interactions:  Adult (estimated initial): Vd = 61.2 L, ke = 0.04 hr-1  Concurrent nephrotoxic meds: not applicable    Assessment/Plan:  Recommendation(s)  Start vancomycin 1,000 mg Q24H  given patient is to be started on CRRT  Estimated trough on recommended regimen: 10-20    Follow-up  Level due:  Prior to the third or fourth dose  A pharmacist will continue to monitor and order levels as appropriate    Please page service pharmacist with questions/clarifications.    Ulyses Southward, PharmD

## 2022-03-30 LAB — BLOOD GAS CRITICAL CARE PANEL, ARTERIAL
BASE EXCESS ARTERIAL: -8.9 — ABNORMAL LOW (ref -2.0–2.0)
BASE EXCESS ARTERIAL: -6.8 — ABNORMAL LOW (ref -2.0–2.0)
BASE EXCESS ARTERIAL: -5.4 — ABNORMAL LOW (ref -2.0–2.0)
BASE EXCESS ARTERIAL: -6.6 — ABNORMAL LOW (ref -2.0–2.0)
BASE EXCESS ARTERIAL: -11 — ABNORMAL LOW (ref -2.0–2.0)
BASE EXCESS ARTERIAL: -9.9 — ABNORMAL LOW (ref -2.0–2.0)
BASE EXCESS ARTERIAL: -7.4 — ABNORMAL LOW (ref -2.0–2.0)
CALCIUM IONIZED ARTERIAL (MG/DL): 4.3 mg/dL — ABNORMAL LOW (ref 4.40–5.40)
CALCIUM IONIZED ARTERIAL (MG/DL): 4.31 mg/dL — ABNORMAL LOW (ref 4.40–5.40)
CALCIUM IONIZED ARTERIAL (MG/DL): 4.53 mg/dL (ref 4.40–5.40)
CALCIUM IONIZED ARTERIAL (MG/DL): 4.1 mg/dL — ABNORMAL LOW (ref 4.40–5.40)
CALCIUM IONIZED ARTERIAL (MG/DL): 3.98 mg/dL — ABNORMAL LOW (ref 4.40–5.40)
CALCIUM IONIZED ARTERIAL (MG/DL): 4.32 mg/dL — ABNORMAL LOW (ref 4.40–5.40)
CALCIUM IONIZED ARTERIAL (MG/DL): 3.89 mg/dL — ABNORMAL LOW (ref 4.40–5.40)
GLUCOSE WHOLE BLOOD: 175 mg/dL (ref 70–179)
GLUCOSE WHOLE BLOOD: 119 mg/dL (ref 70–179)
GLUCOSE WHOLE BLOOD: 81 mg/dL (ref 70–179)
GLUCOSE WHOLE BLOOD: 85 mg/dL (ref 70–179)
GLUCOSE WHOLE BLOOD: 121 mg/dL (ref 70–179)
GLUCOSE WHOLE BLOOD: 152 mg/dL (ref 70–179)
GLUCOSE WHOLE BLOOD: 163 mg/dL (ref 70–179)
HCO3 ARTERIAL: 16 mmol/L — ABNORMAL LOW (ref 22–27)
HCO3 ARTERIAL: 17 mmol/L — ABNORMAL LOW (ref 22–27)
HCO3 ARTERIAL: 19 mmol/L — ABNORMAL LOW (ref 22–27)
HCO3 ARTERIAL: 17 mmol/L — ABNORMAL LOW (ref 22–27)
HCO3 ARTERIAL: 15 mmol/L — ABNORMAL LOW (ref 22–27)
HCO3 ARTERIAL: 15 mmol/L — ABNORMAL LOW (ref 22–27)
HCO3 ARTERIAL: 18 mmol/L — ABNORMAL LOW (ref 22–27)
HEMOGLOBIN BLOOD GAS: 7.9 g/dL — ABNORMAL LOW
HEMOGLOBIN BLOOD GAS: 7.9 g/dL — ABNORMAL LOW
HEMOGLOBIN BLOOD GAS: 7.6 g/dL — ABNORMAL LOW
HEMOGLOBIN BLOOD GAS: 7.2 g/dL — ABNORMAL LOW
HEMOGLOBIN BLOOD GAS: 8.3 g/dL — ABNORMAL LOW (ref 13.50–17.50)
HEMOGLOBIN BLOOD GAS: 8.1 g/dL — ABNORMAL LOW
HEMOGLOBIN BLOOD GAS: 7.3 g/dL — ABNORMAL LOW (ref 13.50–17.50)
LACTATE BLOOD ARTERIAL: 8.4 mmol/L (ref <1.3)
LACTATE BLOOD ARTERIAL: 6.1 mmol/L (ref <1.3)
LACTATE BLOOD ARTERIAL: 6.5 mmol/L (ref <1.3)
LACTATE BLOOD ARTERIAL: 7.7 mmol/L (ref <1.3)
LACTATE BLOOD ARTERIAL: 13 mmol/L (ref <1.3)
LACTATE BLOOD ARTERIAL: 14.2 mmol/L (ref <1.3)
LACTATE BLOOD ARTERIAL: 11.8 mmol/L (ref <1.3)
O2 SATURATION ARTERIAL: 96.5 % (ref 94.0–100.0)
O2 SATURATION ARTERIAL: 97.2 % (ref 94.0–100.0)
O2 SATURATION ARTERIAL: 98.3 % (ref 94.0–100.0)
O2 SATURATION ARTERIAL: 98.2 % (ref 94.0–100.0)
O2 SATURATION ARTERIAL: 94.4 % (ref 94.0–100.0)
O2 SATURATION ARTERIAL: 95.6 % (ref 94.0–100.0)
O2 SATURATION ARTERIAL: 96.6 % (ref 94.0–100.0)
PCO2 ARTERIAL: 29.4 mmHg — ABNORMAL LOW (ref 35.0–45.0)
PCO2 ARTERIAL: 29.8 mmHg — ABNORMAL LOW (ref 35.0–45.0)
PCO2 ARTERIAL: 31.3 mmHg — ABNORMAL LOW (ref 35.0–45.0)
PCO2 ARTERIAL: 28 mmHg — ABNORMAL LOW (ref 35.0–45.0)
PCO2 ARTERIAL: 31.1 mmHg — ABNORMAL LOW (ref 35.0–45.0)
PCO2 ARTERIAL: 31 mmHg — ABNORMAL LOW (ref 35.0–45.0)
PCO2 ARTERIAL: 32.1 mmHg — ABNORMAL LOW (ref 35.0–45.0)
PH ARTERIAL: 7.35 (ref 7.35–7.45)
PH ARTERIAL: 7.38 (ref 7.35–7.45)
PH ARTERIAL: 7.39 (ref 7.35–7.45)
PH ARTERIAL: 7.41 (ref 7.35–7.45)
PH ARTERIAL: 7.28 — ABNORMAL LOW (ref 7.35–7.45)
PH ARTERIAL: 7.31 — ABNORMAL LOW (ref 7.35–7.45)
PH ARTERIAL: 7.35 (ref 7.35–7.45)
PO2 ARTERIAL: 84.6 mmHg (ref 80.0–110.0)
PO2 ARTERIAL: 87.3 mmHg (ref 80.0–110.0)
PO2 ARTERIAL: 105 mmHg (ref 80.0–110.0)
PO2 ARTERIAL: 91.6 mmHg (ref 80.0–110.0)
PO2 ARTERIAL: 78.5 mmHg — ABNORMAL LOW (ref 80.0–110.0)
PO2 ARTERIAL: 89.8 mmHg (ref 80.0–110.0)
PO2 ARTERIAL: 86 mmHg (ref 80.0–110.0)
POTASSIUM WHOLE BLOOD: 4.7 mmol/L — ABNORMAL HIGH (ref 3.4–4.6)
POTASSIUM WHOLE BLOOD: 4.7 mmol/L — ABNORMAL HIGH (ref 3.4–4.6)
POTASSIUM WHOLE BLOOD: 5.7 mmol/L — ABNORMAL HIGH (ref 3.4–4.6)
POTASSIUM WHOLE BLOOD: 5.3 mmol/L — ABNORMAL HIGH (ref 3.4–4.6)
POTASSIUM WHOLE BLOOD: 4.7 mmol/L — ABNORMAL HIGH (ref 3.4–4.6)
POTASSIUM WHOLE BLOOD: 5.3 mmol/L — ABNORMAL HIGH (ref 3.4–4.6)
POTASSIUM WHOLE BLOOD: 5 mmol/L — ABNORMAL HIGH (ref 3.4–4.6)
SODIUM WHOLE BLOOD: 124 mmol/L — ABNORMAL LOW (ref 135–145)
SODIUM WHOLE BLOOD: 127 mmol/L — ABNORMAL LOW (ref 135–145)
SODIUM WHOLE BLOOD: 128 mmol/L — ABNORMAL LOW (ref 135–145)
SODIUM WHOLE BLOOD: 122 mmol/L — ABNORMAL LOW (ref 135–145)
SODIUM WHOLE BLOOD: 133 mmol/L — ABNORMAL LOW (ref 135–145)
SODIUM WHOLE BLOOD: 133 mmol/L — ABNORMAL LOW (ref 135–145)
SODIUM WHOLE BLOOD: 131 mmol/L — ABNORMAL LOW (ref 135–145)

## 2022-03-30 LAB — CBC W/ AUTO DIFF
BASOPHILS ABSOLUTE COUNT: 0 10*9/L (ref 0.0–0.1)
BASOPHILS ABSOLUTE COUNT: 0 10*9/L (ref 0.0–0.1)
BASOPHILS RELATIVE PERCENT: 0.2 %
BASOPHILS RELATIVE PERCENT: 0.2 %
EOSINOPHILS ABSOLUTE COUNT: 0 10*9/L (ref 0.0–0.5)
EOSINOPHILS ABSOLUTE COUNT: 0 10*9/L (ref 0.0–0.5)
EOSINOPHILS RELATIVE PERCENT: 0.1 %
EOSINOPHILS RELATIVE PERCENT: 0.2 %
HEMATOCRIT: 22 % — ABNORMAL LOW (ref 39.0–48.0)
HEMATOCRIT: 21.6 % — ABNORMAL LOW (ref 39.0–48.0)
HEMOGLOBIN: 8 g/dL — ABNORMAL LOW (ref 12.9–16.5)
HEMOGLOBIN: 7.7 g/dL — ABNORMAL LOW (ref 12.9–16.5)
LYMPHOCYTES ABSOLUTE COUNT: 1.1 10*9/L (ref 1.1–3.6)
LYMPHOCYTES ABSOLUTE COUNT: 0.2 10*9/L — ABNORMAL LOW (ref 1.1–3.6)
LYMPHOCYTES RELATIVE PERCENT: 3.5 %
LYMPHOCYTES RELATIVE PERCENT: 1.1 %
MEAN CORPUSCULAR HEMOGLOBIN CONC: 36.2 g/dL — ABNORMAL HIGH (ref 32.0–36.0)
MEAN CORPUSCULAR HEMOGLOBIN CONC: 35.8 g/dL (ref 32.0–36.0)
MEAN CORPUSCULAR HEMOGLOBIN: 34.1 pg — ABNORMAL HIGH (ref 25.9–32.4)
MEAN CORPUSCULAR HEMOGLOBIN: 33.9 pg — ABNORMAL HIGH (ref 25.9–32.4)
MEAN CORPUSCULAR VOLUME: 94.2 fL (ref 77.6–95.7)
MEAN CORPUSCULAR VOLUME: 94.9 fL (ref 77.6–95.7)
MEAN PLATELET VOLUME: 10 fL (ref 6.8–10.7)
MONOCYTES ABSOLUTE COUNT: 3.5 10*9/L — ABNORMAL HIGH (ref 0.3–0.8)
MONOCYTES ABSOLUTE COUNT: 2.3 10*9/L — ABNORMAL HIGH (ref 0.3–0.8)
MONOCYTES RELATIVE PERCENT: 10.8 %
MONOCYTES RELATIVE PERCENT: 11.2 %
NEUTROPHILS ABSOLUTE COUNT: 27.8 10*9/L — ABNORMAL HIGH (ref 1.8–7.8)
NEUTROPHILS ABSOLUTE COUNT: 18.3 10*9/L — ABNORMAL HIGH (ref 1.8–7.8)
NEUTROPHILS RELATIVE PERCENT: 85.4 %
NEUTROPHILS RELATIVE PERCENT: 87.3 %
PLATELET COUNT: >148 10*9/L — ABNORMAL LOW (ref 150–450)
PLATELET COUNT: 124 10*9/L — ABNORMAL LOW (ref 150–450)
RED BLOOD CELL COUNT: 2.34 10*12/L — ABNORMAL LOW (ref 4.26–5.60)
RED BLOOD CELL COUNT: 2.27 10*12/L — ABNORMAL LOW (ref 4.26–5.60)
RED CELL DISTRIBUTION WIDTH: 21.5 % — ABNORMAL HIGH (ref 12.2–15.2)
RED CELL DISTRIBUTION WIDTH: 20.9 % — ABNORMAL HIGH (ref 12.2–15.2)
WBC ADJUSTED: 32.6 10*9/L — ABNORMAL HIGH (ref 3.6–11.2)
WBC ADJUSTED: 21 10*9/L — ABNORMAL HIGH (ref 3.6–11.2)

## 2022-03-30 LAB — FIBRINOGEN
FIBRINOGEN LEVEL: 154 mg/dL — ABNORMAL LOW (ref 175–500)
FIBRINOGEN LEVEL: 145 mg/dL — ABNORMAL LOW (ref 175–500)
FIBRINOGEN LEVEL: 133 mg/dL — ABNORMAL LOW (ref 175–500)

## 2022-03-30 LAB — BASIC METABOLIC PANEL
CALCIUM: 8.5 mg/dL — ABNORMAL LOW (ref 8.7–10.4)
CALCIUM: 8.9 mg/dL (ref 8.7–10.4)
CALCIUM: 8.7 mg/dL (ref 8.7–10.4)
CALCIUM: 9 mg/dL (ref 8.7–10.4)
CALCIUM: 8.6 mg/dL — ABNORMAL LOW (ref 8.7–10.4)
CHLORIDE: 101 mmol/L (ref 98–107)
CHLORIDE: 98 mmol/L (ref 98–107)
CHLORIDE: 97 mmol/L — ABNORMAL LOW (ref 98–107)
CHLORIDE: 96 mmol/L — ABNORMAL LOW (ref 98–107)
CHLORIDE: 96 mmol/L — ABNORMAL LOW (ref 98–107)
CREATININE: 2.16 mg/dL — ABNORMAL HIGH
CREATININE: 2.18 mg/dL — ABNORMAL HIGH
CREATININE: 2.17 mg/dL — ABNORMAL HIGH
CREATININE: 2.32 mg/dL — ABNORMAL HIGH
CREATININE: 2.21 mg/dL — ABNORMAL HIGH
EGFR CKD-EPI (2021) MALE: 38 mL/min/{1.73_m2} — ABNORMAL LOW (ref >=60)
EGFR CKD-EPI (2021) MALE: 38 mL/min/{1.73_m2} — ABNORMAL LOW (ref >=60)
EGFR CKD-EPI (2021) MALE: 38 mL/min/{1.73_m2} — ABNORMAL LOW (ref >=60)
EGFR CKD-EPI (2021) MALE: 35 mL/min/{1.73_m2} — ABNORMAL LOW (ref >=60)
EGFR CKD-EPI (2021) MALE: 37 mL/min/{1.73_m2} — ABNORMAL LOW (ref >=60)
POTASSIUM: 5.2 mmol/L — ABNORMAL HIGH (ref 3.4–4.8)
POTASSIUM: 5.7 mmol/L — ABNORMAL HIGH (ref 3.4–4.8)
POTASSIUM: 5.6 mmol/L — ABNORMAL HIGH (ref 3.4–4.8)
POTASSIUM: 5.5 mmol/L — ABNORMAL HIGH (ref 3.5–5.1)
POTASSIUM: 5.2 mmol/L — ABNORMAL HIGH (ref 3.4–4.8)
SODIUM: 130 mmol/L — ABNORMAL LOW (ref 135–145)
SODIUM: 129 mmol/L — ABNORMAL LOW (ref 135–145)
SODIUM: 131 mmol/L — ABNORMAL LOW (ref 135–145)
SODIUM: 130 mmol/L — ABNORMAL LOW (ref 135–145)
SODIUM: 131 mmol/L — ABNORMAL LOW (ref 135–145)

## 2022-03-30 LAB — BLOOD GAS CRITICAL CARE PANEL, VENOUS
BASE EXCESS VENOUS: -4.6 — ABNORMAL LOW (ref -2.0–2.0)
CALCIUM IONIZED VENOUS (MG/DL): 4.5 mg/dL (ref 4.40–5.40)
GLUCOSE WHOLE BLOOD: 82 mg/dL (ref 70–179)
HCO3 VENOUS: 20 mmol/L — ABNORMAL LOW (ref 22–27)
HEMOGLOBIN BLOOD GAS: 7.5 g/dL — ABNORMAL LOW
LACTATE BLOOD VENOUS: 6.3 mmol/L (ref 0.5–1.8)
O2 SATURATION VENOUS: 82.2 % (ref 40.0–85.0)
PCO2 VENOUS: 33 mmHg — ABNORMAL LOW (ref 40–60)
PH VENOUS: 7.39 (ref 7.32–7.43)
PO2 VENOUS: 50 mmHg (ref 30–55)
POTASSIUM WHOLE BLOOD: 5.7 mmol/L — ABNORMAL HIGH (ref 3.4–4.6)
SODIUM WHOLE BLOOD: 128 mmol/L — ABNORMAL LOW (ref 135–145)

## 2022-03-30 LAB — HEPATIC FUNCTION PANEL
ALBUMIN: 3.6 g/dL (ref 3.4–5.0)
ALBUMIN: 3.7 g/dL (ref 3.4–5.0)
ALBUMIN: 3.8 g/dL (ref 3.4–5.0)
ALBUMIN: 3.5 g/dL (ref 3.4–5.0)
ALKALINE PHOSPHATASE: 454 U/L — ABNORMAL HIGH (ref 46–116)
ALKALINE PHOSPHATASE: 618 U/L — ABNORMAL HIGH (ref 46–116)
ALKALINE PHOSPHATASE: 715 U/L — ABNORMAL HIGH (ref 46–116)
ALKALINE PHOSPHATASE: 714 U/L — ABNORMAL HIGH (ref 46–116)
ALT (SGPT): 3793 U/L — ABNORMAL HIGH (ref 10–49)
ALT (SGPT): 4422 U/L — ABNORMAL HIGH (ref 10–49)
ALT (SGPT): 4248 U/L — ABNORMAL HIGH (ref 10–49)
ALT (SGPT): 4258 U/L — ABNORMAL HIGH (ref 10–49)
AST (SGOT): 10798 U/L — ABNORMAL HIGH (ref <=34)
AST (SGOT): 11587 U/L — ABNORMAL HIGH (ref <=34)
AST (SGOT): 11163 U/L — ABNORMAL HIGH (ref <=34)
AST (SGOT): 10798 U/L — ABNORMAL HIGH (ref <=34)
BILIRUBIN DIRECT: 22 mg/dL — ABNORMAL HIGH (ref 0.00–0.30)
BILIRUBIN DIRECT: 22.3 mg/dL — ABNORMAL HIGH (ref 0.00–0.30)
BILIRUBIN DIRECT: 23.5 mg/dL — ABNORMAL HIGH (ref 0.00–0.30)
BILIRUBIN DIRECT: 20.3 mg/dL — ABNORMAL HIGH (ref 0.00–0.30)
BILIRUBIN TOTAL: 33.7 mg/dL — ABNORMAL HIGH (ref 0.3–1.2)
BILIRUBIN TOTAL: 34.6 mg/dL — ABNORMAL HIGH (ref 0.3–1.2)
BILIRUBIN TOTAL: 34.4 mg/dL — ABNORMAL HIGH (ref 0.3–1.2)
BILIRUBIN TOTAL: 33 mg/dL — ABNORMAL HIGH (ref 0.3–1.2)
PROTEIN TOTAL: 5.8 g/dL (ref 5.7–8.2)
PROTEIN TOTAL: 6.1 g/dL (ref 5.7–8.2)
PROTEIN TOTAL: 5.6 g/dL — ABNORMAL LOW (ref 5.7–8.2)

## 2022-03-30 LAB — COMPREHENSIVE METABOLIC PANEL
ALBUMIN: 3.8 g/dL (ref 3.4–5.0)
ALKALINE PHOSPHATASE: 449 U/L — ABNORMAL HIGH (ref 46–116)
ALT (SGPT): 3815 U/L — ABNORMAL HIGH (ref 10–49)
AST (SGOT): 10717 U/L — ABNORMAL HIGH (ref <=34)
BILIRUBIN TOTAL: 33.7 mg/dL — ABNORMAL HIGH (ref 0.3–1.2)
CALCIUM: 9 mg/dL (ref 8.7–10.4)
CHLORIDE: 98 mmol/L (ref 98–107)
CREATININE: 2.3 mg/dL — ABNORMAL HIGH
EGFR CKD-EPI (2021) MALE: 35 mL/min/{1.73_m2} — ABNORMAL LOW (ref >=60)
POTASSIUM: 5.1 mmol/L — ABNORMAL HIGH (ref 3.4–4.8)
PROTEIN TOTAL: 5.9 g/dL (ref 5.7–8.2)
SODIUM: 129 mmol/L — ABNORMAL LOW (ref 135–145)

## 2022-03-30 LAB — PROTIME-INR
INR: 4.03
INR: 3.89
INR: 5.27
PROTIME: 48.3 s — ABNORMAL HIGH (ref 9.8–12.8)
PROTIME: 46.5 s — ABNORMAL HIGH (ref 9.8–12.8)
PROTIME: 36.8 s — ABNORMAL HIGH (ref 9.8–12.8)

## 2022-03-30 LAB — SLIDE REVIEW

## 2022-03-30 LAB — CBC
HEMATOCRIT: 21.7 % — ABNORMAL LOW (ref 39.0–48.0)
HEMATOCRIT: 22.5 % — ABNORMAL LOW (ref 39.0–48.0)
HEMATOCRIT: 21.1 % — ABNORMAL LOW (ref 39.0–48.0)
HEMOGLOBIN: 7.9 g/dL — ABNORMAL LOW (ref 12.9–16.5)
HEMOGLOBIN: 8.1 g/dL — ABNORMAL LOW (ref 12.9–16.5)
HEMOGLOBIN: 7.6 g/dL — ABNORMAL LOW (ref 12.9–16.5)
MEAN CORPUSCULAR HEMOGLOBIN CONC: 36.3 g/dL — ABNORMAL HIGH (ref 32.0–36.0)
MEAN CORPUSCULAR HEMOGLOBIN CONC: 36.2 g/dL — ABNORMAL HIGH (ref 32.0–36.0)
MEAN CORPUSCULAR HEMOGLOBIN CONC: 35.9 g/dL (ref 32.0–36.0)
MEAN CORPUSCULAR HEMOGLOBIN: 34.3 pg — ABNORMAL HIGH (ref 25.9–32.4)
MEAN CORPUSCULAR HEMOGLOBIN: 34.3 pg — ABNORMAL HIGH (ref 25.9–32.4)
MEAN CORPUSCULAR HEMOGLOBIN: 34.1 pg — ABNORMAL HIGH (ref 25.9–32.4)
MEAN CORPUSCULAR VOLUME: 94.3 fL (ref 77.6–95.7)
MEAN CORPUSCULAR VOLUME: 94.9 fL (ref 77.6–95.7)
MEAN CORPUSCULAR VOLUME: 95.1 fL (ref 77.6–95.7)
MEAN PLATELET VOLUME: 10.1 fL (ref 6.8–10.7)
MEAN PLATELET VOLUME: 9.9 fL (ref 6.8–10.7)
PLATELET COUNT: >170 10*9/L (ref 150–450)
PLATELET COUNT: 136 10*9/L — ABNORMAL LOW (ref 150–450)
PLATELET COUNT: 121 10*9/L — ABNORMAL LOW (ref 150–450)
RED BLOOD CELL COUNT: 2.3 10*12/L — ABNORMAL LOW (ref 4.26–5.60)
RED BLOOD CELL COUNT: 2.37 10*12/L — ABNORMAL LOW (ref 4.26–5.60)
RED BLOOD CELL COUNT: 2.21 10*12/L — ABNORMAL LOW (ref 4.26–5.60)
RED CELL DISTRIBUTION WIDTH: 21.3 % — ABNORMAL HIGH (ref 12.2–15.2)
RED CELL DISTRIBUTION WIDTH: 21.5 % — ABNORMAL HIGH (ref 12.2–15.2)
RED CELL DISTRIBUTION WIDTH: 21.4 % — ABNORMAL HIGH (ref 12.2–15.2)
WBC ADJUSTED: 32.7 10*9/L — ABNORMAL HIGH (ref 3.6–11.2)
WBC ADJUSTED: 29.9 10*9/L — ABNORMAL HIGH (ref 3.6–11.2)
WBC ADJUSTED: 25.6 10*9/L — ABNORMAL HIGH (ref 3.6–11.2)

## 2022-03-30 LAB — HEMOGLOBIN AND HEMATOCRIT, BLOOD
HEMATOCRIT: 23.7 % — ABNORMAL LOW (ref 39.0–48.0)
HEMATOCRIT: 24.9 % — ABNORMAL LOW (ref 39.0–48.0)
HEMATOCRIT: 23.1 % — ABNORMAL LOW (ref 39.0–48.0)
HEMATOCRIT: 24.3 % — ABNORMAL LOW (ref 39.0–48.0)
HEMATOCRIT: 23.5 % — ABNORMAL LOW (ref 39.0–48.0)
HEMOGLOBIN: 8.5 g/dL — ABNORMAL LOW (ref 12.9–16.5)
HEMOGLOBIN: 9 g/dL — ABNORMAL LOW (ref 12.9–16.5)
HEMOGLOBIN: 8.3 g/dL — ABNORMAL LOW (ref 12.9–16.5)
HEMOGLOBIN: 8.4 g/dL — ABNORMAL LOW (ref 12.9–16.5)
HEMOGLOBIN: 8.4 g/dL — ABNORMAL LOW (ref 12.9–16.5)

## 2022-03-30 LAB — APTT
APTT: 49.6 s — ABNORMAL HIGH (ref 25.1–36.5)
APTT: 52 s — ABNORMAL HIGH (ref 25.1–36.5)
APTT: 225 s (ref 25.1–36.5)
HEPARIN CORRELATION: 0.3
HEPARIN CORRELATION: 0.3
HEPARIN CORRELATION: 1.3

## 2022-03-30 LAB — D-DIMER, QUANTITATIVE
D-DIMER QUANTITATIVE (CW,ML,HL,HS,CH,JS,JC,RX): 23558 ng{FEU}/mL — ABNORMAL HIGH (ref <=500)
D-DIMER QUANTITATIVE (CW,ML,HL,HS,CH,JS,JC,RX): 33274 ng{FEU}/mL — ABNORMAL HIGH (ref <=500)
D-DIMER QUANTITATIVE (CW,ML,HL,HS,CH,JS,JC,RX): 52793 ng{FEU}/mL — ABNORMAL HIGH (ref <=500)

## 2022-03-30 LAB — HIGH SENSITIVITY TROPONIN I - SINGLE
HIGH SENSITIVITY TROPONIN I: 99 ng/L (ref <=53)
HIGH SENSITIVITY TROPONIN I: 161 ng/L (ref <=53)
HIGH SENSITIVITY TROPONIN I: 146 ng/L (ref <=53)

## 2022-03-30 LAB — PLATELET COUNT: PLATELET COUNT: >140 10*9/L — ABNORMAL LOW (ref 150–450)

## 2022-03-30 LAB — CK: CREATINE KINASE TOTAL: 59 U/L

## 2022-03-30 MED ADMIN — phenylephrine 0.8 mg/10 mL (80 mcg/mL) injection: INTRAVENOUS | @ 21:00:00 | Stop: 2022-03-30

## 2022-03-30 MED ADMIN — tobramycin (NEBCIN) 275 mg in sodium chloride (NS) 0.9 % 100 mL IVPB: 275 mg | INTRAVENOUS | @ 20:00:00 | Stop: 2022-03-30

## 2022-03-30 MED ADMIN — baclofen (LIORESAL) tablet 30 mg: 30 mg | ORAL | @ 01:00:00

## 2022-03-30 MED ADMIN — norepinephrine 8 mg in dextrose 5 % 250 mL (32 mcg/mL) infusion PMB: 0-50 ug/min | INTRAVENOUS | @ 17:00:00

## 2022-03-30 MED ADMIN — vancomycin (VANCOCIN) IVPB 1000 mg (premix): 1000 mg | INTRAVENOUS | @ 21:00:00 | Stop: 2022-04-04

## 2022-03-30 MED ADMIN — methylPREDNISolone sodium succinate (PF) (Solu-MEDROL) injection 40 mg: 40 mg | INTRAVENOUS | @ 15:00:00

## 2022-03-30 MED ADMIN — norepinephrine 8 mg in dextrose 5 % 250 mL (32 mcg/mL) infusion PMB: 0-50 ug/min | INTRAVENOUS | @ 13:00:00

## 2022-03-30 MED ADMIN — NxStage/multiBic RFP 401 (+/- BB) 5000 mL - contains 4 mEq/L of potassium dialysis solution 5,000 mL: 5000 mL | INTRAVENOUS_CENTRAL | @ 03:00:00

## 2022-03-30 MED ADMIN — lactulose oral solution: 30 g | ORAL | @ 17:00:00

## 2022-03-30 MED ADMIN — metroNIDAZOLE (FLAGYL) IVPB 500 mg: 500 mg | INTRAVENOUS | @ 13:00:00 | Stop: 2022-04-05

## 2022-03-30 MED ADMIN — sodium bicarbonate injection 50 mEq: 50 meq | INTRAVENOUS | @ 22:00:00 | Stop: 2022-03-30

## 2022-03-30 MED ADMIN — fentaNYL (PF) (SUBLIMAZE) injection 50 mcg: 50 ug | INTRAVENOUS | @ 19:00:00 | Stop: 2022-03-31

## 2022-03-30 MED ADMIN — lactulose oral solution: 30 g | ORAL | @ 13:00:00

## 2022-03-30 MED ADMIN — NxStage/multiBic RFP 401 (+/- BB) 5000 mL - contains 4 mEq/L of potassium dialysis solution 5,000 mL: 5000 mL | INTRAVENOUS_CENTRAL | @ 14:00:00

## 2022-03-30 MED ADMIN — thiamine (B-1) injection 200 mg: 200 mg | INTRAVENOUS | @ 16:00:00

## 2022-03-30 MED ADMIN — fentaNYL (PF) (SUBLIMAZE) injection 50 mcg: 50 ug | INTRAVENOUS | @ 01:00:00 | Stop: 2022-03-31

## 2022-03-30 MED ADMIN — thiamine (B-1) injection 200 mg: 200 mg | INTRAVENOUS

## 2022-03-30 MED ADMIN — norepinephrine 8 mg in dextrose 5 % 250 mL (32 mcg/mL) infusion PMB: 0-30 ug/min | INTRAVENOUS | @ 03:00:00

## 2022-03-30 MED ADMIN — ondansetron (ZOFRAN-ODT) disintegrating tablet 4 mg: 4 mg | ORAL | @ 01:00:00

## 2022-03-30 MED ADMIN — vasopressin 20 units in 100 mL (0.2 units/mL) infusion premade vial: .03 [IU]/min | INTRAVENOUS | @ 08:00:00

## 2022-03-30 MED ADMIN — micafungin (MYCAMINE) 100 mg in sodium chloride (NS) 0.9 % 100 mL IVPB: 100 mg | INTRAVENOUS | @ 17:00:00

## 2022-03-30 MED ADMIN — thiamine (B-1) injection 200 mg: 200 mg | INTRAVENOUS | @ 08:00:00

## 2022-03-30 MED ADMIN — sodium bicarbonate 1 mEq/mL (8.4 %) injection: INTRAVENOUS | @ 22:00:00 | Stop: 2022-03-30

## 2022-03-30 MED ADMIN — iohexoL (OMNIPAQUE) 350 mg iodine/mL solution 100 mL: 100 mL | INTRAVENOUS | @ 21:00:00 | Stop: 2022-03-30

## 2022-03-30 MED ADMIN — norepinephrine 8 mg in dextrose 5 % 250 mL (32 mcg/mL) infusion PMB: 0-50 ug/min | INTRAVENOUS | @ 22:00:00

## 2022-03-30 MED ADMIN — ROCuronium (ZEMURON) injection: INTRAVENOUS | @ 21:00:00 | Stop: 2022-03-30

## 2022-03-30 MED ADMIN — NxStage/multiBic RFP 401 (+/- BB) 5000 mL - contains 4 mEq/L of potassium dialysis solution 5,000 mL: 5000 mL | INTRAVENOUS_CENTRAL | @ 09:00:00

## 2022-03-30 MED ADMIN — etomidate (AMIDATE) injection: INTRAVENOUS | @ 21:00:00 | Stop: 2022-03-30

## 2022-03-30 MED ADMIN — metroNIDAZOLE (FLAGYL) IVPB 500 mg: 500 mg | INTRAVENOUS | @ 04:00:00 | Stop: 2022-04-05

## 2022-03-30 MED ADMIN — sodium bicarbonate injection 50 mEq: 50 meq | INTRAVENOUS | @ 23:00:00 | Stop: 2022-03-30

## 2022-03-30 MED ADMIN — cefepime (MAXIPIME) 2 g in sodium chloride 0.9 % (NS) 100 mL IVPB-MBP: 2 g | INTRAVENOUS | @ 08:00:00 | Stop: 2022-03-30

## 2022-03-30 MED ADMIN — fentaNYL (PF) (SUBLIMAZE) injection 50 mcg: 50 ug | INTRAVENOUS | @ 22:00:00 | Stop: 2022-03-31

## 2022-03-30 MED ADMIN — calcium chloride 100 mg/mL (10 %) syringe 1 g: 1 g | INTRAVENOUS | @ 22:00:00 | Stop: 2022-03-30

## 2022-03-30 MED ADMIN — zinc sulfate (ZINCATE) capsule 220 mg: 220 mg | ORAL | @ 13:00:00

## 2022-03-30 MED ADMIN — vasopressin 20 units in 100 mL (0.2 units/mL) infusion premade vial: .03 [IU]/min | INTRAVENOUS | @ 18:00:00

## 2022-03-30 MED ADMIN — calcium chloride 100 mg/mL (10 %) syringe: INTRAVENOUS | @ 22:00:00 | Stop: 2022-03-30

## 2022-03-30 MED ADMIN — lactated ringers bolus 1,000 mL: 1000 mL | INTRAVENOUS | @ 15:00:00 | Stop: 2022-03-30

## 2022-03-30 MED ADMIN — norepinephrine 8 mg in dextrose 5 % 250 mL (32 mcg/mL) infusion PMB: 0-50 ug/min | INTRAVENOUS | @ 11:00:00

## 2022-03-30 MED ADMIN — phytonadione (vitamin K1) (AQUA-MEPHYTON) 10 mg in sodium chloride (NS) 0.9 % 50 mL IVPB: 10 mg | INTRAVENOUS | @ 13:00:00 | Stop: 2022-04-01

## 2022-03-30 MED ADMIN — NxStage/multiBic RFP 401 (+/- BB) 5000 mL - contains 4 mEq/L of potassium dialysis solution 5,000 mL: 5000 mL | INTRAVENOUS_CENTRAL | @ 19:00:00

## 2022-03-30 MED ADMIN — sodium bicarbonate injection 50 mEq: 50 meq | INTRAVENOUS | @ 21:00:00 | Stop: 2022-03-30

## 2022-03-30 MED ADMIN — fentaNYL (PF) (SUBLIMAZE) injection 50 mcg: 50 ug | INTRAVENOUS | Stop: 2022-03-31

## 2022-03-30 MED ADMIN — pantoprazole (PROTONIX) EC tablet 20 mg: 20 mg | ORAL | @ 13:00:00

## 2022-03-30 MED ADMIN — sodium chloride (NS) 0.9 % infusion: 200 mL/h | INTRAVENOUS | @ 04:00:00 | Stop: 2022-03-30

## 2022-03-30 MED ADMIN — rifAXIMin (XIFAXAN) tablet 550 mg: 550 mg | ORAL | @ 13:00:00 | Stop: 2023-03-27

## 2022-03-30 MED ADMIN — norepinephrine 8 mg in dextrose 5 % 250 mL (32 mcg/mL) infusion PMB: 0-50 ug/min | INTRAVENOUS | @ 21:00:00

## 2022-03-30 MED ADMIN — NxStage/multiBic RFP 401 (+/- BB) 5000 mL - contains 4 mEq/L of potassium dialysis solution 5,000 mL: 5000 mL | INTRAVENOUS_CENTRAL | @ 23:00:00

## 2022-03-30 MED ADMIN — metroNIDAZOLE (FLAGYL) IVPB 500 mg: 500 mg | INTRAVENOUS | @ 20:00:00 | Stop: 2022-04-05

## 2022-03-30 MED ADMIN — NxStage/multiBic RFP 401 (+/- BB) 5000 mL - contains 4 mEq/L of potassium dialysis solution 5,000 mL: 5000 mL | INTRAVENOUS_CENTRAL | @ 22:00:00

## 2022-03-30 MED ADMIN — meropenem (MERREM) 1 g in sodium chloride 0.9 % (NS) 100 mL IVPB-connector bag: 1 g | INTRAVENOUS | @ 17:00:00 | Stop: 2022-04-06

## 2022-03-30 MED ADMIN — sodium bicarbonate 150 mEq in dextrose 5 % 1,150 mL continuous infusion: INTRAVENOUS | @ 22:00:00 | Stop: 2022-03-30

## 2022-03-30 MED ADMIN — insulin lispro (HumaLOG) injection 0-20 Units: 0-20 [IU] | SUBCUTANEOUS | @ 05:00:00

## 2022-03-30 MED ADMIN — phenylephrine 100 mg in sodium chloride 0.9 % 250 mL (0.4 mg/mL) infusion: 0-300 ug/min | INTRAVENOUS | @ 21:00:00

## 2022-03-30 MED ADMIN — albumin human 25 % bottle 50 g: 50 g | INTRAVENOUS | @ 01:00:00 | Stop: 2022-03-29

## 2022-03-30 MED ADMIN — ondansetron (ZOFRAN) injection 4 mg: 4 mg | INTRAVENOUS | @ 19:00:00

## 2022-03-30 MED ADMIN — rifAXIMin (XIFAXAN) tablet 550 mg: 550 mg | ORAL | @ 01:00:00 | Stop: 2023-03-27

## 2022-03-30 MED ADMIN — fludrocortisone (FLORINEF) tablet 100 mcg: 100 ug | ORAL | @ 16:00:00

## 2022-03-30 MED ADMIN — carboxymethylcellulose sodium (THERATEARS) 0.25 % ophthalmic solution 1 drop: 1 [drp] | OPHTHALMIC | @ 01:00:00

## 2022-03-30 MED ADMIN — lidocaine (LIDODERM) 5 % patch 1 patch: 1 | TRANSDERMAL | @ 01:00:00

## 2022-03-30 MED ADMIN — sodium zirconium cyclosilicate (LOKELMA) packet 10 g: 10 g | ORAL | @ 13:00:00 | Stop: 2022-03-30

## 2022-03-30 NOTE — Unmapped (Cosign Needed)
Hepatology Consult Service   Progress Note         Assessment & Plan:   [name] is a 19M w/ decompensated cirrhosis 2/2 NASH (HE, ascites, G1EV), T2DM who is currently listed for transplant. He presented to ED via Liver clinic for nausea, vomiting, cough, SOB and worsening ascites, and was found to have rhinovirus which was presumed etiology of decompensation. Course has been complicated by hemoperitoneum and hemorrhagic shock (2/2 paracentesis, s/p VIR embolization) and renal failure/acidosis requiring CRRT.    Appreciate VIR and MICU support, patient critically ill from hemorrhagic shock. Supportive care as per below.     Recommendations:  - At least daily CBC, CMP, INR  - Currently listed for transplant; will try to capture higher MELD  - Continue home lactulose and rifaximin  - Continue home PPI  - Agree with supportive care per MICU team including broad spectrum abx, CVVH    MELD-Na: 39 at 03/29/2022  1:45 PM  MELD: 38 at 03/29/2022  1:45 PM  Calculated from:  Serum Creatinine: 2.16 mg/dL at 4/54/0981  1:91 PM  Serum Sodium: 126 mmol/L at 03/29/2022  1:45 PM  Total Bilirubin: 34.2 mg/dL at 4/78/2956  2:13 AM  INR(ratio): 2.46 at 03/29/2022 12:13 PM    I have communicated recommendations with the patient's primary team    Thank you for involving [place] in the care of your patient. We will continue to follow along with you.     For questions, contact the on-call fellow for the Hepatology Consult Service at [phone].     Interval History:   - Abd pain overnight; hypotensive; found to have active extrav on CT a/p likely 2/2 para earlier in day  - Hgb 13.2 -> 12.8 -> 10.6  - Went to [facility] for embo  - Worsening renal function post procedure w/ lactic acidosis, starting CVVH    Objective:   Temp:  [36 ??C (96.8 ??F)-37 ??C (98.6 ??F)] 36.1 ??C (97 ??F)  Heart Rate:  [65-103] 99  SpO2 Pulse:  [71-99] 99  Resp:  [13-29] 28  BP: (90-140)/(24-63) 140/48  SpO2:  [96 %-100 %] 100 %    Gen: Chronically ill-appearing male  Eyes: Sclera icteric  Abdomen: TTP    Pertinent Labs/Studies Reviewed and commented on above.

## 2022-03-30 NOTE — Unmapped (Signed)
Inpatient Follow Up Consult Note    Requesting Attending Physician :  Richrd Prime Fischer, *  Service Requesting Consult : Medical ICU (MDI)    Assessment/Plan:    Principal Problem:    Decompensated hepatic cirrhosis (CMS-HCC)  Active Problems:    Hepatic encephalopathy (CMS-HCC)    Obesity (BMI 30-39.9)    Type 2 diabetes mellitus without complication, without long-term current use of insulin (CMS-HCC)    Dyspnea    Hyponatremia    Abdominal hemorrhage    AKI (acute kidney injury) (CMS-HCC)  Resolved Problems:    * No resolved hospital problems. *   @LDAPRESSUREULCER @     Derek Boyer is a 41 y.o. y/o male that presents to [facility] with Decompensated hepatic cirrhosis (CMS-HCC).  Pt was seen at the request of Luz Brazen, * (Medical ICU (MDI)) in consultation for s/p paracentesis on 5/23 with a bleed from a branch of the internal iliac.    Pt now with acute hemorraghic shock and acute blood loss anemia in the setting of coagulopathy 2/2 liver disease.  I discussed the case with the MICU attending and I reviewed the CTA and the HGB. While the vessel involved was not visualized on prior day's US exam, likely this vessel was injured in the paracentesis give acute drop in HGB. Possibly prior bleeding given bloody peritoneal fluid.    Appreciate supportive care by the MICU. Recommend using the R side for further paracentesis given this outcome despite reassuring US exam. On the Left, pt's epigastric was visualized medial to the puncture site and I chose to move to a position in the middle of his iliac crest and the epigastric given vessels seen laterally and immediatly superior to the iliac crest likely representing branchs from the iliac.    Will sign off. Please page with further requests  ___________________________________________________________________    Subjective:    Pt seen and examined. MAR reviewed. Labs and studies reviewed.    Reviewed records. Discussed with nursing.     Objective:    Vital signs in last 24 hours:  Temp:  [36 ??C (96.8 ??F)-37 ??C (98.6 ??F)] 36 ??C (96.8 ??F)  Heart Rate:  [65-103] 92  SpO2 Pulse:  [71-99] 91  Resp:  [13-29] 25  BP: (90-140)/(24-57) 140/48  MAP (mmHg):  [47-80] 80  A BP-2: (80-141)/(43-70) 126/61  MAP:  [56 mmHg-90 mmHg] 81 mmHg  SpO2:  [96 %-100 %] 99 %    Intake/Output last 3 shifts:  I/O last 3 completed shifts:  In: 4581.8 [I.V.:844.6; Blood:1397.5; IV Piggyback:2339.7]  Out: 500 [Urine:500]    Physical Exam:  Gen: Lying in bed  HEENT: somnolent  Abd: Site with dressing c/d/I -- abd distended

## 2022-03-30 NOTE — Unmapped (Cosign Needed)
Plan of care:    VIR was contacted again today regarding patient's hemodynamic status with requirement of third pressor.  Patient's hemoglobin had dropped after the procedure yesterday at approximately 4 PM from 9.4-8.1.  However most recent hemoglobin only demonstrated a drop from 8.1-7.6.  The primary team is administering an additional unit of blood.  It is a possibility that the left inferior epigastric region that was embolized yesterday is fed by an additional contributory artery.  This drop in hemoglobin yesterday may have indeed also been dilutional, and the patient's WBC count is 30,000 which is also concerning for sepsis.      Plan:    We are going to watch the patient after his infusion this afternoon and he will have a repeat H&H drawn at approximately 4 PM.  If he has a significant drop in hemoglobin, then we will certainly take the patient for a second look.  However if he does not we will continue to watch and the primary team will continue to work-up possible underlying sepsis.  We have spoke with the team and they are amenable to the plan and will keep [place] updated.    Signed:  Epifania Gore. Rock Nephew, MD, IR Fellow  Department of VIR/Radiology, [facility]  Pager: [phone]

## 2022-03-30 NOTE — Unmapped (Signed)
Hepatology Consult Service   Progress Note         Assessment & Plan:   Derek Boyer is a 19M w/ decompensated cirrhosis 2/2 NASH (HE, ascites, G1EV), T2DM who is currently listed for transplant. He presented to ED via liver clinic for nausea, vomiting, cough, SOB and worsening ascites, and was found to have rhinovirus which was presumed etiology of decompensation. Course has been complicated by hemoperitoneum and hemorrhagic shock (2/2 paracentesis 5/22 with 6L removed, s/p VIR embolization 5/24) and renal failure/acidosis requiring CRRT.     Hypotensive shock - Hemorrhagic shock  Hemoperitoneum and hemorrhagic shock (2/2 paracentesis 5/22 with 6L removed, s/p VIR embolization 5/24. Today, norepinephrine requirements have been increasing and lactate increasing 7.7>8.8. Unclear etiology of increasing vasopressor requirements. TTE 5/25 without evidence of cardiogenic shock. Differential includes re-bleeding into his abdomen, but patient's Hgb has been relatively stable given Hgb 8.1>7.6 today. However, if there is concern for further bleeding, recommend CTA and VIR consult. He is also currently on broad spectrum antibiotics including cefepime, flagyl, vancomycin and WBC is downtrending. Only additional consideration would be to add micafungin for fungal coverage, which can be considered if he does not improve. Recommend following up on infectious work up.     Recommendations:  Continue to trend Hgb and monitor for active bleeding  If concern for intra-abdominal bleeding, recommend CTA and consulting VIR  Please only transfuse for Hgb < 7 and if there is evidence of active bleeding can transfuse platelets < 50, cryoprecipitate for fibrinogen < 100. Please avoid FFP to correct INR since the volume can worsen portal pressures and it is ineffective in correcting coagulopathy.   Agree with full infectious work-up: CXR, blood cultures    MELD-Na: 42 at 03/30/2022  7:02 AM  MELD: 43 at 03/30/2022  7:02 AM  Calculated from:  Serum Creatinine: 2.16 mg/dL at 1/61/0960  4:54 AM  Serum Sodium: 130 mmol/L at 03/30/2022  7:02 AM  Total Bilirubin: 33.7 mg/dL at 0/98/1191  4:78 AM  INR(ratio): 4.03 at 03/30/2022  3:49 AM    I have communicated recommendations with the patient's primary team    Thank you for involving [place] in the care of your patient. We will continue to follow along with you.     For questions, contact the on-call fellow for the Hepatology Consult Service at [phone].     Interval History:   - CRRT 10 ml/hr  - NE increasing currently 30, vaso  - WBC downtrending 36>32>30  - cefepime, flagyl, vanc  - last LVP 5/23 6L removed  - mental status good AOx3, no asterixis, having a few BM  - belly tight, pretibial edema    Objective:   Temp:  [34.8 ??C (94.6 ??F)-36.6 ??C (97.9 ??F)] 36 ??C (96.8 ??F)  Heart Rate:  [70-102] 70  SpO2 Pulse:  [70-99] 70  Resp:  [12-29] 18  BP: (140)/(48) 140/48  SpO2:  [95 %-100 %] 99 %    Gen: Chronically ill-appearing male  Neuro: patient is awake, alert, responding to questions appropriately  Eyes: Sclera icteric  Abdomen: TTP    Pertinent Labs/Studies Reviewed and commented on above.

## 2022-03-30 NOTE — Unmapped (Signed)
MICU Nightshift Note     Date of Service: 03/29/2022    Principal Problem:    Decompensated hepatic cirrhosis (CMS-HCC)  Active Problems:    Hepatic encephalopathy (CMS-HCC)    Obesity (BMI 30-39.9)    Type 2 diabetes mellitus without complication, without long-term current use of insulin (CMS-HCC)    Dyspnea    Hyponatremia    Abdominal hemorrhage    AKI (acute kidney injury) (CMS-HCC)  Resolved Problems:    * No resolved hospital problems. *          Cross cover summary   66 M w NASH cirrhosis c/b hepatic encephalopathy, T2DM, who presented to ED per recommendation of his liver transplant team for leukocytosis/dyspnea, cough, concern for infection, and worsening liver function. Admit to OBS 5/22. Now s/p LVP 5/23 (6L bloody output, alb 50g given), previous diag para 5/18 in clinic. RRT 5/24 early AM for softer BPs w worsened abd pain central and RLQ w nausea. CTA with subtle arterial bleed at para site lateral to inf epigastric artery with ~1L blood as well as separate mod ascites for which patient underwent embolization.     N: non focal neuro motor, cont baclofen and lido, morphine given for pain w good effect   P: NAI   CV: goal SBP>100   GI: home diuretics held as outpt d/t metabolic derangement which led to wt gain and inc ascites, cont lactulose, ppi, rifaximin, remain NPO for now, last LVP 5/23 for 6L bloody output  R: holding home lasix and spiro, cont home magox and zinc   H: hold chemo ppx for now, coagulopathic, started Vit K x 3d, LVP puncture site c/d/i, CTA per report by rad c/f subtle extrav at para site lateral to inferior epigastric artery now s/p embolization after acute drop in hgb and hypotension   E: accu check q6, hold home metformin   ID: broad spectrum abx    Overnight events:   Gave 75gm albumin due to bedside US with collapsible IVC with resp variation. CVP still 4 so given 1L NS with improvement in CVP to 8 and down trending lactate   Started on CRRT, bicarb gtt d/c   LFTs significantly elevated this am, will need to discuss utility of repeat US with hepatology however suspect shock liver from hemorraghic shock 5/24      Esther Hardy, MD

## 2022-03-30 NOTE — Unmapped (Signed)
Continuous Renal Replacement  Dialysis Nurse Therapy Procedure Note    Treatment Type:  [facility] Number Of Days On Therapy:   - Procedure Date:  04-12-2022 8:54 PM     TREATMENT STATUS:  Initiated  Patient and Treatment Status       None            Active Dialysis Orders (168h ago, onward)       Start     Ordered    04-12-22 1526  CRRT Orders - NxStage (Adult)  Continuous        Comments: Fluid removal parameters:   MAP <60: 10 ml/hr;  MAP 61 - 65: 50 ml/hr  MAP > 65: 75 ml/hr;   Question Answer Comment   CRRT System: NxStage    Modality: CVVH    Access: Right Internal Jugular    BFR (mL/min): Other (Specify)    Dialysate Flow Rate (mL/kg/hr): Other (Specify) 2.7L/hr   Fluid Removal Initial Rate (mL/hr): 10    Fluid Removal Parameters: see below        2022-04-12 1528                  SYSTEM CHECK:  Machine Name: [id number]  Dialyzer: [id number]   Self Test Completed: Yes.        Alarms Connected To The Wall And Active:  Yes.    VITAL SIGNS:  Temp:  [36 ??C (96.8 ??F)-37 ??C (98.6 ??F)] 36 ??C (96.8 ??F)  Heart Rate:  [65-103] 92  SpO2 Pulse:  [71-99] 91  Resp:  [13-29] 25  SpO2:  [96 %-100 %] 99 %  BP: (90-140)/(24-57) 140/48  MAP (mmHg):  [47-80] 80  A BP-2: (80-141)/(43-70) 126/61  MAP:  [56 mmHg-90 mmHg] 81 mmHg  CVP:  [6 mmHg] 6 mmHg    ACCESS SITE:        Hemodialysis Catheter With Distal Infusion Port 04-12-22 Right Internal jugular 1.2 mL 1.2 mL (Active)   Site Assessment Clean;Dry;Intact 12-Apr-2022 1600   Proximal Lumen Status / Patency Heparin locked 04/12/22 1600   Proximal Lumen Intervention Capped - dead end 12-Apr-2022 1600   Medial Lumen Status / Patency Heparin Locked April 12, 2022 1600   Medial Lumen Intervention Capped - dead end 04/12/22 1600   Lumen 3, Distal Status / Patency Blood Return - Brisk Apr 12, 2022 1600   Distal Lumen Intervention Patent 04-12-22 1600   Dressing Type CHG gel;Occlusive;Transparent 2022/04/12 1600   Dressing Status      Clean;Intact/not removed;Dry 04/12/22 1600   Dressing Intervention New dressing 04/12/2022 1600   Contraindicated due to: Dressing Intact surrounding insertion site Apr 12, 2022 1600   Dressing Change Due 04/04/22 12-Apr-2022 1600   Line Necessity Reviewed? Y 04-12-2022 1600   Line Necessity Indications Yes - Medications requiring central line access (Consult Pharmacy PRN) 04-12-22 1600   Line Necessity Reviewed With mdi 04-12-22 1600          CATHETER FILL VOLUMES:     Arterial: 1.2 mL  Venous: 1.2 mL     Lab Results   Component Value Date    NA 122 (L) 04/12/2022    K 5.3 (H) 04/12/22    CL 96 (L) 2022/04/12    CO2  04/12/22      Comment:      Specimen icteric.      BUN  Apr 12, 2022      Comment:      Specimen icteric.       Lab Results   Component Value Date  CALCIUM 9.7 03/29/2022    CAION 4.58 03/29/2022    PHOS 2.7 07/25/2021    MG 1.6 07/25/2021        SETTINGS:  Blood Pump Rate: 300 mL/min  Replacement Fluid Rate:     Pre-Blood Pump Fluid Rate:    Hourly Fluid Removal Rate: 10 mL/hr   Dialysate Fluid Rate    Therapy Fluid Temperature:       ANTICOAGULANT:  None    ADDITIONAL COMMENTS:  None    HEMODIALYSIS ON-CALL NURSE PAGER NUMBER:  Monday thru Saturday 0700 - 1730: Call the Dialysis Unit ext. [phone]   After 1730 and all day Sunday: Call the Dialysis RN Pager Number [phone]     PROCEDURE REVIEW, VERIFICATION, HANDOFF:  CRRT settings verified, procedure reviewed, and instructions given to primary RN.     Primary CRRT RN Verifying: Sherlyn Lick Dialysis RN Verifying: Nicholes Rough RN

## 2022-03-30 NOTE — Unmapped (Signed)
MICU Daily Progress Note     Date of Service: 03/30/2022    Problem List:   Principal Problem:    Decompensated hepatic cirrhosis (CMS-HCC)  Active Problems:    Hepatic encephalopathy (CMS-HCC)    Obesity (BMI 30-39.9)    Type 2 diabetes mellitus without complication, without long-term current use of insulin (CMS-HCC)    Dyspnea    Hyponatremia    Abdominal hemorrhage    AKI (acute kidney injury) (CMS-HCC)  Resolved Problems:    * No resolved hospital problems. *      Interval history: Derek Boyer is a 41 y.o. 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 Admitted to observation on 5/22 for decompensated cirrhosis . Now s/p diagnostic paracentesis on 5/18 in clinic and LVP 5/23 (6L bloody output, alb 50g given). RRT 5/24 early AM for softer BPs with worsened abdominal pain in umbilical region and RLQ with nausea. CTA abdomen demonstrated with subtle arterial bleed at para site lateral to inferior epigastric artery with ~1L blood as well as separate moderate ascites. Transferred to MICU for further management and decompensated requiring pressors and blood products.     Interval Events:  - elevated LFTs AST ~ 10,00, ALT ~ 3,700 likely shock liver  - remains of levo and vaso  - CRRT started overnight UF 10  - worsening shock, additional albumin and NS given overnight    Neurological   Pain:  - fentanyl q30 minutes for worsening abdominal pain in the setting of hemoperitoneum   - chronic back pain/spams: baclofen nightly and lidocaine patch    Pulmonary   Tachypnea: compensating for metabolic acidosis  - RA    Cardiovascular   Shock  Hemorrhagic Shock: likely complication for paracentesis likely on 5/23 as opposed to 5/18. CTA AP demonstrated arterial bleed at para site lateral to inferior epigastric artery with ~1L blood as well as separate moderate ascites. Bolus 2 L LR on arrival to the MICU. After VIR embolization on 5/24 patient continues to have worsening pressor requirement on 5/25 additional IVF given however continue to escalate to 3 pressor shock. Possible sepsis vs hemorrhagic from re-bleeding.  - continue epi, levo, and vaso for MAP goal > 65  - SvO2 82 and cards consulted for a STAT echo demonstrated likely not cardiogenic shock    Renal   Metabolic Acidosis: bicarb gtt stopped after CRRT started  Hyperkalemia  AKI: Baseline creatinine of 0.8 - 0.9 on admission was 1.13, with worsening metabolic acidosis, hyperkalemia, and creatinine of 1.99 on arrival to MICU.  - nephrology following  - started on CRRT on 5/24 UF 10  - ABGs q4h    Infectious Disease/Autoimmune   Concern for Infection: worsening pressors requirement and leukocytosis 20.7 on transfer to MICU. Empirically started on BSA for concern of infection from an abdominal source in the setting of cirrhosis with increased ascites and worsening pressor requirement.  - flagyl, cefepime, and vanc started 5/24 changed cefepime to mero, added micafungin and gave tobra x1 for worsening shock  - f/u cultures    Rhinovirus/Enterovirus: Patient reports headache, cough, malaise and with leukocytosis of 12.3 on admission. RVP positive for Rhinovirus/enterovirus.  - CTM      Cultures:  Blood Culture, Routine (no units)   Date Value   04/19/2022 No Growth at 48 hours     Urine Culture, Comprehensive (  no units)   Date Value   03/09/2022 NO GROWTH     WBC (10*9/L)   Date Value   03/30/2022 32.6 (H)     WBC, UA (/HPF)   Date Value   03/29/2022 <1        FEN/GI   Hemoperitoneum   Decompensated NASH Cirrhosis:  diag para 5/18 in clinic and LVP 5/23.  - lactulose BID for 3 -5 bowel movements a day  - rifaximin  - Protonix   - holding home lasix  - held ceftriaxone for SBP ppx   - see blood transfusion plan below  - continue home zinc    Ileus: seen on KUB on 5/25 with worsening abdomen distension   - NGT for decompression     Malnutrition Assessment: Not done yet.  Body mass index is 30.66 kg/m??.        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 There is hemoperitoneum, with blood tracking inferiorly along the left paracolic gutter and into the pelvis however embo likely difficult d/t caliber of vessels. 2  PRBC, 2FFP, and 1U Cryo. Now s/p VIR embolized branch of the left inferior epigastric artery.  - vit K x 3 doses for elevated INR  - transfused addition 1U PRBC and 1 U FFP  - s/p DDAVP during VIR procedure on 5/24  - holding DVT ppx  - monitor left groin site  - q8h DIC panel, q4h CBC  - fibrinogen goal > 100, hgb > 7, Platelets > 50    - Hepatology doesn't want FFP even if patient is bleeding given increased portal pressures with potential to worsen bleed    Endocrine   DM:   - holding home metformin  - ISS    Integumentary     - WOCN consulted for high risk skin assessment No. Reason: mobile.  - cont pressure mitigating precautions per skin policy    Prophylaxis/LDA/Restraints/Consults   Can CVC be removed? No: need for medications requiring central access (e.g. pressors), dialysis catheter   Can A-line be removed? No: >moderate dose pressors  Can Foley be removed? No: Need continuous I/O  Mobility plan: Step 2 - Head of bed elevation (>60 degrees)    Bowel regimen yes  Indwelling catheters & duration discussed  yes  De-escalation of catheters & antimicrobials discussed  yes    Feeding: Oral diet  Analgesia: Pain not adequately controlled, titrating medications  Sedation SAT/SBT: N/A  Thromboembolic ppx: Mechanical only, chemical contraindicated secondary to active bleeding in last 48 hours  Head of bed >30 degrees: Yes  Ulcer ppx: Yes, coagulopathy  Glucose within target range: Yes, in range    Does patient need/have an active type/screen? Yes    RASS at goal? N/A, not on sedation  [place] Agitation Assessment Scale (RASS) : -1 (03/30/2022  4:00 AM)     Can antipsychotics be stopped? N/A, not on antipsychotics       Would hospice care be appropriate for this patient? No, patient requiring support not compatible with hospice  Any unaddressed hospice/palliative care needs? no    Patient Lines/Drains/Airways Status     Active Active Lines, Drains, & Airways     Name Placement date Placement time Site Days    CVC Triple Lumen 03/29/22 Non-tunneled Left Internal jugular 03/29/22  1610  Internal jugular  1    Hemodialysis Catheter With Distal Infusion Port 03/29/22 Right Internal jugular 1.2 mL 1.2 mL 03/29/22  1508  Internal jugular  less than 1    Urethral Catheter 03/29/22  0840  --  1    Peripheral IV 03/29/22 Right Antecubital 03/29/22  0145  Antecubital  1    Peripheral IV 03/29/22 Anterior;Left Forearm 03/29/22  0729  Forearm  1    Arterial Line 03/29/22 Right Radial 03/29/22  0733  Radial  1              Patient Lines/Drains/Airways Status     Active Wounds     None                Goals of Care     Code Status: Full Code    Designated Healthcare Decision Maker:  Mr. Harriett Rush current decisional capacity for healthcare decision-making is Full capacity. His designated Educational psychologist) is/are   HCDM (patient stated preference): Markus Daft - Spouse - [phone].      Subjective   Reports worsening abdominal pain     Objective     Vitals - past 24 hours  Temp:  [34.8 ??C (94.6 ??F)-36.7 ??C (98.1 ??F)] 36 ??C (96.8 ??F)  Heart Rate:  [70-102] 70  SpO2 Pulse:  [70-99] 70  Resp:  [12-29] 18  BP: (140)/(48) 140/48  SpO2:  [95 %-100 %] 99 % Intake/Output  I/O last 3 completed shifts:  In: 7691.6 [P.O.:720; I.V.:2734.4; Blood:1397.5; IV Piggyback:2839.7]  Out: 772 [Urine:670; Other:102]     Physical Exam:    General: ill appearing, mild distress, laying in pain in discomfort  HEENT: moist mucus membrane  CV: RRR, no BLE edema  Pulm: tachypnenic, non labored, clear breath sounds  GI: worsening distension, soft, tender to palpation, umbilical hernia   MSK: MAE  Skin:  Juandice   Neuro: alert following commands, solmulent      Continuous Infusions:   ??? norepinephrine bitartrate-NS 22 mcg/min (03/30/22 0830)   ??? NxStage RFP 400 (+/- BB) 5000 mL - contains 2 mEq/L of potassium     ??? NxStage/multiBic RFP 401 (+/- BB) 5000 mL - contains 4 mEq/L of potassium     ??? sodium chloride     ??? sodium chloride     ??? sodium chloride     ??? vasopressin 0.03 Units/min (03/30/22 0330)       Scheduled Medications:   ??? baclofen  30 mg Oral Nightly   ??? carboxymethylcellulose sodium  1 drop Both Eyes QID   ??? Cefepime  2 g Intravenous Q12H   ??? insulin lispro  0-20 Units Subcutaneous Q6H   ??? lactulose  30 g Oral BID   ??? lidocaine  1 patch Transdermal Q24H   ??? metroNIDAZOLE  500 mg Intravenous Q8H   ??? pantoprazole  20 mg Oral Daily   ??? phytonadione (AQUA-MEPHYTON) intravenous  10 mg Intravenous Daily   ??? rifAXIMin  550 mg Oral BID   ??? thiamine  200 mg Intravenous Q8H   ??? vancomycin  1,000 mg Intravenous Q24H   ??? zinc sulfate  220 mg Oral Daily       PRN medications:  dextrose in water, fentaNYL (PF), glucagon, glucose, heparin (porcine), heparin (porcine), heparin (porcine), heparin (porcine), ondansetron **OR** ondansetron, simethicone    Data/Imaging Review: Reviewed in Epic and personally interpreted on 03/30/2022. See EMR for detailed results.       Critical Care Attestation  This patient is critically ill or injured with the impairment of vital organ systems such that there is a high probability of imminent or life threatening deterioration in the patient's condition. This patient must remain in the ICU for ongoing evaluation of the comprehensive management plan outlined in this note. I directly provided critical care services as documented in this note and the critical care time spent (90 min) is exclusive of separately billable procedures.  In addition to time spent for critical care management, I also provided advance care planning services for 0 minutes (see GOC above or ACP note for details)???. Total billable critical care time 90 minutes.    Toma Aran, PA

## 2022-03-30 NOTE — Unmapped (Signed)
VIR progress note     Assessment/Plan  -Patient doing well post angiogram and inferior epigastric artery embolization without evidence of complication   -Please page VIR if further concerns for bleeding   -Continue routine care per primary team, VIR will sign off at this time     Please contact VIR at [phone] (pager) if there are any further questions/concerns       Subjective:   Patient is doing well post embolization. Downtrend in Hgb from 9.5 to 8 this AM which may be stabilizing post procedure.     Objective:    Temp:  [34.8 ??C (94.6 ??F)-36.7 ??C (98.1 ??F)] 35.7 ??C (96.3 ??F)  Heart Rate:  [71-102] 71  SpO2 Pulse:  [70-99] 71  BP: (101-140)/(24-48) 140/48  MAP (mmHg):  [55-80] 80  MAP:  [56 mmHg-90 mmHg] 60 mmHg  A BP-2: (80-141)/(43-70) 89/45  MAP:  [56 mmHg-90 mmHg] 60 mmHg  CVP:  [3 mmHg-8 mmHg] 8 mmHg    I/O         05/23 0701  05/24 0700 05/24 0701  05/25 0700    P.O. 0 720    I.V.  2381.4    Blood  1397.5    IV Piggyback 100 2739.7    Total Intake 100 7238.6    Urine  655    Other  73    Stool  0    Total Output  728    Net +100 +6510.6          Stool Occurrence 0 x 1 x            General: NAD.    Neuro: Responds appropriately to questions. Moves 4/4 extremities.   Lungs: Unlabored respirations.   Extremity: left groin is soft without evidence of hematoma. Palpable pulse.

## 2022-03-30 NOTE — Unmapped (Cosign Needed)
Plan of care:    Background:  We were contacted at approximately 3 PM this afternoon again regarding the patient's possible bleeding status.  Team reported abrupt change in abdominal fullness/tightness and were concern for ongoing bleeding.  The patient was examined bedside and the indeed his abdomen was tight, however he has been eating relatively frequent paracenteses and has not been paracentesis since 03/29/2023.  Also on physical exam his abdomen is not tender which is very strange for a expanding hematoma secondary to extravasation.  A single unit of blood was given when the patient had a hemoglobin of 7.6 earlier this morning and he appropriately responded with a one-point increased to 8.5.  At this time, evidence does not point to active extravasation, however in order to be completely sure, a CTA of the abdomen is again recommended prior to attempted angiogram.    Assessment/plan:  We have spoken with the primary team and they are requiring the CTA abdomen pelvis.    We will go ahead and consent the patient for possible embolizations as to expedite the process if this is indeed the diagnosis.    --This procedure has been fully reviewed by me, Marita Kansas, MD, IR fellow with the patient/patient???s authorized representative. The risks specific to the procedure including nontargeted embolization, access site bleeding, infection.  Benefits and alternatives have been explained, and the patient/patient???s authorized representative has consented to the procedure.  --The patient will accept blood products in an emergent situation.  --The patient does not have a Do Not Resuscitate order in effect.      Signed:  Epifania Gore. Rock Nephew, MD, IR Fellow  Department of VIR/Radiology, [facility]  Pager: [phone]

## 2022-03-30 NOTE — Unmapped (Signed)
CVAD Liaison - Bleeding CVAD Note    CVAD Liaison Nurse assessed the patient at the bed side. The patient has a(n) triple lumen catheter in left IJ.     Bloody drainage was observed. Manual pressure was not held for 0 minutes. Statseal dressing was applied aseptically. Please call/page CVAD Liaison for any issues with this dressing or questions. Report given to the primary RN.    Thank you for this consult,  Jerrye Noble RN, CVAD Liaison    Consult Time  30 minutes (min)

## 2022-03-30 NOTE — Unmapped (Signed)
CONTINUOUS RENAL REPLACEMENT THERAPY INTRA PROCEDURE NOTE    Patient Derek Boyer was seen and examined on continuous renal replacement therapy Mar 30, 2022.    CHIEF COMPLAINT:  Acute Kidney Disease    INTERVAL HISTORY:   Continues to be on pressors.  U.O is poor.    CURRENT DIALYSIS PRESCRIPTION:  Device: CRRT Device: NxStage  Therapy fluid: Therapy Fluid : NxStage RFP 401 - Contains 4 mEq/L KCL  Therapy fluid rate: Therapy Fluid Rate (L/hr): 2.7 L/hr  Blood flow rate: Blood Pump Rate (mL/min): 300 mL/min  Fluid removal rate: Hourly Fluid Removal Rate (mL/hr): 10 mL/hr    PHYSICAL EXAM:  Vitals:  Temp:  [0 ??C (32 ??F)-36.6 ??C (97.9 ??F)] 36.1 ??C (97 ??F)  Heart Rate:  [70-102] 80  SpO2 Pulse:  [70-99] 80  MAP (mmHg):  [50] 50  MAP:  [53 mmHg-90 mmHg] 55 mmHg  A BP-2: (71-141)/(41-68) 71/44  MAP:  [53 mmHg-90 mmHg] 55 mmHg  CVP:  [3 mmHg-8 mmHg] 8 mmHg      Intake/Output Summary (Last 24 hours) at 03/30/2022 1021  Last data filed at 03/30/2022 0700  Gross per 24 hour   Intake 5044.37 ml   Output 772 ml   Net 4272.37 ml        Weights:  Admission Weight: 88.8 kg (195 lb 12.3 oz)  Last documented Weight: 88.8 kg (195 lb 12.3 oz)  Weight Change from Previous Day: No weight listed for specified days    Assessment:  General: appearing fatigued  Pulmonary: normal  Cardiovascular: normal  Extremities:  2+ edema to back     ACCESS:    LAB DATA:  Lab Results   Component Value Date    NA 128 (L) 03/30/2022    K 5.7 (H) 03/30/2022    CL 101 03/30/2022    CO2  03/30/2022      Comment:      Specimen icteric.    BUN  03/30/2022      Comment:      Specimen icteric.    CREATININE 2.16 (H) 03/30/2022    CALCIUM 8.5 (L) 03/30/2022    PHOS 2.7 07/25/2021    ALBUMIN 3.6 03/30/2022    ALBUMIN 3.8 03/30/2022     Lab Results   Component Value Date    HGB 8.1 (L) 03/30/2022    HCT 22.5 (L) 03/30/2022    PLT 136 (L) 03/30/2022       PLAN:  continuous venovenous hemofiltration. ultrafiltration rate: 10 ml/hr titrated to mean arterial pressure    Leeroy Bock, MD  [place] [facility] Nephrology & Hypertension

## 2022-03-31 LAB — BLOOD GAS CRITICAL CARE PANEL, ARTERIAL
BASE EXCESS ARTERIAL: -6.7 — ABNORMAL LOW (ref -2.0–2.0)
BASE EXCESS ARTERIAL: -9.3 — ABNORMAL LOW (ref -2.0–2.0)
BASE EXCESS ARTERIAL: -9.4 — ABNORMAL LOW (ref -2.0–2.0)
CALCIUM IONIZED ARTERIAL (MG/DL): 3.73 mg/dL — ABNORMAL LOW (ref 4.40–5.40)
CALCIUM IONIZED ARTERIAL (MG/DL): 3.8 mg/dL — ABNORMAL LOW (ref 4.40–5.40)
CALCIUM IONIZED ARTERIAL (MG/DL): 3.93 mg/dL — ABNORMAL LOW (ref 4.40–5.40)
GLUCOSE WHOLE BLOOD: 132 mg/dL (ref 70–179)
GLUCOSE WHOLE BLOOD: 136 mg/dL (ref 70–179)
GLUCOSE WHOLE BLOOD: 147 mg/dL (ref 70–179)
HCO3 ARTERIAL: 15 mmol/L — ABNORMAL LOW (ref 22–27)
HCO3 ARTERIAL: 15 mmol/L — ABNORMAL LOW (ref 22–27)
HCO3 ARTERIAL: 18 mmol/L — ABNORMAL LOW (ref 22–27)
HEMOGLOBIN BLOOD GAS: 6.5 g/dL — ABNORMAL LOW
HEMOGLOBIN BLOOD GAS: 7.3 g/dL — ABNORMAL LOW
HEMOGLOBIN BLOOD GAS: 8 g/dL — ABNORMAL LOW (ref 13.50–17.50)
LACTATE BLOOD ARTERIAL: 10.1 mmol/L (ref <1.3)
LACTATE BLOOD ARTERIAL: 10.2 mmol/L (ref <1.3)
LACTATE BLOOD ARTERIAL: 11.1 mmol/L (ref <1.3)
O2 SATURATION ARTERIAL: 97.6 % (ref 94.0–100.0)
O2 SATURATION ARTERIAL: 98.7 % (ref 94.0–100.0)
O2 SATURATION ARTERIAL: 99.6 % (ref 94.0–100.0)
PCO2 ARTERIAL: 25.7 mmHg — ABNORMAL LOW (ref 35.0–45.0)
PCO2 ARTERIAL: 26.4 mmHg — ABNORMAL LOW (ref 35.0–45.0)
PCO2 ARTERIAL: 29.4 mmHg — ABNORMAL LOW (ref 35.0–45.0)
PH ARTERIAL: 7.37 (ref 7.35–7.45)
PH ARTERIAL: 7.38 (ref 7.35–7.45)
PH ARTERIAL: 7.38 (ref 7.35–7.45)
PO2 ARTERIAL: 105 mmHg (ref 80.0–110.0)
PO2 ARTERIAL: 118 mmHg — ABNORMAL HIGH (ref 80.0–110.0)
PO2 ARTERIAL: 90.8 mmHg (ref 80.0–110.0)
POTASSIUM WHOLE BLOOD: 4.7 mmol/L — ABNORMAL HIGH (ref 3.4–4.6)
POTASSIUM WHOLE BLOOD: 4.7 mmol/L — ABNORMAL HIGH (ref 3.4–4.6)
POTASSIUM WHOLE BLOOD: 5.1 mmol/L — ABNORMAL HIGH (ref 3.4–4.6)
SODIUM WHOLE BLOOD: 132 mmol/L — ABNORMAL LOW (ref 135–145)
SODIUM WHOLE BLOOD: 132 mmol/L — ABNORMAL LOW (ref 135–145)
SODIUM WHOLE BLOOD: 135 mmol/L (ref 135–145)

## 2022-03-31 LAB — COMPREHENSIVE METABOLIC PANEL
ALBUMIN: 3.5 g/dL (ref 3.4–5.0)
ALKALINE PHOSPHATASE: 823 U/L — ABNORMAL HIGH (ref 46–116)
ALT (SGPT): 4811 U/L — ABNORMAL HIGH (ref 10–49)
AST (SGOT): 15917 U/L — ABNORMAL HIGH (ref <=34)
BILIRUBIN TOTAL: 33.7 mg/dL — ABNORMAL HIGH (ref 0.3–1.2)
CALCIUM: 9.1 mg/dL (ref 8.7–10.4)
CHLORIDE: 96 mmol/L — ABNORMAL LOW (ref 98–107)
CREATININE: 2.01 mg/dL — ABNORMAL HIGH
EGFR CKD-EPI (2021) MALE: 42 mL/min/{1.73_m2} — ABNORMAL LOW (ref >=60)
POTASSIUM: 5.7 mmol/L — ABNORMAL HIGH (ref 3.4–4.8)
PROTEIN TOTAL: 5.7 g/dL (ref 5.7–8.2)
SODIUM: 131 mmol/L — ABNORMAL LOW (ref 135–145)

## 2022-03-31 LAB — CBC
HEMATOCRIT: 23 % — ABNORMAL LOW (ref 39.0–48.0)
HEMOGLOBIN: 8.4 g/dL — ABNORMAL LOW (ref 12.9–16.5)
MEAN CORPUSCULAR HEMOGLOBIN CONC: 36.4 g/dL — ABNORMAL HIGH (ref 32.0–36.0)
MEAN CORPUSCULAR HEMOGLOBIN: 33.8 pg — ABNORMAL HIGH (ref 25.9–32.4)
MEAN CORPUSCULAR VOLUME: 93.1 fL (ref 77.6–95.7)
PLATELET COUNT: >114 10*9/L — ABNORMAL LOW (ref 150–450)
RED BLOOD CELL COUNT: 2.47 10*12/L — ABNORMAL LOW (ref 4.26–5.60)
RED CELL DISTRIBUTION WIDTH: 20.4 % — ABNORMAL HIGH (ref 12.2–15.2)
WBC ADJUSTED: 21.9 10*9/L — ABNORMAL HIGH (ref 3.6–11.2)

## 2022-03-31 LAB — HEPATIC FUNCTION PANEL
ALBUMIN: 3.7 g/dL (ref 3.4–5.0)
ALBUMIN: 3.5 g/dL (ref 3.4–5.0)
ALKALINE PHOSPHATASE: 716 U/L — ABNORMAL HIGH (ref 46–116)
ALKALINE PHOSPHATASE: 823 U/L — ABNORMAL HIGH (ref 46–116)
ALT (SGPT): 4566 U/L — ABNORMAL HIGH (ref 10–49)
ALT (SGPT): 4811 U/L — ABNORMAL HIGH (ref 10–49)
AST (SGOT): 11955 U/L — ABNORMAL HIGH (ref <=34)
AST (SGOT): 15917 U/L — ABNORMAL HIGH (ref <=34)
BILIRUBIN DIRECT: 18.9 mg/dL — ABNORMAL HIGH (ref 0.00–0.30)
BILIRUBIN DIRECT: 18.3 mg/dL — ABNORMAL HIGH (ref 0.00–0.30)
BILIRUBIN TOTAL: 32.5 mg/dL — ABNORMAL HIGH (ref 0.3–1.2)
BILIRUBIN TOTAL: 33.7 mg/dL — ABNORMAL HIGH (ref 0.3–1.2)
PROTEIN TOTAL: 5.9 g/dL (ref 5.7–8.2)
PROTEIN TOTAL: 5.7 g/dL (ref 5.7–8.2)

## 2022-03-31 LAB — APTT
APTT: 61.9 s — ABNORMAL HIGH (ref 25.1–36.5)
APTT: 51.5 s — ABNORMAL HIGH (ref 25.1–36.5)
HEPARIN CORRELATION: 0.4
HEPARIN CORRELATION: 0.3

## 2022-03-31 LAB — PROTIME-INR
INR: 2.17
INR: 2.27
PROTIME: 25.3 s — ABNORMAL HIGH (ref 9.8–12.8)
PROTIME: 26.5 s — ABNORMAL HIGH (ref 9.8–12.8)

## 2022-03-31 LAB — FIBRINOGEN
FIBRINOGEN LEVEL: 141 mg/dL — ABNORMAL LOW (ref 175–500)
FIBRINOGEN LEVEL: 167 mg/dL — ABNORMAL LOW (ref 175–500)

## 2022-03-31 LAB — GLUCOSE, BODY FLUID: GLUCOSE FLUID: 108 mg/dL

## 2022-03-31 MED ADMIN — fentaNYL PF (SUBLIMAZE) (50 mcg/mL) infusion (bag): 0-200 ug/h | INTRAVENOUS | @ 12:00:00 | Stop: 2022-03-31

## 2022-03-31 MED ADMIN — EPINEPhrine 8 mg in dextrose 5% 250 mL (32 mcg/mL) infusion PMB: 0-30 ug/min | INTRAVENOUS | @ 09:00:00 | Stop: 2022-03-31

## 2022-03-31 MED ADMIN — NxStage/multiBic RFP 401 (+/- BB) 5000 mL - contains 4 mEq/L of potassium dialysis solution 5,000 mL: 5000 mL | INTRAVENOUS_CENTRAL | @ 02:00:00

## 2022-03-31 MED ADMIN — calcium gluconate in sodium chloride (NS) 0.9% 2 gram/100 mL IVPB 2 g: 2 g | INTRAVENOUS | @ 04:00:00

## 2022-03-31 MED ADMIN — baclofen (LIORESAL) tablet 30 mg: 30 mg | ORAL | @ 01:00:00

## 2022-03-31 MED ADMIN — phenylephrine 100 mg in sodium chloride 0.9 % 250 mL (0.4 mg/mL) infusion: 0-300 ug/min | INTRAVENOUS | @ 09:00:00 | Stop: 2022-03-31

## 2022-03-31 MED ADMIN — norepinephrine 8 mg in dextrose 5 % 250 mL (32 mcg/mL) infusion PMB: 0-50 ug/min | INTRAVENOUS | @ 07:00:00 | Stop: 2022-03-31

## 2022-03-31 MED ADMIN — LORazepam (ATIVAN) injection 1 mg: 1 mg | INTRAVENOUS | @ 12:00:00 | Stop: 2022-03-31

## 2022-03-31 MED ADMIN — LORazepam (ATIVAN) 2 mg/mL injection: INTRAVENOUS | @ 02:00:00 | Stop: 2022-03-30

## 2022-03-31 MED ADMIN — EPINEPhrine 8 mg in dextrose 5% 250 mL (32 mcg/mL) infusion PMB: 0-30 ug/min | INTRAVENOUS | @ 01:00:00

## 2022-03-31 MED ADMIN — sodium chloride 0.9% (NS) bolus 1,000 mL: 1000 mL | INTRAVENOUS | @ 02:00:00 | Stop: 2022-03-30

## 2022-03-31 MED ADMIN — LORazepam (ATIVAN) injection 2 mg: 2 mg | INTRAVENOUS | @ 02:00:00

## 2022-03-31 MED ADMIN — midazolam (VERSED) 1 mg/mL injection: INTRAVENOUS | @ 02:00:00 | Stop: 2022-03-30

## 2022-03-31 MED ADMIN — fentaNYL (PF) (SUBLIMAZE) injection 50 mcg: 50 ug | INTRAVENOUS | @ 08:00:00 | Stop: 2022-03-31

## 2022-03-31 MED ADMIN — meropenem (MERREM) 1 g in sodium chloride 0.9 % (NS) 100 mL IVPB-connector bag: 1 g | INTRAVENOUS | @ 02:00:00 | Stop: 2022-04-06

## 2022-03-31 MED ADMIN — sodium bicarbonate injection 50 mEq: 50 meq | INTRAVENOUS | @ 01:00:00 | Stop: 2022-03-30

## 2022-03-31 MED ADMIN — norepinephrine 8 mg in dextrose 5 % 250 mL (32 mcg/mL) infusion PMB: 0-50 ug/min | INTRAVENOUS | @ 04:00:00 | Stop: 2022-03-31

## 2022-03-31 MED ADMIN — midazolam (VERSED) injection 2 mg: 2 mg | INTRAVENOUS | @ 02:00:00 | Stop: 2022-03-30

## 2022-03-31 MED ADMIN — calcium gluconate 3 g in sodium chloride (NS) 0.9 % 250 mL IVPB: 3 g | INTRAVENOUS | @ 08:00:00 | Stop: 2022-03-31

## 2022-03-31 MED ADMIN — phenylephrine 100 mg in sodium chloride 0.9 % 250 mL (0.4 mg/mL) infusion: 0-300 ug/min | INTRAVENOUS | @ 03:00:00

## 2022-03-31 MED ADMIN — LORazepam (ATIVAN) injection 2 mg: 2 mg | INTRAVENOUS | @ 08:00:00 | Stop: 2022-03-31

## 2022-03-31 MED ADMIN — fentaNYL (PF) (SUBLIMAZE) injection 50 mcg: 50 ug | INTRAVENOUS | @ 02:00:00 | Stop: 2022-03-31

## 2022-03-31 MED ADMIN — prothrombin complex concentrate (KCENTRA) 2,460 Units in BLOOD FACTOR DILUENT 100 mL infusion: 2460 [IU] | INTRAVENOUS | @ 02:00:00 | Stop: 2022-03-30

## 2022-03-31 MED ADMIN — fentaNYL (PF) (SUBLIMAZE) injection 50 mcg: 50 ug | INTRAVENOUS | @ 01:00:00 | Stop: 2022-03-31

## 2022-03-31 MED ADMIN — thiamine (B-1) injection 200 mg: 200 mg | INTRAVENOUS | @ 08:00:00 | Stop: 2022-03-31

## 2022-03-31 MED ADMIN — metroNIDAZOLE (FLAGYL) IVPB 500 mg: 500 mg | INTRAVENOUS | @ 04:00:00 | Stop: 2022-03-31

## 2022-03-31 MED ADMIN — sodium bicarbonate 1 mEq/mL (8.4 %) injection: INTRAVENOUS | @ 01:00:00 | Stop: 2022-03-30

## 2022-03-31 MED ADMIN — LORazepam (ATIVAN) injection 1 mg: 1 mg | INTRAVENOUS | @ 10:00:00 | Stop: 2022-03-31

## 2022-03-31 MED ADMIN — LORazepam (ATIVAN) 2 mg/mL injection: INTRAVENOUS | @ 10:00:00 | Stop: 2022-03-31

## 2022-03-31 MED ADMIN — sodium chloride 0.9% (NS) bolus 500 mL: 500 mL | INTRAVENOUS | @ 03:00:00 | Stop: 2022-03-31

## 2022-03-31 MED ADMIN — vasopressin 20 units in 100 mL (0.2 units/mL) infusion premade vial: .03 [IU]/min | INTRAVENOUS | @ 07:00:00 | Stop: 2022-03-31

## 2022-03-31 MED ADMIN — carboxymethylcellulose sodium (THERATEARS) 0.25 % ophthalmic solution 1 drop: 1 [drp] | OPHTHALMIC | @ 01:00:00

## 2022-03-31 MED ADMIN — NxStage/multiBic RFP 401 (+/- BB) 5000 mL - contains 4 mEq/L of potassium dialysis solution 5,000 mL: 5000 mL | INTRAVENOUS_CENTRAL | @ 07:00:00 | Stop: 2022-03-31

## 2022-03-31 MED ADMIN — fentaNYL PF (SUBLIMAZE) (50 mcg/mL) infusion (bag): 0-200 ug/h | INTRAVENOUS | @ 01:00:00 | Stop: 2022-03-30

## 2022-03-31 MED ADMIN — norepinephrine 8 mg in dextrose 5 % 250 mL (32 mcg/mL) infusion PMB: 0-50 ug/min | INTRAVENOUS | @ 01:00:00

## 2022-03-31 MED ADMIN — rifAXIMin (XIFAXAN) tablet 550 mg: 550 mg | ORAL | @ 01:00:00 | Stop: 2023-03-27

## 2022-03-31 MED ADMIN — albumin human 5 % 5 % bottle 25 g: 25 g | INTRAVENOUS | @ 01:00:00 | Stop: 2022-03-30

## 2022-03-31 NOTE — Unmapped (Signed)
CTA performed and reviewed, with stable hemoperitoneum and no extravasation to suggest active arterial bleeding.     - Given negative CTA and patient with appropriate response to 1 unit prbc, there is no role for VIR intervention at this time  - Continue to monitor h/h; transfuse prn  - Trend vitals/labs  - If patient clinical status changes, VIR is available    Dr. Isidoro Donning discussed the plan with MICU.    Please call VIR with any questions or concerns.    Collene Leyden  PGY-6 Interventional Radiology

## 2022-03-31 NOTE — Unmapped (Signed)
Received notfication on 04-09-22 at 0845 am time that Derek Boyer died on 04-09-22 date due to infection.  Successfully removed Derek Boyer from [facility] waiting list on 04/09/22 11:49 AM.  Updated demographic and transplant episode to deceased.  Notified primary coordinator via in basket routed message.

## 2022-03-31 NOTE — Unmapped (Signed)
7a shift summary:  Patient transitioned to comfort care. Expired at 0800. Family at bedside and team aware.    Problem: Adult Inpatient Plan of Care  Goal: Plan of Care Review  03/08/2022 0841 by Geanie Berlin, RN  Outcome: Patient Expired  04/05/2022 0841 by Geanie Berlin, RN  Outcome: Patient Expired  Goal: Patient-Specific Goal (Individualized)  03/06/2022 0841 by Geanie Berlin, RN  Outcome: Patient Expired  03/29/2022 0841 by Geanie Berlin, RN  Outcome: Patient Expired  Goal: Absence of Hospital-Acquired Illness or Injury  03/09/2022 0841 by Geanie Berlin, RN  Outcome: Patient Expired  03/29/2022 0841 by Geanie Berlin, RN  Outcome: Patient Expired  Goal: Optimal Comfort and Wellbeing  03/25/2022 0841 by Geanie Berlin, RN  Outcome: Patient Expired  03/13/2022 0841 by Geanie Berlin, RN  Outcome: Patient Expired  Goal: Readiness for Transition of Care  03/19/2022 0841 by Geanie Berlin, RN  Outcome: Patient Expired  03/14/2022 0841 by Geanie Berlin, RN  Outcome: Patient Expired  Goal: Rounds/Family Conference  03/25/2022 0841 by Geanie Berlin, RN  Outcome: Patient Expired  03/11/2022 0841 by Geanie Berlin, RN  Outcome: Patient Expired     Problem: Infection  Goal: Absence of Infection Signs and Symptoms  04/01/2022 0841 by Geanie Berlin, RN  Outcome: Patient Expired  03/13/2022 0841 by Geanie Berlin, RN  Outcome: Patient Expired     Problem: Diabetes Comorbidity  Goal: Blood Glucose Level Within Targeted Range  04/05/2022 0841 by Geanie Berlin, RN  Outcome: Patient Expired  03/30/2022 0841 by Geanie Berlin, RN  Outcome: Patient Expired     Problem: Pain Acute  Goal: Acceptable Pain Control and Functional Ability  03/18/2022 0841 by Geanie Berlin, RN  Outcome: Patient Expired  03/11/2022 0841 by Geanie Berlin, RN  Outcome: Patient Expired     Problem: Gastrointestinal Complications (Liver Failure)  Goal: Optimal Gastrointestinal Function  03/27/2022 0841 by Geanie Berlin, RN  Outcome: Patient Expired  03/06/2022 0841 by Geanie Berlin, RN  Outcome: Patient Expired     Problem: Impaired Coagulation (Liver Failure)  Goal: Optimal Coagulation Function  03/22/2022 0841 by Geanie Berlin, RN  Outcome: Patient Expired  03/23/2022 0841 by Geanie Berlin, RN  Outcome: Patient Expired     Problem: Pain (Liver Failure)  Goal: Optimal Pain Control  03/16/2022 0841 by Geanie Berlin, RN  Outcome: Patient Expired  03/28/2022 0841 by Geanie Berlin, RN  Outcome: Patient Expired     Problem: Fall Injury Risk  Goal: Absence of Fall and Fall-Related Injury  03/12/2022 0841 by Geanie Berlin, RN  Outcome: Patient Expired  03/25/2022 0841 by Geanie Berlin, RN  Outcome: Patient Expired     Problem: Non-Violent Restraints  Goal: Patient will remain free of restraint events  03/13/2022 0841 by Geanie Berlin, RN  Outcome: Patient Expired  03/22/2022 0841 by Geanie Berlin, RN  Outcome: Patient Expired  Goal: Patient will remain free of physical injury  03/09/2022 0841 by Geanie Berlin, RN  Outcome: Patient Expired  03/07/2022 0841 by Geanie Berlin, RN  Outcome: Patient Expired     Problem: Skin Injury Risk Increased  Goal: Skin Health and Integrity  03/17/2022 0841 by Geanie Berlin, RN  Outcome: Patient Expired  03/14/2022 0841 by Geanie Berlin, RN  Outcome: Patient Expired

## 2022-03-31 NOTE — Unmapped (Cosign Needed)
MICU Nightshift Note     Date of Service: 04/12/2022    Principal Problem:    Decompensated hepatic cirrhosis (CMS-HCC)  Active Problems:    Hepatic encephalopathy (CMS-HCC)    Obesity (BMI 30-39.9)    Type 2 diabetes mellitus without complication, without long-term current use of insulin (CMS-HCC)    Dyspnea    Hyponatremia    Abdominal hemorrhage    AKI (acute kidney injury) (CMS-HCC)  Resolved Problems:    * No resolved hospital problems. *          Cross cover summary       N: started on scheduled ativan due to noticeable discomfort    P: Remains in respiratory failure, still on 80%  CV: Remains in persistent shock on multiple vasopressors (phenylepi, epi, norepi, and vaso)  GI: Abdomen tense, bladder pressures nearing 21 cm  R: Remains in renal failure on CRRT  H: coagulopathic, remains frequently requiring blood products   E: accu check q6, receiving stress dose steroids  ID: broad spectrum abx  ??  Overnight events:   Initial paracentesis only diagnostic in nature, performed late in the evening, with bloody aspirate. Received multiple blood products overnight, was volume responsive with regards to blood pressure. Therapeutic paracentesis performed at 5:00 am due to rising bladder pressures to 21 cm, removed 1.4L bloody ascitic fluid.     In depth discussion had with family, plans to transition to comfort care when ready.     Eustaquio Boyden, MD

## 2022-03-31 NOTE — Unmapped (Signed)
ADVANCE CARE PLANNING NOTE    Discussion Date:  Mar 30, 2022    Patient has decisional capacity:  No    Patient has selected a Health Care Decision-Maker if loses capacity: Yes    Health Care Decision Maker as of 03/30/2022    HCDM (patient stated preference): Derek Boyer - [phone]    Discussion Participants:  Wife Hailey, sister, parents, Dr. Chandra Batch    Communication of Medical Status/Prognosis:   We discussed his persistent despite full life support including mechanical ventilation, 4 vasopressors at or near max dose, CRRT, HCO3 gtt, his blood pressure was continue to drop and that he would likely not survive the evening    Communication of Treatment Goals/Options:   We discussed what we know of his wishes.  His wife said that he would never want to be resuscitated to the point where he would be a vegetable. I explained it is difficult to predict the neurologic outcome but that given his multiorgan failure he would likely not benefit from chest compressions or shocks.  His wife said she does not want him to be in pain    Treatment Decisions:   We will continue with full aggressive care but if his heart stops we will not do chest compressions/shocks.  Code status was changed to DNR.      I spent 40 minutes providing voluntary advance care planning services for this patient.

## 2022-03-31 NOTE — Unmapped (Signed)
Physician Discharge Summary    Identifying Information:   Derek Boyer  Nov 30, 1980  [id number]    Admit date: 03/26/2022    Discharge date: 04-Apr-2022     Discharge Service: Medical ICU (MDI)    Discharge Attending Physician: Luz Brazen, MD    Discharge to: Deceased -  Derek Boyer had a pronouncement of death 04/04/22 and the time of death is 0800 . The pronouncement of death was made by Caesar Chestnut.   The events prior to death were worsening refractory hypotension.  The parties present at time of death was/were family. The patient's HCPOA was notified of the death. This case was not referred to the medical examiner. An autopsy was declined.    Discharge Diagnoses:  Principal Problem:    Decompensated hepatic cirrhosis (CMS-HCC)  Active Problems:    Hepatic encephalopathy (CMS-HCC)    Obesity (BMI 30-39.9)    Type 2 diabetes mellitus without complication, without long-term current use of insulin (CMS-HCC)    Dyspnea    Hyponatremia    Abdominal hemorrhage    AKI (acute kidney injury) (CMS-HCC)  Resolved Problems:    * No resolved hospital problems. 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 He was initially admitted to observation on 5/22 for decompensated cirrhosis . He receive a  diagnostic paracentesis on 5/18 in clinic and large volume paracentesis 5/23 (6L bloody output noted, subsequently alb 50g given). He had a rapid repose called 5/24 early AM for decreased blood pressures with worsened abdominal pain in umbilical region and RLQ with nausea. CTA abdomen demonstrated with subtle arterial bleed at paracentesis site lateral to inferior epigastric artery with ~1L blood as well as separate moderate ascites. He was transferred to the medical ICU for further management and decompensated requiring pressors and blood products.  ??  Unfortunately, he continued to deteriorate after arrival to the medical ICU. He required initiation of 4 vasopressors to manage his shock, was on broad spectrum abx, and received multiple blood products. He developed acute liver failure, acute renal failure (requiring CRRT), and acute respiratory failure requiring intubation.   ??  Given his significant illness, his family elected to transition him to comfort care on Apr 04, 2022 at 6:30 am with plans for terminal extubation. He died on 2022-04-04 at 0800    Post Discharge Follow Up Issues:       Procedures:  arterial line, internal jugular line and dialysis  No admission procedures for hospital encounter.  _____________________________________________________________________________  Discharge Day Services:  BP 140/48  - Pulse (!) 0  - Temp 36.6 ??C (97.9 ??F)  - Resp 10  - Ht 170.2 cm (5' 7.01)  - Wt 88.8 kg (195 lb 12.3 oz)  - SpO2 100%  - BMI 30.65 kg/m??   Pt seen on the day of discharge and determined appropriate for discharge.    Condition at Discharge: deceased    Length of Discharge: I spent less than 30 mins in the discharge of this patient.  _____________________________________________________________________________  Discharge Medications:     Your Medication List      STOP taking these medications    baclofen 10 MG tablet  Commonly known as: LIORESAL     CENTRUM ORAL     furosemide 20 MG tablet  Commonly known as: LASIX     lactulose 10 gram/15 mL solution     magnesium oxide 400 mg (241.3 mg elemental) tablet  Commonly known as: MAG-OX     metFORMIN 500 MG 24 hr tablet  Commonly known as: GLUCOPHAGE-XR     omeprazole 20 MG capsule  Commonly known as: PriLOSEC     ondansetron 4 MG disintegrating tablet  Commonly known as: ZOFRAN-ODT     ONE-A-DAY MEN'S COMPLETE 240 mcg-30 mcg- 300 mcg Tab  Generic drug: multivit,calc,min-FA-K1-lycop     spironolactone 100 MG tablet  Commonly known as: ALDACTONE     XIFAXAN 550 mg Tab  Generic drug: rifAXIMin     zinc gluconate 50 mg (7 mg elemental zinc) tablet          _____________________________________________________________________________  Pending Test Results (if blank, then none):  Pending Labs     Order Current Status    Albumin Level, Body Fluid Collected (04-15-22 0525)    Glucose, Body Fluid Collected (Apr 15, 2022 0525)    Protein, Body Fluid Collected (April 15, 2022 0525)    Ascitic/Peritoneal Fluid Culture Preliminary result    Blood Culture #1 Preliminary result    Blood Culture #2 Preliminary result    Blood Culture, Adult Preliminary result    Blood Culture, Adult Preliminary result          Most Recent Labs:  Microbiology Results (last day)     Procedure Component Value Date/Time Date/Time    Body fluid cell count [id number]] Collected: April 15, 2022 0525    Lab Status: Final result Specimen: Fluid, Ascitic Updated: 15-Apr-2022 0847     Fluid Type Fluid, Ascitic     Color, Fluid Red     Appearance, Fluid Opaque     Nucleated Cells, Fluid 3,414 ul      RBC, Fluid 802,004 ul      Neutrophil %, Fluid 79.0 %      Lymphocytes %, Fluid 2.0 %      Mono/Macro % , Fluid 18.0 %      Eosinophils %, Fluid 1.0 %      #Cells Counted BF Diff 100    Ascitic/Peritoneal Fluid Culture [[id number]] Collected: 2022/04/15 0539    Lab Status: Preliminary result Specimen: Fluid, Ascitic from Abdomen Updated: 04-15-2022 0700     Gram Stain Result Direct Specimen Gram Stain      2+ Polymorphonuclear leukocytes      No organisms seen    Narrative:      Specimen Source: Peritoneum    Protein, Body Fluid [[id number]] Collected: 2022-04-15 0525    Lab Status: No result Specimen: Fluid, Ascitic     Albumin Level, Body Fluid [[id number]] Collected: 04/15/2022 0525    Lab Status: No result Specimen: Fluid, Ascitic     Blood Culture #2 [[id number]]  (Normal) Collected: 03/23/2022 1724    Lab Status: Preliminary result Specimen: Blood from 1 Peripheral Draw Updated: 03/30/22 1830     Blood Culture, Routine No Growth at 72 hours    Blood Culture #1 [[id number]]  (Normal) Collected: 04/04/2022 1720    Lab Status: Preliminary result Specimen: Blood from 1 Peripheral Draw Updated: 03/30/22 1830     Blood Culture, Routine No Growth at 72 hours    Blood Culture, Adult [[id number]]  (Normal) Collected: 03/29/22 1542    Lab Status: Preliminary result Specimen: Blood from 1 Peripheral Draw Updated: 03/30/22 1700     Blood Culture, Routine No Growth at 24 hours    Blood Culture, Adult [[id number]]  (Normal)  Collected: 03/29/22 1542    Lab Status: Preliminary result Specimen: Blood from 1 Peripheral Draw Updated: 03/30/22 1700     Blood Culture, Routine No Growth at 24 hours          Lab Results   Component Value Date    WBC 21.9 (H) 04-19-22    HGB 8.4 (L) 2022-04-19    HCT 23.0 (L) 04/19/2022    PLT >114 (L) April 19, 2022       Lab Results   Component Value Date    NA 132 (L) Apr 19, 2022    NA 131 (L) 04/19/2022    K 5.1 (H) 2022-04-19    K 5.7 (H) 04-19-2022    CL 96 (L) Apr 19, 2022    CO2  Apr 19, 2022      Comment:      Specimen icteric.     BUN  04/19/22      Comment:      Specimen icteric.     CREATININE 2.01 (H) 04-19-2022    CALCIUM 9.1 04-19-22    MG 1.6 07/25/2021    PHOS 2.7 07/25/2021       Lab Results   Component Value Date    ALKPHOS 823 (H) 2022/04/19    ALKPHOS 823 (H) Apr 19, 2022    BILITOT 33.7 (H) 04-19-2022    BILITOT 33.7 (H) 04-19-22    BILIDIR 18.30 (H) April 19, 2022    PROT 5.7 19-Apr-2022    PROT 5.7 2022/04/19    ALBUMIN 3.5 04-19-2022    ALBUMIN 3.5 04/19/2022    ALT 4,811 (H) 04-19-2022    ALT 4,811 (H) Apr 19, 2022    AST 15,917 (H) Apr 19, 2022    AST 15,917 (H) 19-Apr-2022       Lab Results   Component Value Date    PT 26.5 (H) 04/19/2022    INR 2.27 April 19, 2022    APTT 51.5 (H) 2022/04/19     Hospital Radiology:  ECG 12 Lead    Result Date: 03/30/2022  NORMAL SINUS RHYTHM NORMAL ECG WHEN COMPARED WITH ECG OF 29-Mar-2022 01:15, NO SIGNIFICANT CHANGE WAS FOUND    ECG 12 Lead    Result Date: 03/29/2022  NORMAL SINUS RHYTHM NORMAL ECG WHEN COMPARED WITH ECG OF 19-Mar-2022 14:08, NO SIGNIFICANT CHANGE WAS FOUND Confirmed by Eldred Manges ([id number]) on 03/29/2022 11:53:26 AM    XR Chest Portable    Result Date: 04-19-2022  EXAM: XR CHEST PORTABLE DATE: 03/30/2022 6:38 PM ACCESSION: [id number] UN DICTATED: 03/30/2022 6:47 PM INTERPRETATION LOCATION: [facility] CLINICAL INDICATION: 41 years old Male with ETT (VENTILATOR/RESPIRATOR DEP STATUS)  TECHNIQUE: Single View AP Chest Radiograph. COMPARISON: Chest radiograph 03/30/2021 FINDINGS: Endotracheal tube with tip projecting 0.7 cm above the carina. Interval placement of enteric tube which coils in the stomach with tip and side-port overlying the stomach. Bilateral internal jugular central venous catheters are unchanged. Low lung volumes with bibasilar atelectasis. Small bilateral pleural effusions. No pneumothorax. Stable cardiac silhouette.     Endotracheal tube with tip projecting 0.7 cm above the carina. Recommend retracting 2 to 3 cm. Interval placement of enteric tube which coils in the stomach with tip and side-port overlying the stomach. ++++++++++++++++++++ The findings of this study were discussed via telephone with DR. Dibble by Dr. Alison Murray on 03/30/2022 6:50 PM. -----------------------------------------------    XR Chest Portable    Result Date: 03/30/2022  EXAM: XR CHEST PORTABLE DATE: 03/30/2022 3:44 PM ACCESSION: [id number] UN DICTATED: 03/30/2022 4:05 PM INTERPRETATION LOCATION: [facility] CLINICAL INDICATION: 41 years old Male with eval for air under diaphragm,  upright cxr ; OTHER  TECHNIQUE: Single View AP Chest Radiograph. COMPARISON: 03/29/2022. FINDINGS: Unchanged support devices. Lungs are low in volume with bibasilar atelectasis. No pleural effusion or pneumothorax. Unremarkable cardiomediastinal silhouette.     Hypoinflated lungs with bibasilar atelectasis.    XR Chest Portable    Result Date: 03/29/2022  EXAM: XR CHEST PORTABLE DATE: 03/29/2022 3:56 PM ACCESSION: [id number] UN DICTATED: 03/29/2022 4:12 PM INTERPRETATION LOCATION: [facility] CLINICAL INDICATION: 41 years old Male with CATHETER VASCULAR FIT&ADJ  TECHNIQUE: Single View AP Chest Radiograph. COMPARISON: Earlier same day chest radiograph FINDINGS: Interval placement of a right hemodialysis catheter, with tip projecting over the near cavoatrial junction.. Left internal jugular central venous catheter tip terminates over the superior cavoatrial junction. . Low lung volumes. Linear left basilar atelectasis. No pneumothorax. Stable cardiomediastinal silhouette.     Interval placement of a right hemodialysis catheter with tip projecting over the superior cavoatrial junction.. Left basilar atelectasis. No pneumothorax.    XR Chest Portable    Result Date: 03/29/2022  EXAM: XR CHEST PORTABLE DATE: 03/29/2022 9:11 AM ACCESSION: [id number] UN DICTATED: 03/29/2022 9:13 AM INTERPRETATION LOCATION: [facility] CLINICAL INDICATION: 41 years old Male with LINE CHECK (CATHETER VASCULAR FIT)  TECHNIQUE: Single View AP Chest Radiograph. COMPARISON: 30-Mar-2022 chest radiograph FINDINGS: Interval placement of left internal jugular catheter, which terminates over the right atrium. Hypoinflated lungs. Linear left basilar subsegmental atelectasis, unchanged. There is no pneumothorax. Stable cardiomediastinal silhouette.     Left internal jugular catheter terminates over the right atrium.    XR Chest Portable    Result Date: 03-30-2022  EXAM: XR CHEST PORTABLE DATE: Mar 30, 2022 5:10 PM ACCESSION: [id number] UN DICTATED: 2022/03/30 5:19 PM INTERPRETATION LOCATION: [facility] CLINICAL INDICATION: 41 years old Male with SHORTNESS OF BREATH  TECHNIQUE: Single View AP Chest Radiograph. COMPARISON: 03/19/2022 FINDINGS: Left lower linear atelectasis. Low lung volumes. No pleural effusion or pneumothorax. Unremarkable cardiomediastinal silhouette.     Left lower lung linear atelectasis. Low lung volumes.    CTA Abdomen Pelvis W Wo Contrast    Result Date: 03/29/2022  EXAM: CTA of the abdomen and pelvis DATE: 03/29/2022 2:23 AM ACCESSION: [id number] UN DICTATED: 03/29/2022 2:41 AM INTERPRETATION LOCATION: [facility] CLINICAL INDICATION: 41 years old Male with worsening abodminal pain, hemoglobin drop; eval for intrabdominal blood products/infection/perforation ; Peritonitis or perforation suspected  COMPARISON: Abdominal MRI 02/01/2022 TECHNIQUE: A noncontrasted CT of the abdomen/pelvis was obtained followed by a helical CTA scan with IV contrast from the lung bases to the symphysis pubis in arterial phase.  Images were reconstructed in the axial plane. Multiplanar reformatted and MIP images were provided for evaluation of the vasculature. FINDINGS: LOWER CHEST: Left basilar atelectasis versus scarring. LIVER: Cirrhotic liver morphology. No focal arterially enhancing liver lesions.  BILIARY: Gallbladder is not definitely visualized. There are surgical clips in the region of the gallbladder fossa.. No biliary ductal dilatation. SPLEEN: Splenomegaly. PANCREAS: Normal pancreatic contour.  No focal lesions.  No ductal dilation. ADRENAL GLANDS: Normal appearance of the adrenal glands. KIDNEYS: Symmetric renal enhancement.  No hydronephrosis.  No solid renal mass. Mildly displaced medially by the increased peritoneal pressure from ascites and hemoperitoneum. GI TRACT: Mild diffuse dilatation of the small bowel loops, likely reactive. No findings of bowel obstruction or acute inflammation. Normal appendix. PERITONEUM, RETROPERITONEUM AND MESENTERY: Diffuse abdominopelvic ascites likely from patient's cirrhosis. However, there is superimposed hemoperitoneum, tracking from the left lower abdominal wall along the ipsilateral paracolic gutter and into the pelvis. Likely source of the patient's bleeding from recent paracentesis within  the rectus sheath deep to the tract from recent paracentesis (6:165, 166). LYMPH NODES: No adenopathy. VESSELS: Hepatic and portal veins are patent.  Normal caliber aorta. Upper abdominal varices. BONES and SOFT TISSUES: No aggressive osseous lesions. Mild degenerative changes in the lumbar spine. There is a circumferential disc bulge at L4-L5, which contributes to at least moderate spinal canal narrowing. No focal soft tissue lesions. Small umbilical hernia containing ascitic fluid. Bilateral gynecomastia     -- Subtle active extravasation from a small vessel within the left lateral abdominal wall musculature, likely at the site of recent paracentesis. This vessel tract laterally to the inferior epigastric artery. Hemoperitoneum, with blood tracking inferiorly along the left paracolic gutter and into the pelvis. -- There is also simple appearing ascites throughout the abdomen consistent with patient's cirrhotic liver morphology. Additional stigmata of portal hypertension including splenomegaly and upper abdominal varices ++++++++++++++++++++ The findings of this study were discussed via telephone with DR. ANDREW W SHORT by Dr. Donnal Moat on 03/29/2022 2:50 AM. ----------------------------------------------- Jeannett Senior name: Isidoro Donning, MD I reviewed the stored images and agree with the report as written.     US Liver Doppler    Result Date: 03/22/2022  EXAM: US LIVER DOPPLER DATE: 03/17/2022 6:26 PM ACCESSION: [id number] UN DICTATED: Mar 31, 2022 7:22 PM INTERPRETATION LOCATION: [facility] CLINICAL INDICATION: 41 years old Male with decompensated cirrhosis  COMPARISON: Liver Doppler 5//2023 and prior TECHNIQUE: Ultrasound views of the complete abdomen were obtained using grayscale, color Doppler, and spectral Doppler analysis. FINDINGS: LIVER: The liver was heterogeneous and nodular. No focal hepatic lesions. No intrahepatic biliary ductal dilatation. The common bile duct was normal in caliber.      Liver: 15.3 cm      Common bile duct: 0.40 cm GALLBLADDER: The gallbladder is surgically absent. PANCREAS: Visualized portion was unremarkable. SPLEEN: Splenomegaly.      Spleen: 19.7 cm KIDNEYS: Normal in size and echotexture. No solid masses or calculi. No hydronephrosis.      Right kidney: 12.3 cm      Left kidney: 13.4 cm VESSELS - Portal vein: The main, left and right portal veins were patent with hepatopetal flow. Normal main portal vein velocity (0.20 m/s or greater)      Main portal vein diameter: 1.0 cm      Main portal vein velocity: 0.09 m/s      Anterior right portal vein velocity: 0.05 m/s      Posterior right portal vein velocity: 0.04 m/s      Left portal vein velocity: 0.06 m/s      Main portal vein flow: hepatofugal      Right portal vein flow: hepatofugal      Left portal vein flow: hepatofugal - Splenic vein: Patent, with hepatopetal flow.      Splenic vein midline: Non Vis      Splenic vein proximal: hepatopetal - Hepatic veins/IVC: The IVC, left, middle and right hepatic veins were patent with bi/triphasic waveforms.      Left hepatic vein flow: bi-tri      Middle hepatic vein flow: bi-tri      Right hepatic vein flow: bi-tri      Inferior vena cava flow: bi-tri - Hepatic artery: Patent with color and spectral Doppler imaging      Common hepatic artery: Patent - Visualized proximal aorta:  unremarkable      Aorta: partially visualized OTHER: Moderate volume ascites.     -Patent hepatic vasculature with hepatofugal flow seen within the portal vessels.  There is new hepatofugal flow seen within the posterior right portal vein and left portal vein when compared to prior. Findings are secondary to known liver cirrhosis. -Cirrhosis with sequela of portal hypertension including splenomegaly. Moderate volume ascites. Please see below for data measurements: Liver: 15.3 cm Common bile duct: 0.40 cm Gallbladder wall: Surgically absent cm Right kidney: 12.3 cm Left kidney: 13.4 cm Main portal vein diameter: 1.0 cm Main portal vein velocity: 0.09 m/s Anterior right portal vein velocity: 0.05 m/s Posterior right portal vein velocity: 0.04 m/s Left portal vein velocity: 0.06 m/s Main portal vein flow: hepatofugal Right portal vein flow: hepatofugal Left portal vein flow: hepatofugal Common hepatic artery: Patent Left hepatic vein flow: bi-tri Middle hepatic vein flow: bi-tri Right hepatic vein flow: bi-tri Inferior vena cava flow: bi-tri Splenic vein midline: Non Vis Splenic vein proximal: hepatopetal Aorta: partially visualized Inferior vena cava: Partially visualized Spleen: 19.7 cm Abdominal free fluid visualized: yes     Echocardiogram Follow Up/Limited Echo    Result Date: 03/30/2022  Patient Info Name:     Derek Boyer Age:     40 years DOB:     11-20-1980 Gender:     Male MRN:     [id number] Accession #:     [id number] UN Ht:     170 cm Wt:     89 kg BSA:     2.08 m2 BP:     88 /     48 mmHg Technical Quality:     Fair Exam Date:     03/30/2022 10:19 AM Site Location:     [facility] Exam Location:     [facility] Admit Date:     2022-04-19 Exam Type:     ECHOCARDIOGRAM FOLLOW UP/LIMITED ECHO Study Info Indications      - worsening pressor requirement Limited 2D, color flow and Doppler transthoracic echocardiogram is performed. Staff Referring Physician:     [id number], Neal Dy; Reading Fellow:     Concha Se MD Sonographer:     Redmond Pulling Ordering Physician:     Toma Aran Account #:     [id number] Summary   1. Limited study to assess ventricular function.   2. The left ventricular systolic function is hyperdynamic, LVEF is visually estimated at 70%.   3. The left atrium is mildly dilated in size.   4. The right ventricle is normal in size, with normal systolic function. Left Ventricle   The left ventricle is normal in size with normal wall thickness.   The left ventricular systolic function is hyperdynamic, LVEF is visually estimated at 70%. Right Ventricle   The right ventricle is normal in size, with normal systolic function. Ventricular Septum   The ventricular septum is sigmoid shaped. Left Atrium   The left atrium is mildly dilated in size. Right Atrium   The right atrium is normal  in size. Mitral Valve   The mitral valve leaflets are normal with normal leaflet mobility.   Chordal SAM is noted. Pericardium/Pleural   There is no pericardial effusion. Inferior Vena Cava   The IVC is suboptimally visualized but probably suggests normal right atrial pressure. Left Ventricular Outflow Tract ---------------------------------------------------------------------- Name                                 Value        Normal ---------------------------------------------------------------------- LVOT 2D ---------------------------------------------------------------------- LVOT Diameter  2.0 cm               LVOT Area                          3.1 cm2               LVOT Doppler ---------------------------------------------------------------------- LVOT Peak Velocity                 1.9 m/s               LVOT VTI                             38 cm               LVOT Stroke Volume                  118 ml Venous ---------------------------------------------------------------------- Name                                 Value        Normal ---------------------------------------------------------------------- IVC/SVC ---------------------------------------------------------------------- IVC Diameter (Exp 2D)               2.1 cm         <=2.1 Ventricles ---------------------------------------------------------------------- Name                                 Value        Normal ---------------------------------------------------------------------- LV Dimensions 2D/MM ---------------------------------------------------------------------- LVOT Diameter                       2.0 cm Report Signatures Finalized by Kennis Carina  MD on 03/30/2022 01:59 PM Resident Boyd Kerbs  MD on 03/30/2022 12:11 PM    XR Abdomen 1 View    Result Date: 03/30/2022  EXAM: XR ABDOMEN 1 VIEW DATE: 03/30/22 ACCESSION: [id number] UN DICTATED: 03/30/2022 3:43 PM INTERPRETATION LOCATION: [facility] CLINICAL INDICATION: 41 years old Male with INTESTINAL OBSTRUCTION  COMPARISON: CTA abdomen pelvis 03/29/2022 TECHNIQUE: Supine view of the abdomen. FINDINGS: Urinary catheter overlying the pelvis. Mildly dilated loops of small bowel with gas and small volume of stool overlying the colon. Gas overlying the rectum. No acute osseous abnormalities. Partially visualized lungs are clear.     Mildly dilated loops of small bowel without other evidence of mechanical obstruction. Correlate for ileus.    CTA Abdomen Pelvis Gi Bleed    Result Date: 03/30/2022  EXAM: CTA GI Bleed DATE: 03/30/2022 4:40 PM ACCESSION: [id number] UN DICTATED: 03/30/2022 4:45 PM INTERPRETATION LOCATION: [facility] CLINICAL INDICATION: eval for bleeding, run through mid thigh please  COMPARISON: CTA abdomen and pelvis 03/29/2022 TECHNIQUE: A spiral CT scan was obtained without IV contrast from the lung bases to the pubic symphysis. Images were reconstructed in the axial plane. Next, a spiral CTA scan was obtained with IV contrast from the lung bases to the pubic symphysis in both arterial and delayed venous phases. Images were reconstructed in the axial plane. Multiplanar reformatted and MIP images were provided for further evaluation of the vessels. For selected cases, 3D volume rendered images are also provided. VASCULAR FINDINGS: ABDOMINAL AORTA: Patent. Normal caliber.   CELIAC ARTERY: Patent.   SMA: Patent.   RENAL ARTERIES: Patent.   IMA: Patent.  RIGHT COMMON ILIAC ARTERY: Patent. RIGHT EXTERNAL ILIAC ARTERY: Patent. RIGHT COMMON FEMORAL/PROFUNDA FEMORAL/SFA: Patent.    LEFT COMMON ILIAC ARTERY: Patent. LEFT EXTERNAL ILIAC ARTERY: Patent. LEFT COMMON FEMORAL/PROFUNDA FEMORAL/SFA: Patent. PORTAL/MESENTERIC VEINS: Patent portal vein. Upper abdominal varices.    NON-VASCULAR FINDINGS: LOWER THORAX: Bibasilar atelectasis.Marland Kitchen HEPATOBILIARY: Nodular contours. Gallbladder is absent. No biliary dilatation.  SPLEEN: Splenomegaly. PANCREAS: Unremarkable. ADRENALS: Unremarkable. KIDNEYS/URETERS: No solid lesions. No hydronephrosis. BLADDER: Decompressed with indwelling Foley catheter. PELVIC/REPRODUCTIVE ORGANS: Unremarkable. GI TRACT: Dilated and stacked loops of small bowel with an air-fluid level. There is no definitive transition point, although evaluation is limited due to patient motion PERITONEUM/RETROPERITONEUM AND MESENTERY: Mixed attenuation fluid in the abdomen consistent with blood products mixed with ascites. Small foci of intraperitoneal free air, likely postprocedural (5:199). LYMPH NODES: No enlarged lymph nodes. BONES AND SOFT TISSUES: Fluid containing umbilical hernia. Gynecomastia. Degenerative changes of thoracolumbar spine.     -No active extravasation.  Stable hemoperitoneum. 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 -Dilated loops of small bowel without a definitive transition point. This could represent ileus versus low-grade obstruction.    IR Embolization Hemorrhage Art Or Ven  Lymphatic Extravasation    Result Date: 03/30/2022  EXAM: IR EMBOLIZATION HEMORRHAGE ART OR VEN  LYMPHATIC EXTRAVASATION DATE: 03/29/2022 12:00 PM ACCESSION: [id number] UN DICTATED: 03/30/2022 7:51 AM INTERPRETATION LOCATION: [facility] PROCEDURE: Pelvic embolization Pre-procedure diagnosis: Post paracentesis bleeding Post-procedure diagnosis: Same Indication: Pelvic bleeding Additional clinical history: None Complications: No immediate complications. Assessment: Angiography demonstrates active extravasation from a branch of the left inferior epigastric artery. Successful embolization with hydrogel. Plan: Patient to return to MICU for further management. _______________________________________________________________ PROCEDURE SUMMARY: - Arterial access with ultrasound guidance - Aortography: Not performed - Internal iliac angiography: Not performed - External iliac angiography: Unilateral - Superselective angiography: Performed as described below - Embolization and post-embolization angiography as described below - Additional procedure(s): None PROCEDURE DETAILS: Pre-procedure Consent: Informed consent for the procedure including risks, benefits and alternatives was obtained and time-out was performed prior to the procedure. Preparation: The site was prepared and draped using maximal sterile barrier technique including cutaneous antisepsis. Anesthesia/sedation Level of anesthesia/sedation: Moderate sedation (conscious sedation) Anesthesia/sedation administered by: Other-ICU nursing team Access Local anesthesia was administered. The vessel was sonographically evaluated and judged to be patent. Real time ultrasound was used to visualize needle entry into the vessel and a permanent image was stored. A 5Fr sheath was placed. Vessel accessed: Left common femoral artery Access technique: Micropuncture set with 21 gauge needle Aortography Not performed. Left pelvic angiography The left pelvic arterial system was catheterized using a 5Fr Van schie. Indication for angiography: Diagnostic angiography - There was no prior catheter-based angiographic study available and a full diagnostic study was performed. The decision to intervene was based on the diagnostic study. Vessel catheterized: Left inferior epigastric artery Findings: Normal caliber and branching. Active extravasation noted from a branch of the left inferior epigastric artery. Vessel catheterized: Left inferior epigastric artery lateral branch was catheterized using a Progreat microcatheter Findings: Pseudoaneurysm with active extravasation noted from lateral branch of inferior epigastric artery. Pelvic embolization Catheter position for embolization #1: Left inferior epigastric artery lateral branch - Embolic(s): Hydrogel - Angiographic endpoint: Stasis Completion angiography Vessel catheterized: Left inferior epigastric artery Findings: Liquid embolic noted in lateral branch of left inferior epigastric artery. No evidence of active extravasation Closure Access site angiography performed: Yes Findings: Patent vessel with appropriate access level Arterial closure technique: Angioseal Hemostasis achieved from closure technique: Yes Duration of  manual compression (minutes): 3 Contrast Contrast agent: Omnipaque 300 Contrast volume (mL): 70 Radiation Dose Fluoroscopy time (minutes): 8.3  Reference air kerma (Cumulative Dose) (mGy): 458.7 Kerma area product (Total Dose Area Product) (uGy-m2): 7591.3     Please refer to the assessment in the executive summary portion of the report. Attestation Signer name: Benjamine Mola, MD I attest that I was present for the entire procedure. I reviewed the stored images and agree with the report as written.        _____________________________________________________________________________  Discharge Instructions:                   Appointments which have been scheduled for you    Apr 04, 2022 11:00 AM  (Arrive by 10:30 AM)  LVP with [employer] INFUSION CHAIR 3  [facility] TRANSPLANT SURGERY [facility] REGION) [address]  [place] [address]  [phone]      Apr 07, 2022 12:00 PM  (Arrive by 11:30 AM)  ALBUMIN 90 with [employer] INFUSION CHAIR 1  [facility] TRANSPLANT SURGERY [place] [facility] REGION) [address]  [place] [address]  [phone]      Apr 10, 2022  1:30 PM  (Arrive by 1:15 PM)  RETURN  HEPATOLOGY with Annie Paras, MD  [facility] GI MEDICINE [place] [facility] REGION) [address]  [place] [address]  [phone]      Apr 11, 2022  9:00 AM  (Arrive by 8:30 AM)  LVP with [employer] INFUSION CHAIR 3  [facility] TRANSPLANT SURGERY [place] [facility] REGION) [address]  [place] [address]  [phone]      Apr 14, 2022 12:00 PM  (Arrive by 11:30 AM)  ALBUMIN 90 with SURTRA INFUSION CHAIR 1  [facility] TRANSPLANT SURGERY [place] REGION) [address]  [place] [address]  [phone]      Apr 18, 2022 11:00 AM  (Arrive by 10:30 AM)  LVP with [employer] INFUSION CHAIR 3  [facility] TRANSPLANT SURGERY [place] [facility] REGION) [address]  [place] [address]  [phone]      Apr 21, 2022 12:00 PM  (Arrive by 11:30 AM)  ALBUMIN 90 with SURTRA INFUSION CHAIR 1  [facility] TRANSPLANT SURGERY [place] [facility] REGION) [address]  [place] [address]  [phone]      Apr 25, 2022 11:00 AM  (Arrive by 10:30 AM)  LVP with [employer] INFUSION CHAIR 3  [facility] TRANSPLANT SURGERY [facility] REGION) [address]  [place] [address]  [phone]      Apr 28, 2022 12:00 PM  (Arrive by 11:30 AM)  ALBUMIN 90 with SURTRA INFUSION CHAIR 1  [facility] TRANSPLANT SURGERY [place] [facility] REGION) [address]  [place] [address]  [phone]      May 02, 2022 11:00 AM  (Arrive by 10:30 AM)  LVP with [employer] INFUSION CHAIR 3  [facility] TRANSPLANT SURGERY [place] [facility] REGION) [address]  [place] [address]  [phone]      May 05, 2022 12:00 PM  (Arrive by 11:30 AM)  ALBUMIN 90 with [employer] INFUSION CHAIR 1  [facility] TRANSPLANT SURGERY [place] [facility] REGION) [address]  [place] [address]  [phone]      May 12, 2022 12:00 PM  (Arrive by  11:30 AM)  ALBUMIN 90 with SURTRA INFUSION CHAIR 1  [facility] TRANSPLANT SURGERY [facility] REGION) [address]  [place] [address]  [phone]      May 16, 2022 11:00 AM  (Arrive by 10:30 AM)  LVP with [employer] INFUSION CHAIR 3  [facility] TRANSPLANT SURGERY [place] [facility] REGION) [address]  [place] [address]  [phone]      May 19, 2022 12:00 PM  (Arrive by 11:30 AM)  ALBUMIN 90 with SURTRA INFUSION CHAIR 1  [facility] TRANSPLANT SURGERY [place] [facility] REGION) [address]  [place] [address]  [phone]      May 23, 2022 11:00 AM  (Arrive by 10:30 AM)  LVP with [employer] INFUSION CHAIR 3  [facility] TRANSPLANT SURGERY [place] [facility] REGION) [address]  [place] [address]  [phone]      May 26, 2022 12:00 PM  (Arrive by 11:30 AM)  ALBUMIN 90 with SURTRA INFUSION CHAIR 1  [facility] TRANSPLANT SURGERY [place] [facility] REGION) [address]  [place] [address]  [phone]      May 30, 2022 11:00 AM  (Arrive by 10:30 AM)  LVP with [employer] INFUSION CHAIR 3  [facility] TRANSPLANT SURGERY [place] [facility] REGION) [address]  [place] [address]  [phone]      Jun 02, 2022 12:00 PM  (Arrive by 11:30 AM)  ALBUMIN 90 with SURTRA INFUSION CHAIR 1  [facility] TRANSPLANT SURGERY [place] [facility] REGION) [address]  [place] [address]  [phone]      Jun 06, 2022 11:00 AM  (Arrive by 10:30 AM)  LVP with [employer] INFUSION CHAIR 3  [facility] TRANSPLANT SURGERY [facility] REGION) [address]  [place] [address]  [phone]      Jun 09, 2022 12:00 PM  (Arrive by 11:30 AM)  ALBUMIN 90 with [employer] INFUSION CHAIR 1  [facility] TRANSPLANT SURGERY [place] [facility] REGION) [address]  [place] [address]  [phone]      Jun 13, 2022 11:00 AM  (Arrive by 10:30 AM)  LVP with [employer] INFUSION CHAIR 3  [facility] TRANSPLANT SURGERY [place] [facility] REGION) [address]  [place] [address]  [phone]      Jun 16, 2022 12:00 PM  (Arrive by 11:30 AM)  ALBUMIN 90 with SURTRA INFUSION CHAIR 1  [facility] TRANSPLANT SURGERY [place] REGION) [address]  [place] [address]  [phone]      Jun 20, 2022 11:00 AM  (Arrive by 10:30 AM)  LVP with [employer] INFUSION CHAIR 3  [facility] TRANSPLANT SURGERY [place] [facility] REGION) [address]  [place] [address]  [phone]      Jun 23, 2022 12:00 PM  (Arrive by 11:30 AM)  ALBUMIN 90 with SURTRA INFUSION CHAIR 1  [facility] TRANSPLANT SURGERY [place] [facility] REGION) [address]  [place] [address]  [phone]      Jun 27, 2022 11:00 AM  (Arrive by 10:30 AM)  LVP with [employer] INFUSION CHAIR 3  [facility] TRANSPLANT SURGERY [place] [facility] REGION) [address]  [place] [address]  [phone]      Jun 30, 2022 12:00 PM  (Arrive by 11:30 AM)  ALBUMIN 90  with [employer] INFUSION CHAIR 1  [facility] TRANSPLANT SURGERY [facility] REGION) [address]  [place] [address]  [phone]      Jul 04, 2022 11:00 AM  (Arrive by 10:30 AM)  LVP with [employer] INFUSION CHAIR 3  [facility] TRANSPLANT SURGERY [place] [facility] REGION) [address]  [place] [address]  [phone]      Jul 07, 2022 12:00 PM  (Arrive by 11:30 AM)  ALBUMIN 90 with SURTRA INFUSION CHAIR 1  [facility] TRANSPLANT SURGERY [place] [facility] REGION) [address]  [place] [address]  [phone]      Jul 11, 2022 11:00 AM  (Arrive by 10:30 AM)  LVP with [employer] INFUSION CHAIR 3  [facility] TRANSPLANT SURGERY [place] [facility] REGION) [address]  [place] [address]  [phone]      Jul 14, 2022 12:00 PM  (Arrive by 11:30 AM)  ALBUMIN 90 with [employer] INFUSION CHAIR 1  [facility] TRANSPLANT SURGERY [facility] REGION) [address]  [place] [address]  [phone]      Jul 18, 2022 11:00 AM  (Arrive by 10:30 AM)  LVP with [employer] INFUSION CHAIR 3  [facility] TRANSPLANT SURGERY [place] [facility] REGION) [address]  [place] [address]  [phone]      Jul 21, 2022 12:00 PM  (Arrive by 11:30 AM)  ALBUMIN 90 with SURTRA INFUSION CHAIR 1  [facility] TRANSPLANT SURGERY [place] [facility] REGION) [address]  [place] [address]  [phone]      Jul 25, 2022 11:00 AM  (Arrive by 10:30 AM)  LVP with [employer] INFUSION CHAIR 3  [facility] TRANSPLANT SURGERY [place] [facility] REGION) [address]  [place] [address]  [phone]      Jul 28, 2022 12:00 PM  (Arrive by 11:30 AM)  ALBUMIN 90 with SURTRA INFUSION CHAIR 1  [facility] TRANSPLANT SURGERY [place] [facility] REGION) [address]  [place] [address]  [phone]      Aug 01, 2022 11:00 AM  (Arrive by 10:30 AM)  LVP with [employer] INFUSION CHAIR 3  [facility] TRANSPLANT SURGERY [place] [facility] REGION) [address]  [place] [address]  [phone]      Aug 04, 2022 12:00 PM  (Arrive by 11:30 AM)  ALBUMIN 90 with SURTRA INFUSION CHAIR 1  [facility] TRANSPLANT SURGERY  [facility] REGION) [address]  [place] [address]  [phone]

## 2022-04-03 DIAGNOSIS — Z7682 Awaiting organ transplant status: Principal | ICD-10-CM

## 2022-04-03 DIAGNOSIS — K7210 Chronic hepatic failure without coma: Principal | ICD-10-CM

## 2022-04-04 NOTE — Unmapped (Signed)
Spoke with patient in detail about his symptoms after reading his [employer].  Advised him to come to [facility] infusion and we would work him in.

## 2022-04-06 DEATH — deceased

## 2022-04-10 DIAGNOSIS — Z7682 Awaiting organ transplant status: Principal | ICD-10-CM

## 2022-04-10 DIAGNOSIS — K7210 Chronic hepatic failure without coma: Principal | ICD-10-CM

## 2022-04-17 DIAGNOSIS — K7210 Chronic hepatic failure without coma: Principal | ICD-10-CM

## 2022-04-17 DIAGNOSIS — Z7682 Awaiting organ transplant status: Principal | ICD-10-CM

## 2022-04-24 DIAGNOSIS — Z7682 Awaiting organ transplant status: Principal | ICD-10-CM

## 2022-04-24 DIAGNOSIS — K7210 Chronic hepatic failure without coma: Principal | ICD-10-CM

## 2022-05-01 DIAGNOSIS — K7210 Chronic hepatic failure without coma: Principal | ICD-10-CM

## 2022-05-01 DIAGNOSIS — Z7682 Awaiting organ transplant status: Principal | ICD-10-CM

## 2022-05-08 DIAGNOSIS — Z7682 Awaiting organ transplant status: Principal | ICD-10-CM

## 2022-05-08 DIAGNOSIS — K7210 Chronic hepatic failure without coma: Principal | ICD-10-CM

## 2022-05-15 DIAGNOSIS — K7210 Chronic hepatic failure without coma: Principal | ICD-10-CM

## 2022-05-15 DIAGNOSIS — Z7682 Awaiting organ transplant status: Principal | ICD-10-CM

## 2022-05-22 DIAGNOSIS — Z7682 Awaiting organ transplant status: Principal | ICD-10-CM

## 2022-05-22 DIAGNOSIS — K7210 Chronic hepatic failure without coma: Principal | ICD-10-CM

## 2022-05-29 DIAGNOSIS — Z7682 Awaiting organ transplant status: Principal | ICD-10-CM

## 2022-05-29 DIAGNOSIS — K7210 Chronic hepatic failure without coma: Principal | ICD-10-CM

## 2022-06-05 DIAGNOSIS — K7210 Chronic hepatic failure without coma: Principal | ICD-10-CM

## 2022-06-05 DIAGNOSIS — Z7682 Awaiting organ transplant status: Principal | ICD-10-CM

## 2022-06-12 DIAGNOSIS — K7210 Chronic hepatic failure without coma: Principal | ICD-10-CM

## 2022-06-12 DIAGNOSIS — Z7682 Awaiting organ transplant status: Principal | ICD-10-CM

## 2022-06-19 DIAGNOSIS — K7210 Chronic hepatic failure without coma: Principal | ICD-10-CM

## 2022-06-19 DIAGNOSIS — Z7682 Awaiting organ transplant status: Principal | ICD-10-CM

## 2022-06-26 DIAGNOSIS — K7210 Chronic hepatic failure without coma: Principal | ICD-10-CM

## 2022-06-26 DIAGNOSIS — Z7682 Awaiting organ transplant status: Principal | ICD-10-CM

## 2022-07-03 DIAGNOSIS — Z7682 Awaiting organ transplant status: Principal | ICD-10-CM

## 2022-07-03 DIAGNOSIS — K7210 Chronic hepatic failure without coma: Principal | ICD-10-CM

## 2022-07-10 DIAGNOSIS — Z7682 Awaiting organ transplant status: Principal | ICD-10-CM

## 2022-07-10 DIAGNOSIS — K7210 Chronic hepatic failure without coma: Principal | ICD-10-CM

## 2022-07-17 DIAGNOSIS — Z7682 Awaiting organ transplant status: Principal | ICD-10-CM

## 2022-07-17 DIAGNOSIS — K7210 Chronic hepatic failure without coma: Principal | ICD-10-CM

## 2022-07-24 DIAGNOSIS — K7210 Chronic hepatic failure without coma: Principal | ICD-10-CM

## 2022-07-24 DIAGNOSIS — Z7682 Awaiting organ transplant status: Principal | ICD-10-CM

## 2022-07-31 DIAGNOSIS — K7210 Chronic hepatic failure without coma: Principal | ICD-10-CM

## 2022-07-31 DIAGNOSIS — Z7682 Awaiting organ transplant status: Principal | ICD-10-CM

## 2022-08-07 DIAGNOSIS — Z7682 Awaiting organ transplant status: Principal | ICD-10-CM

## 2022-08-07 DIAGNOSIS — K7210 Chronic hepatic failure without coma: Principal | ICD-10-CM

## 2022-08-14 DIAGNOSIS — Z7682 Awaiting organ transplant status: Principal | ICD-10-CM

## 2022-08-14 DIAGNOSIS — K7210 Chronic hepatic failure without coma: Principal | ICD-10-CM

## 2022-08-21 DIAGNOSIS — K7210 Chronic hepatic failure without coma: Principal | ICD-10-CM

## 2022-08-21 DIAGNOSIS — Z7682 Awaiting organ transplant status: Principal | ICD-10-CM

## 2022-08-28 DIAGNOSIS — K7210 Chronic hepatic failure without coma: Principal | ICD-10-CM

## 2022-08-28 DIAGNOSIS — Z7682 Awaiting organ transplant status: Principal | ICD-10-CM

## 2022-09-04 DIAGNOSIS — K7210 Chronic hepatic failure without coma: Principal | ICD-10-CM

## 2022-09-04 DIAGNOSIS — Z7682 Awaiting organ transplant status: Principal | ICD-10-CM

## 2022-09-11 DIAGNOSIS — Z7682 Awaiting organ transplant status: Principal | ICD-10-CM

## 2022-09-11 DIAGNOSIS — K7210 Chronic hepatic failure without coma: Principal | ICD-10-CM

## 2022-09-18 DIAGNOSIS — K7210 Chronic hepatic failure without coma: Principal | ICD-10-CM

## 2022-09-18 DIAGNOSIS — Z7682 Awaiting organ transplant status: Principal | ICD-10-CM

## 2022-09-25 DIAGNOSIS — K7210 Chronic hepatic failure without coma: Principal | ICD-10-CM

## 2022-09-25 DIAGNOSIS — Z7682 Awaiting organ transplant status: Principal | ICD-10-CM

## 2022-10-02 DIAGNOSIS — Z7682 Awaiting organ transplant status: Principal | ICD-10-CM

## 2022-10-02 DIAGNOSIS — K7210 Chronic hepatic failure without coma: Principal | ICD-10-CM

## 2022-10-09 DIAGNOSIS — K7210 Chronic hepatic failure without coma: Principal | ICD-10-CM

## 2022-10-09 DIAGNOSIS — Z7682 Awaiting organ transplant status: Principal | ICD-10-CM

## 2022-10-16 DIAGNOSIS — Z7682 Awaiting organ transplant status: Principal | ICD-10-CM

## 2022-10-16 DIAGNOSIS — K7210 Chronic hepatic failure without coma: Principal | ICD-10-CM

## 2022-10-23 DIAGNOSIS — Z7682 Awaiting organ transplant status: Principal | ICD-10-CM

## 2022-10-23 DIAGNOSIS — K7210 Chronic hepatic failure without coma: Principal | ICD-10-CM

## 2022-10-30 DIAGNOSIS — K7210 Chronic hepatic failure without coma: Principal | ICD-10-CM

## 2022-10-30 DIAGNOSIS — Z7682 Awaiting organ transplant status: Principal | ICD-10-CM

## 2022-11-06 DIAGNOSIS — Z7682 Awaiting organ transplant status: Principal | ICD-10-CM

## 2022-11-06 DIAGNOSIS — K7210 Chronic hepatic failure without coma: Principal | ICD-10-CM

## 2022-11-13 DIAGNOSIS — Z7682 Awaiting organ transplant status: Principal | ICD-10-CM

## 2022-11-13 DIAGNOSIS — K7210 Chronic hepatic failure without coma: Principal | ICD-10-CM

## 2022-11-20 DIAGNOSIS — K7210 Chronic hepatic failure without coma: Principal | ICD-10-CM

## 2022-11-20 DIAGNOSIS — Z7682 Awaiting organ transplant status: Principal | ICD-10-CM

## 2022-11-27 DIAGNOSIS — Z7682 Awaiting organ transplant status: Principal | ICD-10-CM

## 2022-11-27 DIAGNOSIS — K7210 Chronic hepatic failure without coma: Principal | ICD-10-CM

## 2022-12-04 DIAGNOSIS — K7210 Chronic hepatic failure without coma: Principal | ICD-10-CM

## 2022-12-04 DIAGNOSIS — Z7682 Awaiting organ transplant status: Principal | ICD-10-CM

## 2022-12-11 DIAGNOSIS — Z7682 Awaiting organ transplant status: Principal | ICD-10-CM

## 2022-12-11 DIAGNOSIS — K7210 Chronic hepatic failure without coma: Principal | ICD-10-CM

## 2022-12-18 DIAGNOSIS — K7210 Chronic hepatic failure without coma: Principal | ICD-10-CM

## 2022-12-18 DIAGNOSIS — Z7682 Awaiting organ transplant status: Principal | ICD-10-CM

## 2022-12-25 DIAGNOSIS — K7210 Chronic hepatic failure without coma: Principal | ICD-10-CM

## 2022-12-25 DIAGNOSIS — Z7682 Awaiting organ transplant status: Principal | ICD-10-CM

## 2023-01-01 DIAGNOSIS — K7210 Chronic hepatic failure without coma: Principal | ICD-10-CM

## 2023-01-01 DIAGNOSIS — Z7682 Awaiting organ transplant status: Principal | ICD-10-CM

## 2023-01-08 DIAGNOSIS — Z7682 Awaiting organ transplant status: Principal | ICD-10-CM

## 2023-01-08 DIAGNOSIS — K7210 Chronic hepatic failure without coma: Principal | ICD-10-CM

## 2023-01-15 DIAGNOSIS — Z7682 Awaiting organ transplant status: Principal | ICD-10-CM

## 2023-01-15 DIAGNOSIS — K7210 Chronic hepatic failure without coma: Principal | ICD-10-CM

## 2023-01-22 DIAGNOSIS — K7210 Chronic hepatic failure without coma: Principal | ICD-10-CM

## 2023-01-22 DIAGNOSIS — Z7682 Awaiting organ transplant status: Principal | ICD-10-CM

## 2023-01-29 DIAGNOSIS — Z7682 Awaiting organ transplant status: Principal | ICD-10-CM

## 2023-01-29 DIAGNOSIS — K7210 Chronic hepatic failure without coma: Principal | ICD-10-CM
